# Patient Record
Sex: Female | Born: 1960 | Race: White | Hispanic: No | Marital: Single | Smoking: Former smoker
Health system: Southern US, Community
[De-identification: ages and names within clinical notes are randomized; demographics above are authoritative.]

## PROBLEM LIST (undated history)

## (undated) DIAGNOSIS — L719 Rosacea, unspecified: Secondary | ICD-10-CM

## (undated) DIAGNOSIS — G43909 Migraine, unspecified, not intractable, without status migrainosus: Secondary | ICD-10-CM

## (undated) DIAGNOSIS — Q8901 Asplenia (congenital): Secondary | ICD-10-CM

## (undated) DIAGNOSIS — R110 Nausea: Secondary | ICD-10-CM

## (undated) DIAGNOSIS — D126 Benign neoplasm of colon, unspecified: Secondary | ICD-10-CM

## (undated) DIAGNOSIS — G541 Lumbosacral plexus disorders: Secondary | ICD-10-CM

## (undated) DIAGNOSIS — IMO0001 Reserved for inherently not codable concepts without codable children: Secondary | ICD-10-CM

## (undated) DIAGNOSIS — M79671 Pain in right foot: Secondary | ICD-10-CM

## (undated) DIAGNOSIS — R9431 Abnormal electrocardiogram [ECG] [EKG]: Secondary | ICD-10-CM

## (undated) DIAGNOSIS — IMO0002 Reserved for concepts with insufficient information to code with codable children: Secondary | ICD-10-CM

## (undated) DIAGNOSIS — Z5189 Encounter for other specified aftercare: Secondary | ICD-10-CM

## (undated) DIAGNOSIS — K589 Irritable bowel syndrome without diarrhea: Secondary | ICD-10-CM

## (undated) DIAGNOSIS — M9250 Juvenile osteochondrosis of tibia and fibula, unspecified leg: Secondary | ICD-10-CM

## (undated) DIAGNOSIS — Z0000 Encounter for general adult medical examination without abnormal findings: Secondary | ICD-10-CM

## (undated) HISTORY — DX: Reserved for concepts with insufficient information to code with codable children: IMO0002

## (undated) HISTORY — PX: WRIST RECONSTRUCTION: SHX2675

## (undated) HISTORY — DX: Encounter for other specified aftercare: Z51.89

## (undated) HISTORY — DX: Reserved for inherently not codable concepts without codable children: IMO0001

## (undated) HISTORY — PX: BUNIONECTOMY: SHX129

## (undated) HISTORY — DX: Irritable bowel syndrome, unspecified: K58.9

## (undated) HISTORY — DX: Lumbosacral plexus disorders: G54.1

## (undated) HISTORY — DX: Abnormal electrocardiogram (ECG) (EKG): R94.31

## (undated) HISTORY — PX: KNEE ARTHROSCOPY: SUR90

## (undated) HISTORY — PX: FOOT SURGERY: SHX648

## (undated) HISTORY — DX: Pain in right foot: M79.671

## (undated) HISTORY — DX: Asplenia (congenital): Q89.01

## (undated) HISTORY — DX: Migraine, unspecified, not intractable, without status migrainosus: G43.909

## (undated) HISTORY — PX: TONSILLECTOMY AND ADENOIDECTOMY: SHX28

## (undated) HISTORY — DX: Benign neoplasm of colon, unspecified: D12.6

## (undated) HISTORY — PX: LAPAROSCOPY: SHX197

## (undated) HISTORY — PX: ANKLE RECONSTRUCTION: SHX1151

## (undated) HISTORY — DX: Encounter for general adult medical examination without abnormal findings: Z00.00

## (undated) HISTORY — PX: AUGMENTATION MAMMAPLASTY: SUR837

## (undated) HISTORY — DX: Rosacea, unspecified: L71.9

## (undated) HISTORY — DX: Juvenile osteochondrosis of tibia and fibula, unspecified leg: M92.50

## (undated) HISTORY — PX: ABDOMINAL HYSTERECTOMY: SHX81

## (undated) HISTORY — PX: BREAST ENHANCEMENT SURGERY: SHX7

## (undated) HISTORY — DX: Nausea: R11.0

---

## 1982-09-27 DIAGNOSIS — IMO0002 Reserved for concepts with insufficient information to code with codable children: Secondary | ICD-10-CM

## 1982-09-27 HISTORY — DX: Reserved for concepts with insufficient information to code with codable children: IMO0002

## 1998-09-27 HISTORY — PX: LAPAROSCOPIC HYSTERECTOMY: SHX1926

## 2006-10-21 ENCOUNTER — Encounter: Admission: RE | Admit: 2006-10-21 | Discharge: 2006-10-21 | Payer: Self-pay | Admitting: *Deleted

## 2006-11-02 ENCOUNTER — Ambulatory Visit (HOSPITAL_BASED_OUTPATIENT_CLINIC_OR_DEPARTMENT_OTHER): Admission: RE | Admit: 2006-11-02 | Discharge: 2006-11-02 | Payer: Self-pay | Admitting: *Deleted

## 2007-01-25 ENCOUNTER — Ambulatory Visit (HOSPITAL_BASED_OUTPATIENT_CLINIC_OR_DEPARTMENT_OTHER): Admission: RE | Admit: 2007-01-25 | Discharge: 2007-01-25 | Payer: Self-pay | Admitting: *Deleted

## 2008-04-09 ENCOUNTER — Encounter: Admission: RE | Admit: 2008-04-09 | Discharge: 2008-04-09 | Payer: Self-pay | Admitting: Family Medicine

## 2008-04-17 ENCOUNTER — Encounter: Admission: RE | Admit: 2008-04-17 | Discharge: 2008-04-17 | Payer: Self-pay | Admitting: Family Medicine

## 2009-04-11 ENCOUNTER — Encounter: Admission: RE | Admit: 2009-04-11 | Discharge: 2009-04-11 | Payer: Self-pay | Admitting: Obstetrics and Gynecology

## 2010-06-16 ENCOUNTER — Encounter: Admission: RE | Admit: 2010-06-16 | Discharge: 2010-06-16 | Payer: Self-pay | Admitting: Obstetrics and Gynecology

## 2010-10-18 ENCOUNTER — Encounter: Payer: Self-pay | Admitting: Orthopedic Surgery

## 2011-02-12 NOTE — Op Note (Signed)
Annette Snow, Annette Snow                ACCOUNT NO.:  [id number]   MEDICAL RECORD NO.:  [id number]           PATIENT TYPE:   LOCATION:                                 FACILITY:   PHYSICIAN:  Tennis Must Meyerdierks, M.D.DATE OF BIRTH:  19-Jan-1961   DATE OF PROCEDURE:  01/25/2007  DATE OF DISCHARGE:                               OPERATIVE REPORT   PREOP DIAGNOSIS:  Status post pinning of scapholunate joint, left wrist.   POSTOP DIAGNOSIS:  Status post pinning of scapholunate joint, left  wrist.   PROCEDURE:  Removal of pins left wrist.   SURGEON:  Lowell Bouton, M.D.   ANESTHESIA:  1/2% Marcaine local with sedation.   OPERATIVE FINDINGS:  The patient had two pins that were found through  separate incisions over the radial side of the wrist.   DESCRIPTION OF PROCEDURE:  Under 1/2% Marcaine local anesthesia with a  tourniquet on the left arm.  The left hand was prepped and draped in  usual fashion; and after elevating the limb the tourniquet was inflated  to 250 mmHg.  A transverse incision was made over the radial side of the  wrist; and carried through the subcutaneous tissues.  Blunt dissection  was carried down to one of the pins which was removed with a needle  holder.   A second transverse incision was made proximal to the first; and blunt  dissection was carried down through the subcutaneous tissues.  The  second pin was then removed, after localizing it with the [facility].  The  wound was irrigated with saline.  The skin was closed with 4-0 nylon.  Sterile dressings were applied followed by a volar wrist splint.  The  tourniquet was released with good circulation of the hand.  The patient  went to the recovery room awake and stable in good condition.      Lowell Bouton, M.D.  Electronically Signed     EMM/MEDQ  D:  01/25/2007  T:  01/25/2007  Job:  [id number]

## 2011-02-12 NOTE — Op Note (Signed)
Annette Snow, Annette Snow                ACCOUNT NO.:  [id number]   MEDICAL RECORD NO.:  [id number]          PATIENT TYPE:  AMB   LOCATION:  DSC                          FACILITY:  [facility]   PHYSICIAN:  Tennis Must Meyerdierks, M.D.DATE OF BIRTH:  1961-01-19   DATE OF PROCEDURE:  11/02/2006  DATE OF DISCHARGE:                               OPERATIVE REPORT   PREOPERATIVE DIAGNOSIS:  Scapholunate ligament tear, left wrist.   POSTOPERATIVE DIAGNOSIS:  Scapholunate ligament tear, left wrist.   PROCEDURE:  Diagnostic arthroscopy with open reduction and pinning of  scapholunate joint, left wrist.   SURGEON:  Lowell Bouton, M.D.   ANESTHESIA:  General augmented with axillary block.   OPERATIVE FINDINGS:  The patient had a jagged tear of the scapholunate  ligament that appeared to be subacute.  The articular surfaces at the  radiocarpal joint and the midcarpal joint were smooth.  The ligament,  itself, was not substantial enough to suture; however, scarring of the  adjacent areas for the ligament to scar down were formed.   DESCRIPTION OF PROCEDURE:  Under general anesthesia with a tourniquet on  the left arm, the left hand was prepped and draped in the usual fashion  and the index and long fingers were suspended from the arthroscopy  tower.  10 pounds of traction was applied across the wrist and 5 mL of  saline were inserted in the radiocarpal joint.  An outflow was  established at the 6U portal.  The arthroscope was then inserted at the  3/4 portal.  The arthroscope was placed in the radiocarpal joint and the  scapholunate ligament tear was easily observed.  It appeared as though  it was a subacute injury and not an old tear.  The patient was nine  weeks from her injury.  The scope was then brought through the  radiocarpal joint which showed no evidence of arthrosis.  The scope was  brought through the scapholunate tear to the midcarpal joint to see the  proximal capitate and  the joint surfaces were smooth.   The arthroscopy was then discontinued and the hand was removed from this  tower.  It was exsanguinated and the tourniquet was inflated to 250  mmHg.  A longitudinal incision was then made between the third and  fourth dorsal compartments on the wrist.  Blunt dissection was carried  through the subcutaneous tissues and bleeding points were coagulated.  The third dorsal compartment was released and the joint was entered  through an arthrotomy at the radiocarpal joint.  A Freer elevator was  inserted in the joint and with distraction, the scapholunate tear was  easily observed.  The capitate was migrated proximally.  A 4/5 K-wire  was placed in the lunate, used as a joystick, and the scapholunate  interval was reduced.  Looking at the bone, the ligament was felt to be  appropriately aligned and was too small really to use any sutures on  both sides.  The bone anchors that were available were quite large and  it was not felt that they would be of benefit.  A  4/5 K-wire x2 was  placed percutaneously across the scapholunate joint holding the joint  reduced.  This allowed the ligament to line up from the lunate to the  scaphoid appropriately and so a 4/5 K-wires used to roughen up the  surface of the scaphoid at the attachment of the ligament.  This was  done to allow scarring there to have the ligament to heal down to the  scaphoid.  X-rays showed good alignment and the pins were cut beneath  the skin.   The wound was then irrigated copiously with saline and the dorsal  capsular tissue was closed with 4-0 Vicryl and the third dorsal  compartment was closed with 4-0 Vicryl.  The subcu was closed with 4-0  Vicryl.  A vessel loop  drain was left in for drainage.  The skin was closed with a 3-0  subcuticular Prolene.  Steri-Strips were applied followed by sterile  dressings and volar and dorsal splint.  The patient had the tourniquet  released with good  circulation of her hand.  She went to the recovery  room awake and stable in good condition.      Lowell Bouton, M.D.  Electronically Signed     EMM/MEDQ  D:  11/02/2006  T:  11/02/2006  Job:  [id number]

## 2011-05-26 ENCOUNTER — Encounter: Payer: Self-pay | Admitting: Internal Medicine

## 2011-05-26 ENCOUNTER — Other Ambulatory Visit: Payer: Self-pay | Admitting: Obstetrics & Gynecology

## 2011-05-26 DIAGNOSIS — Z780 Asymptomatic menopausal state: Secondary | ICD-10-CM

## 2011-06-16 ENCOUNTER — Ambulatory Visit (AMBULATORY_SURGERY_CENTER): Payer: Managed Care, Other (non HMO) | Admitting: *Deleted

## 2011-06-16 VITALS — Ht 64.0 in | Wt 125.0 lb

## 2011-06-16 DIAGNOSIS — Z1211 Encounter for screening for malignant neoplasm of colon: Secondary | ICD-10-CM

## 2011-06-16 MED ORDER — PEG-KCL-NACL-NASULF-NA ASC-C 100 G PO SOLR: ORAL | Status: DC

## 2011-06-16 NOTE — Progress Notes (Signed)
[name] states she's had extensive laparoscopies to clean endometriosis off the outside of her bowel and other internal organs and that she also had a colon to clean endometriosis of the inside of her large bowel.

## 2011-06-28 ENCOUNTER — Other Ambulatory Visit: Payer: Self-pay | Admitting: Obstetrics & Gynecology

## 2011-06-28 DIAGNOSIS — Z1231 Encounter for screening mammogram for malignant neoplasm of breast: Secondary | ICD-10-CM

## 2011-06-30 ENCOUNTER — Ambulatory Visit (AMBULATORY_SURGERY_CENTER): Payer: Managed Care, Other (non HMO) | Admitting: Internal Medicine

## 2011-06-30 ENCOUNTER — Encounter: Payer: Self-pay | Admitting: Internal Medicine

## 2011-06-30 VITALS — HR 77 | Temp 97.0°F | Resp 18 | Ht 64.0 in | Wt 125.0 lb

## 2011-06-30 DIAGNOSIS — D126 Benign neoplasm of colon, unspecified: Secondary | ICD-10-CM

## 2011-06-30 DIAGNOSIS — Z1211 Encounter for screening for malignant neoplasm of colon: Secondary | ICD-10-CM

## 2011-06-30 DIAGNOSIS — K635 Polyp of colon: Secondary | ICD-10-CM

## 2011-06-30 LAB — HM COLONOSCOPY

## 2011-06-30 MED ORDER — SODIUM CHLORIDE 0.9 % IV SOLN: 500.0000 mL | INTRAVENOUS | Status: DC

## 2011-06-30 NOTE — Patient Instructions (Signed)
Please refer to your blue and neon green sheets for instructions regarding diet and activity for the rest of today.  You may resume your medications as you would normally take them.   You will receive a letter in the mail in about two weeks regarding the results of your biopsies taken today.  Diverticulosis Diverticulosis is a common condition that develops when small pouches (diverticula) form in the wall of the colon. The risk of diverticulosis increases with age. It happens more often in people who eat a low-fiber diet. Most individuals with diverticulosis have no symptoms. Those individuals with symptoms usually experience belly (abdominal) pain, constipation, or loose stools (diarrhea). HOME CARE INSTRUCTIONS  Increase the amount of fiber in your diet as directed by your caregiver or dietician. This may reduce symptoms of diverticulosis.   Your caregiver may recommend taking a dietary fiber supplement.   Drink at least 6 to 8 glasses of water each day to prevent constipation.   Try not to strain when you have a bowel movement.   Your caregiver may recommend avoiding nuts and seeds to prevent complications, although this is still an uncertain benefit.   Only take over-the-counter or prescription medicines for pain, discomfort, or fever as directed by your caregiver.  FOODS HAVING HIGH FIBER CONTENT INCLUDE:  Fruits. Apple, peach, pear, tangerine, raisins, prunes.   Vegetables. Brussels sprouts, asparagus, broccoli, cabbage, carrot, cauliflower, romaine lettuce, spinach, summer squash, tomato, winter squash, zucchini.   Starchy Vegetables. Baked beans, kidney beans, lima beans, split peas, lentils, potatoes (with skin).   Grains. Whole wheat bread, brown rice, bran flake cereal, plain oatmeal, white rice, shredded wheat, bran muffins.  SEEK IMMEDIATE MEDICAL CARE IF:  You develop increasing pain or severe bloating.   You have an increased oral temperature, not controlled by  medicine.   You develop vomiting or bowel movements that are bloody or black.  Document Released: 06/10/2004 Document Re-Released: 03/03/2010 [facility] Patient Information 2011 [place], [place].  Polyps, Colon  A polyp is extra tissue that grows inside your body. Colon polyps grow in the large intestine. The large intestine, also called the colon, is part of your digestive system. It is a long, hollow tube at the end of your digestive tract where your body makes and stores stool. Most polyps are not dangerous. They are benign. This means they are not cancerous. But over time, some types of polyps can turn into cancer. Polyps that are smaller than a pea are usually not harmful. But larger polyps could someday become or may already be cancerous. To be safe, doctors remove all polyps and test them.  WHO GETS POLYPS? Anyone can get polyps, but certain people are more likely than others. You may have a greater chance of getting polyps if:  You are over 50.   You have had polyps before.   Someone in your family has had polyps.   Someone in your family has had cancer of the large intestine.   Find out if someone in your family has had polyps. You may also be more likely to get polyps if you:   Eat a lot of fatty foods   Smoke   Drink alcohol   Do not exercise  Eat too much  SYMPTOMS Most small polyps do not cause symptoms. People often do not know they have one until their caregiver finds it during a regular checkup or while testing them for something else. Some people do have symptoms like these:  Bleeding from the anus.  You might notice blood on your underwear or on toilet paper after you have had a bowel movement.   Constipation or diarrhea that lasts more than a week.   Blood in the stool. Blood can make stool look black or it can show up as red streaks in the stool.  If you have any of these symptoms, see your caregiver. HOW DOES THE DOCTOR TEST FOR POLYPS? The doctor can use four  tests to check for polyps:  Digital rectal exam. The caregiver wears gloves and checks your rectum (the last part of the large intestine) to see if it feels normal. This test would find polyps only in the rectum. Your caregiver may need to do one of the other tests listed below to find polyps higher up in the intestine.   Barium enema. The caregiver puts a liquid called barium into your rectum before taking x-rays of your large intestine. Barium makes your intestine look white in the pictures. Polyps are dark, so they are easy to see.   Sigmoidoscopy. With this test, the caregiver can see inside your large intestine. A thin flexible tube is placed into your rectum. The device is called a sigmoidoscope, which has a light and a tiny video camera in it. The caregiver uses the sigmoidoscope to look at the last third of your large intestine.   Colonoscopy. This test is like sigmoidoscopy, but the caregiver looks at all of the large intestine. It usually requires sedation. This is the most common method for finding and removing polyps.  TREATMENT  The caregiver will remove the polyp during sigmoidoscopy or colonoscopy. The polyp is then tested for cancer.   If you have had polyps, your caregiver may want you to get tested regularly in the future.  PREVENTION There is not one sure way to prevent polyps. You might be able to lower your risk of getting them if you:  Eat more fruits and vegetables and less fatty food.   Do not smoke.   Avoid alcohol.   Exercise every day.   Lose weight if you are overweight.   Eating more calcium and folate can also lower your risk of getting polyps. Some foods that are rich in calcium are milk, cheese, and broccoli. Some foods that are rich in folate are chickpeas, kidney beans, and spinach.   Aspirin might help prevent polyps. Studies are under way.  Document Released: 06/09/2004 Document Re-Released: 03/03/2010 [facility] Patient Information 2011 [place],  [place].

## 2011-07-01 ENCOUNTER — Emergency Department (HOSPITAL_BASED_OUTPATIENT_CLINIC_OR_DEPARTMENT_OTHER): Payer: Managed Care, Other (non HMO)

## 2011-07-01 ENCOUNTER — Inpatient Hospital Stay (HOSPITAL_COMMUNITY)
Admission: EM | Admit: 2011-07-01 | Discharge: 2011-07-09 | DRG: 799 | Disposition: A | Discharge status: Home / Self Care | Payer: Managed Care, Other (non HMO) | Source: Ambulatory Visit | Attending: Surgery | Admitting: Surgery

## 2011-07-01 ENCOUNTER — Telehealth: Payer: Self-pay | Admitting: *Deleted

## 2011-07-01 ENCOUNTER — Other Ambulatory Visit (INDEPENDENT_AMBULATORY_CARE_PROVIDER_SITE_OTHER): Payer: Self-pay | Admitting: Surgery

## 2011-07-01 ENCOUNTER — Emergency Department (INDEPENDENT_AMBULATORY_CARE_PROVIDER_SITE_OTHER): Payer: Managed Care, Other (non HMO)

## 2011-07-01 ENCOUNTER — Encounter (HOSPITAL_BASED_OUTPATIENT_CLINIC_OR_DEPARTMENT_OTHER): Payer: Self-pay | Admitting: *Deleted

## 2011-07-01 ENCOUNTER — Telehealth: Payer: Self-pay | Admitting: Internal Medicine

## 2011-07-01 ENCOUNTER — Emergency Department (HOSPITAL_BASED_OUTPATIENT_CLINIC_OR_DEPARTMENT_OTHER)
Admission: EM | Admit: 2011-07-01 | Discharge: 2011-07-01 | Disposition: A | Discharge status: Other Acute Inpatient Hospital | Payer: Managed Care, Other (non HMO) | Source: Home / Self Care | Attending: Emergency Medicine | Admitting: Emergency Medicine

## 2011-07-01 DIAGNOSIS — D7389 Other diseases of spleen: Principal | ICD-10-CM | POA: Diagnosis present

## 2011-07-01 DIAGNOSIS — K661 Hemoperitoneum: Secondary | ICD-10-CM | POA: Diagnosis present

## 2011-07-01 DIAGNOSIS — X58XXXA Exposure to other specified factors, initial encounter: Secondary | ICD-10-CM | POA: Insufficient documentation

## 2011-07-01 DIAGNOSIS — K589 Irritable bowel syndrome without diarrhea: Secondary | ICD-10-CM | POA: Insufficient documentation

## 2011-07-01 DIAGNOSIS — E876 Hypokalemia: Secondary | ICD-10-CM | POA: Diagnosis not present

## 2011-07-01 DIAGNOSIS — S3609XA Other injury of spleen, initial encounter: Secondary | ICD-10-CM | POA: Insufficient documentation

## 2011-07-01 DIAGNOSIS — Y836 Removal of other organ (partial) (total) as the cause of abnormal reaction of the patient, or of later complication, without mention of misadventure at the time of the procedure: Secondary | ICD-10-CM | POA: Diagnosis present

## 2011-07-01 DIAGNOSIS — S36039A Unspecified laceration of spleen, initial encounter: Secondary | ICD-10-CM

## 2011-07-01 DIAGNOSIS — K929 Disease of digestive system, unspecified: Secondary | ICD-10-CM | POA: Diagnosis not present

## 2011-07-01 DIAGNOSIS — S3600XA Unspecified injury of spleen, initial encounter: Secondary | ICD-10-CM

## 2011-07-01 DIAGNOSIS — R579 Shock, unspecified: Secondary | ICD-10-CM

## 2011-07-01 DIAGNOSIS — Z87891 Personal history of nicotine dependence: Secondary | ICD-10-CM

## 2011-07-01 DIAGNOSIS — R578 Other shock: Secondary | ICD-10-CM | POA: Diagnosis present

## 2011-07-01 DIAGNOSIS — D62 Acute posthemorrhagic anemia: Secondary | ICD-10-CM | POA: Diagnosis present

## 2011-07-01 DIAGNOSIS — Y9210 Unspecified residential institution as the place of occurrence of the external cause: Secondary | ICD-10-CM | POA: Diagnosis present

## 2011-07-01 DIAGNOSIS — G43909 Migraine, unspecified, not intractable, without status migrainosus: Secondary | ICD-10-CM | POA: Diagnosis present

## 2011-07-01 DIAGNOSIS — R109 Unspecified abdominal pain: Secondary | ICD-10-CM

## 2011-07-01 DIAGNOSIS — Z23 Encounter for immunization: Secondary | ICD-10-CM

## 2011-07-01 DIAGNOSIS — K560 Paralytic ileus: Secondary | ICD-10-CM | POA: Diagnosis not present

## 2011-07-01 DIAGNOSIS — N809 Endometriosis, unspecified: Secondary | ICD-10-CM | POA: Diagnosis present

## 2011-07-01 DIAGNOSIS — Z860100 Personal history of colon polyps, unspecified: Secondary | ICD-10-CM

## 2011-07-01 HISTORY — PX: SPLENECTOMY: SUR1306

## 2011-07-01 LAB — CBC
HCT: 31 % — ABNORMAL LOW (ref 36.0–46.0)
HCT: 23.2 % — ABNORMAL LOW (ref 36.0–46.0)
Hemoglobin: 10.8 g/dL — ABNORMAL LOW (ref 12.0–15.0)
Hemoglobin: 8.1 g/dL — ABNORMAL LOW (ref 12.0–15.0)
MCH: 31.5 pg (ref 26.0–34.0)
MCH: 31.4 pg (ref 26.0–34.0)
MCHC: 34.8 g/dL (ref 30.0–36.0)
MCV: 90.4 fL (ref 78.0–100.0)
MCV: 89.9 fL (ref 78.0–100.0)
Platelets: 156 K/uL (ref 150–400)
RBC: 3.43 MIL/uL — ABNORMAL LOW (ref 3.87–5.11)
RBC: 2.58 MIL/uL — ABNORMAL LOW (ref 3.87–5.11)
RDW: 11.2 % — ABNORMAL LOW (ref 11.5–15.5)
WBC: 9.3 K/uL (ref 4.0–10.5)

## 2011-07-01 LAB — DIFFERENTIAL
Basophils Absolute: 0 10*3/uL (ref 0.0–0.1)
Lymphs Abs: 1.7 10*3/uL (ref 0.7–4.0)
Monocytes Relative: 5 % (ref 3–12)
Neutro Abs: 7 10*3/uL (ref 1.7–7.7)
Neutrophils Relative %: 75 % (ref 43–77)

## 2011-07-01 LAB — COMPREHENSIVE METABOLIC PANEL
ALT: 13 U/L (ref 0–35)
AST: 14 U/L (ref 0–37)
Albumin: 3.3 g/dL — ABNORMAL LOW (ref 3.5–5.2)
Alkaline Phosphatase: 41 U/L (ref 39–117)
CO2: 24 mEq/L (ref 19–32)
Chloride: 105 mEq/L (ref 96–112)
GFR calc non Af Amer: >90 mL/min (ref >90)
Potassium: 3.3 mEq/L — ABNORMAL LOW (ref 3.5–5.1)
Sodium: 138 mEq/L (ref 135–145)
Total Bilirubin: 0.8 mg/dL (ref 0.3–1.2)

## 2011-07-01 LAB — POCT I-STAT 4, (NA,K, GLUC, HGB,HCT)
Glucose, Bld: 167 mg/dL — ABNORMAL HIGH (ref 70–99)
HCT: 34 % — ABNORMAL LOW (ref 36.0–46.0)

## 2011-07-01 MED ORDER — HYDROMORPHONE HCL 1 MG/ML IJ SOLN
1.0000 mg | Freq: Once | INTRAMUSCULAR | Status: AC
  Administered 2011-07-01: 1 mg via INTRAVENOUS

## 2011-07-01 MED ORDER — FENTANYL CITRATE 0.05 MG/ML IJ SOLN
50.0000 ug | Freq: Once | INTRAMUSCULAR | Status: AC
  Administered 2011-07-01: 19:00:00 via INTRAVENOUS

## 2011-07-01 MED ORDER — PIPERACILLIN-TAZOBACTAM 3.375 G IVPB
INTRAVENOUS | Status: AC
  Filled 2011-07-01: qty 50

## 2011-07-01 MED ORDER — SODIUM CHLORIDE 0.9 % IV BOLUS (SEPSIS)
1000.0000 mL | Freq: Once | INTRAVENOUS | Status: AC
  Administered 2011-07-01: 1000 mL via INTRAVENOUS

## 2011-07-01 MED ORDER — PIPERACILLIN-TAZOBACTAM 3.375 G IVPB
3.3750 g | Freq: Once | INTRAVENOUS | Status: DC
  Administered 2011-07-01: 3.375 g via INTRAVENOUS

## 2011-07-01 MED ORDER — ONDANSETRON HCL 4 MG/2ML IJ SOLN
4.0000 mg | Freq: Once | INTRAMUSCULAR | Status: AC
  Administered 2011-07-01: 4 mg via INTRAVENOUS

## 2011-07-01 MED ORDER — IOHEXOL 300 MG/ML  SOLN: 100.0000 mL | Freq: Once | INTRAMUSCULAR | Status: DC | PRN

## 2011-07-01 MED ORDER — HYDROMORPHONE HCL 1 MG/ML IJ SOLN
INTRAMUSCULAR | Status: AC
  Filled 2011-07-01: qty 1

## 2011-07-01 MED ORDER — FENTANYL CITRATE 0.05 MG/ML IJ SOLN
INTRAMUSCULAR | Status: AC
  Filled 2011-07-01: qty 2

## 2011-07-01 MED ORDER — ONDANSETRON HCL 4 MG/2ML IJ SOLN
INTRAMUSCULAR | Status: AC
  Filled 2011-07-01: qty 2

## 2011-07-01 MED ORDER — ONDANSETRON HCL 4 MG/2ML IJ SOLN
INTRAMUSCULAR | Status: AC
  Administered 2011-07-01: 4 mg
  Filled 2011-07-01: qty 2

## 2011-07-01 NOTE — ED Notes (Signed)
Decreased bp md aware and emergency blood ordered, and started

## 2011-07-01 NOTE — ED Notes (Signed)
Pt states colonoscopy done yesterday, today sudden onset of abd pain and left shoulder pain

## 2011-07-01 NOTE — ED Provider Notes (Signed)
History     CSN: [id number] Arrival date & time: 07/01/2011  5:27 PM  Chief Complaint  Patient presents with  . Abdominal Pain    (Consider location/radiation/quality/duration/timing/severity/associated sxs/prior treatment) Patient is a 50 y.o. female presenting with abdominal pain. The history is provided by the patient. A language interpreter was used.  Abdominal Pain The primary symptoms of the illness include abdominal pain and nausea. The current episode started 6 to 12 hours ago. The onset of the illness was sudden. The problem has been gradually worsening.  Associated with: surgery. The patient states that she believes she is currently not pregnant. The patient has not had a change in bowel habit. Risk factors for an acute abdominal problem include a history of abdominal surgery. Additional symptoms associated with the illness include chills. Significant associated medical issues do not include diabetes, gallstones or liver disease.  Pt complains ofsudden onset around 4 am of pain in left abdomen that radiates to left shoulder.  Pt had a colonoscopy done yesterday by Dr. Juanda Chance.  Pt reports colonoscopy was routine   Past Medical History  Diagnosis Date  . Ulcer 1984    petic and duodenal  . IBS (irritable bowel syndrome)   . Migraines   . Hemorrhoids     Past Surgical History  Procedure Date  . Laparoscopic hysterectomy 2000    total for extensive endometriosis  . Bunionectomy     bilateral  . Ankle reconstruction   . Knee arthroscopy     bilateral  3 on left 2 on right  . Breast enhancement surgery   . Wrist reconstruction     left twice  . Foot surgery     left twice  . Tonsillectomy and adenoidectomy   . Laparoscopy     multiple times for endometriosis on all organs  . Abdominal hysterectomy     History reviewed. No pertinent family history.  History  Substance Use Topics  . Smoking status: Former Games developer  . Smokeless tobacco: Never Used  . Alcohol Use:  3.6 oz/week    6 Cans of beer per week    OB History    Grav Para Term Preterm Abortions TAB SAB Ect Mult Living                  Review of Systems  Constitutional: Positive for chills.  Gastrointestinal: Positive for nausea, abdominal pain and abdominal distention.  All other systems reviewed and are negative.    Allergies  Nucynta  Home Medications   Current Outpatient Rx  Name Route Sig Dispense Refill  . VIVELLE-DOT 0.1 MG/24HR TD PTTW Transdermal Place 1 patch onto the skin 2 (two) times a week.      BP 94/76  Pulse 76  Temp(Src) 97.5 F (36.4 C) (Oral)  Resp 20  Ht 5\' 6"  (1.676 m)  Wt 124 lb (56.246 kg)  BMI 20.01 kg/m2  SpO2 100%  Physical Exam  Nursing note and vitals reviewed. Constitutional: She is oriented to person, place, and time. She appears well-developed and well-nourished.  HENT:  Head: Normocephalic and atraumatic.  Eyes: Pupils are equal, round, and reactive to light.  Neck: Normal range of motion.  Cardiovascular: Normal rate.   Pulmonary/Chest: Effort normal and breath sounds normal.  Abdominal: Soft. There is tenderness. There is rebound and guarding.  Musculoskeletal: Normal range of motion.  Neurological: She is alert and oriented to person, place, and time.  Skin: Skin is warm.  Psychiatric: She has a normal mood and  affect.    ED Course  CRITICAL CARE Performed by: Langston Masker Authorized by: Langston Masker Total critical care time: 45 minutes Critical care was necessary to treat or prevent imminent or life-threatening deterioration of the following conditions: shock (internal hemmorhage). Critical care was time spent personally by me on the following activities: discussions with consultants, development of treatment plan with patient or surrogate, ordering and performing treatments and interventions, examination of patient, ordering and review of radiographic studies, re-evaluation of patient's condition, review of old charts,  ordering and review of laboratory studies, pulse oximetry, evaluation of patient's response to treatment and obtaining history from patient or surrogate. Comments: Dr. Fredricka Bonine notified of pt's condition and we were at bedside during pt's stay in ED.   (including critical care time)  Labs Reviewed  CBC - Abnormal; Notable for the following:    RBC 3.43 (*)    Hemoglobin 10.8 (*)    HCT 31.0 (*)    RDW 11.2 (*)    All other components within normal limits  COMPREHENSIVE METABOLIC PANEL - Abnormal; Notable for the following:    Potassium 3.3 (*)    Glucose, Bld 155 (*)    Total Protein 5.4 (*)    Albumin 3.3 (*)    All other components within normal limits  DIFFERENTIAL  TYPE AND SCREEN  PREPARE RBC (CROSSMATCH)  PREPARE FRESH FROZEN PLASMA   Ct Abdomen Pelvis W Contrast  07/01/2011  *RADIOLOGY REPORT*  Clinical Data: Today, now with sudden onset of abdominal pain, hypertension  CT ABDOMEN AND PELVIS WITH CONTRAST  Technique:  Multidetector CT imaging of the abdomen and pelvis was performed following the standard protocol during bolus administration of intravenous contrast.  Contrast:  100 ml Omnipaque-300  Comparison: None.  Findings:  There is a large amount of fluid within the abdominal cavity which demonstrates Hounsfield units compatible with blood.  This finding is associated with apparent disruption of the anterior aspect of the splenic capsule (image 22, series 2).  Serpiginous high density material is seen about the splenic hilum which accumulates on the acquired delayed images (image 22, series 2 and image 7, series 7), compatible with active contrast extravasation.  Normal hepatic contour. Calcified hepatic lesion compatible with prior granulomatous infection.  Normal gallbladder.  No intrahepatic biliary duct dilatation.  Patent vasculature is patent. Incidental note is made of several splenic granulomas, the sequela of prior granulomatous infection.  There is symmetric enhancement  excretion of the bilateral kidneys. No evidence of urinary obstruction.  No retroperitoneal hematoma. The bilateral adrenal glands are normal.  Normal pancreas.  The bowel is normal in course and caliber without wall thickening or evidence of obstruction.  The appendix is not definitively identified.  No pneumoperitoneum, pneumatosis or portal venous gas. Normal caliber abdominal aorta.  No retroperitoneal or definite evidence of minute mesenteric, pelvic or inguinal lymphadenopathy.  Limited visualization of the lower thorax demonstrates a granuloma loss within the right and left lower lobes.  Bibasilar ground-glass atelectasis.  No pleural effusions.  No focal airspace opacities. Normal heart size.  No pericardial effusion.  No acute aggressive osseous abnormalities.  L4 - L5 and L5 - S1 DDD.  Post bilateral breast augmentation.  IMPRESSION:  Splenic laceration with moderate to large amount of hemoperitoneum and contrast pooling within the splenic hilum compatible with active extravasation.  Above findings discussed with Sophia, PA at 18 33.  Original Report Authenticated By: Waynard Reeds, M.D.     1. Splenic laceration  2. Hemoperitoneum       MDM  Radiologist reports pt is bleeding from splenic laceration.  Dr. Fredricka Bonine spoke with Dr. Michaell Cowing General Surgeon who advised transfer pt to [facility] OR.  I spoke with Dr. Marina Goodell on call for Dr. Juanda Chance.   Pt transferred by [facility].          Langston Masker, [place] 07/01/11 [phone]

## 2011-07-01 NOTE — ED Notes (Signed)
Called [facility]--they had no truck for transfer--told to call 911-i did and a truck got here within 15 minutes

## 2011-07-01 NOTE — ED Notes (Signed)
[facility] here for transport , waiting for location of transport

## 2011-07-01 NOTE — Telephone Encounter (Signed)
Spoke with patient;s husband about her symptoms. He said that the paramedics just arrived to her house. Dr. Juanda Chance spoke with husband and advised to go to [facility] ER. Husband verbalized understanding.

## 2011-07-01 NOTE — ED Provider Notes (Signed)
History     CSN: [id number] Arrival date & time: 07/01/2011  5:27 PM  Chief Complaint  Patient presents with  . Abdominal Pain    (Consider location/radiation/quality/duration/timing/severity/associated sxs/prior treatment) HPI  Past Medical History  Diagnosis Date  . Ulcer 1984    petic and duodenal  . IBS (irritable bowel syndrome)   . Migraines   . Hemorrhoids     Past Surgical History  Procedure Date  . Laparoscopic hysterectomy 2000    total for extensive endometriosis  . Bunionectomy     bilateral  . Ankle reconstruction   . Knee arthroscopy     bilateral  3 on left 2 on right  . Breast enhancement surgery   . Wrist reconstruction     left twice  . Foot surgery     left twice  . Tonsillectomy and adenoidectomy   . Laparoscopy     multiple times for endometriosis on all organs  . Abdominal hysterectomy     History reviewed. No pertinent family history.  History  Substance Use Topics  . Smoking status: Former Games developer  . Smokeless tobacco: Never Used  . Alcohol Use: 3.6 oz/week    6 Cans of beer per week    OB History    Grav Para Term Preterm Abortions TAB SAB Ect Mult Living                  Review of Systems  Allergies  Nucynta  Home Medications   Current Outpatient Rx  Name Route Sig Dispense Refill  . VIVELLE-DOT 0.1 MG/24HR TD PTTW Transdermal Place 1 patch onto the skin 2 (two) times a week.      BP 94/76  Pulse 76  Temp(Src) 97.5 F (36.4 C) (Oral)  Resp 20  Ht 5\' 6"  (1.676 m)  Wt 124 lb (56.246 kg)  BMI 20.01 kg/m2  SpO2 100%  Physical Exam  ED Course  Procedures (including critical care time)  Labs Reviewed  CBC - Abnormal; Notable for the following:    RBC 3.43 (*)    Hemoglobin 10.8 (*)    HCT 31.0 (*)    RDW 11.2 (*)    All other components within normal limits  COMPREHENSIVE METABOLIC PANEL - Abnormal; Notable for the following:    Potassium 3.3 (*)    Glucose, Bld 155 (*)    Total Protein 5.4 (*)    Albumin 3.3 (*)    All other components within normal limits  DIFFERENTIAL   No results found.   No diagnosis found.    MDM  The patient was seen by me in 2 to her hypotension and severe abdominal pain. On CT scan she appears to have a splenic laceration with hemoperitoneum and pooling of contrast in the spleen such as suggestive of a fairly brisk bleeding. Her blood pressure remained hypotensive although she is not tachycardic and she is awake alert and oriented. Because of the suspected large blood loss we will have to give emergency transfusion of on and crossed blood pending emergent transfer to surgery at [facility]. [facility] awaiting return call from the surgeon to give information of the case, however we will need to expedite transport emergently. The patient and her spouse have been informed of the diagnosis and treatment plan and state their understanding of and agreement with that.        Felisa Bonier, MD 07/01/11 [phone]

## 2011-07-01 NOTE — Telephone Encounter (Signed)
Left mess. On voicemail with id

## 2011-07-01 NOTE — ED Notes (Signed)
repaging dr. Alexandria Lodge

## 2011-07-02 LAB — BASIC METABOLIC PANEL
CO2: 23 mEq/L (ref 19–32)
Chloride: 112 mEq/L (ref 96–112)
Glucose, Bld: 152 mg/dL — ABNORMAL HIGH (ref 70–99)
Potassium: 4.3 mEq/L (ref 3.5–5.1)
Sodium: 139 mEq/L (ref 135–145)

## 2011-07-02 LAB — CROSSMATCH: Unit division: 0

## 2011-07-02 LAB — CBC
HCT: 36.2 % (ref 36.0–46.0)
Hemoglobin: 13 g/dL (ref 12.0–15.0)
MCH: 30.9 pg (ref 26.0–34.0)
MCH: 31.6 pg (ref 26.0–34.0)
MCHC: 35.9 g/dL (ref 30.0–36.0)
MCV: 86 fL (ref 78.0–100.0)
MCV: 87.6 fL (ref 78.0–100.0)
Platelets: 84 10*3/uL — ABNORMAL LOW (ref 150–400)
RBC: 4.21 MIL/uL (ref 3.87–5.11)

## 2011-07-02 LAB — PROTIME-INR: Prothrombin Time: 17.1 seconds — ABNORMAL HIGH (ref 11.6–15.2)

## 2011-07-02 LAB — HEMOGLOBIN AND HEMATOCRIT, BLOOD: HCT: 35.3 % — ABNORMAL LOW (ref 36.0–46.0)

## 2011-07-03 LAB — CBC
HCT: 29.8 % — ABNORMAL LOW (ref 36.0–46.0)
Hemoglobin: 10.5 g/dL — ABNORMAL LOW (ref 12.0–15.0)
RBC: 3.41 MIL/uL — ABNORMAL LOW (ref 3.87–5.11)
RDW: 14.3 % (ref 11.5–15.5)
WBC: 15.8 10*3/uL — ABNORMAL HIGH (ref 4.0–10.5)

## 2011-07-03 LAB — BASIC METABOLIC PANEL
BUN: 5 mg/dL — ABNORMAL LOW (ref 6–23)
CO2: 29 mEq/L (ref 19–32)
Chloride: 106 mEq/L (ref 96–112)
GFR calc Af Amer: >90 mL/min (ref >90)
Glucose, Bld: 132 mg/dL — ABNORMAL HIGH (ref 70–99)
Potassium: 3.8 mEq/L (ref 3.5–5.1)

## 2011-07-05 ENCOUNTER — Encounter: Payer: Self-pay | Admitting: Internal Medicine

## 2011-07-05 LAB — CBC
HCT: 26.1 % — ABNORMAL LOW (ref 36.0–46.0)
Hemoglobin: 9.1 g/dL — ABNORMAL LOW (ref 12.0–15.0)
MCHC: 34.9 g/dL (ref 30.0–36.0)

## 2011-07-05 LAB — PREPARE FRESH FROZEN PLASMA
Unit division: 0
Unit division: 0

## 2011-07-06 LAB — COMPREHENSIVE METABOLIC PANEL
CO2: 29 mEq/L (ref 19–32)
Calcium: 7.9 mg/dL — ABNORMAL LOW (ref 8.4–10.5)
Creatinine, Ser: <0.47 mg/dL — ABNORMAL LOW (ref 0.50–1.10)
Glucose, Bld: 103 mg/dL — ABNORMAL HIGH (ref 70–99)

## 2011-07-06 LAB — CBC
Hemoglobin: 8.8 g/dL — ABNORMAL LOW (ref 12.0–15.0)
MCH: 30.9 pg (ref 26.0–34.0)
MCV: 89.5 fL (ref 78.0–100.0)
Platelets: 240 10*3/uL (ref 150–400)
RBC: 2.85 MIL/uL — ABNORMAL LOW (ref 3.87–5.11)

## 2011-07-06 LAB — MAGNESIUM: Magnesium: 1.8 mg/dL (ref 1.5–2.5)

## 2011-07-06 NOTE — Op Note (Signed)
NAMEBECKETT, HICKMON                ACCOUNT NO.:  [id number]  MEDICAL RECORD NO.:  [id number]  LOCATION:  [facility]                         FACILITY:  [facility]  PHYSICIAN:  Maisie Fus A. Lander Eslick, M.D.DATE OF BIRTH:  Jul 26, 1961  DATE OF PROCEDURE:  07/01/2011 DATE OF DISCHARGE:                              OPERATIVE REPORT   PREOPERATIVE DIAGNOSES: 1. Hemoperitoneum shock. 2. Ruptured spleen.  POSTOPERATIVE DIAGNOSES: 1. Hemoperitoneum shock. 2. Ruptured spleen.  PROCEDURE:  Splenectomy.  SURGEON:  Maisie Fus A. Tita Terhaar, MD  ANESTHESIA:  General endotracheal anesthesia.  ESTIMATED BLOOD LOSS:  1500 mL.  SPECIMEN:  Spleen to Pathology.  DRAINS:  None.  INDICATIONS FOR PROCEDURE:  The patient is a 50 year old female who underwent a colonoscopy yesterday with Dr. Lina Sar as a screening colonoscopy.  She underwent 2 polypectomies.  She developed abdominal pain today and abdominal distention.  She went to the [facility] Emergency Room.  CT scan showed hemoperitoneum and splenic rupture.  She was stabilized and transferred to [facility] for surgical evaluation.  Upon evaluation, her CT scan was reviewed.  She was examined and I felt she had splenic rupture.  She had persistent hypotension, requiring blood products and saline.  I discussed this with the patient's family and felt that the splenectomy would be the next appropriate step and she would not be well managed nonoperatively due to hypotension.  Risk of bleeding, infection, pancreatic injury, abscess, intraabdominal abscess, bowel injury, diaphragmatic injury, injury to the liver and stomach, further bleeding requiring more surgery, wound complications, hernia, the need for open procedure.  They understood and agreed to proceed.  DESCRIPTION OF PROCEDURE:  The patient was brought to the operating room.  After induction of general anesthesia, the abdomen was prepped and draped in sterile fashion.  Time-out was done.  She  received 1 gram of Ancef.  Midline incision was used.  Dissection was carried down to the abdominal cavity.  Upon entering the abdominal cavity, there was about 1000 mL of blood in the abdomen.  This was suctioned out.  I packed the left upper quadrant.  Once we packed, we got the bleeding in control and I was able to roll the spleen up.  The entire capsule had ruptured off the spleen and there was nothing but a large raw surface to it.  This was mobilized up in the field.  Using Kelly clamps, I clamped the pedicle of the splenic vessels and divided the spleen away from this.  The pancreas was preserved and not injured.  It was easily seen. I then oversewed the vessels with 2-0 silk ties.  The decapsulated spleen was passed off the field.  We now irrigated out the left upper quadrant.  Surgicel SNoW was placed on the splenic bed.  We irrigated the abdomen with 4 L of saline.  This was all suctioned out.  The colon appeared uninjured.  The ascending, transverse, descending colons all appeared normal without perforation.  Small bowel was normal upon examination.  Liver was normal.  Stomach was normal.  NG tube was positioned in the stomach.  At this point in time, hemostasis was achieved.  I closed the fascia after an adequate  sponge count with #1 running PDS.  Staples were used to close the skin.  All final counts of sponge, needle, and instruments were found to be correct at this portion of the case.  The patient was awakened, extubated, and taken to the recovery in satisfactory condition.     Monserrate Blaschke A. Reita Shindler, M.D.     TAC/MEDQ  D:  07/01/2011  T:  07/01/2011  Job:  [id number]  cc:   Hedwig Morton. Juanda Chance, MD  Electronically Signed by Harriette Bouillon M.D. on 07/06/2011 07:57:59 AM

## 2011-07-07 LAB — CBC
Platelets: 340 10*3/uL (ref 150–400)
RBC: 3.2 MIL/uL — ABNORMAL LOW (ref 3.87–5.11)
WBC: 9.3 10*3/uL (ref 4.0–10.5)

## 2011-07-07 LAB — TYPE AND SCREEN
Antibody Screen: NEGATIVE
Unit division: 0

## 2011-07-07 LAB — BASIC METABOLIC PANEL
CO2: 27 mEq/L (ref 19–32)
Chloride: 102 mEq/L (ref 96–112)
Sodium: 137 mEq/L (ref 135–145)

## 2011-07-07 LAB — MAGNESIUM: Magnesium: 1.8 mg/dL (ref 1.5–2.5)

## 2011-07-08 DIAGNOSIS — M7989 Other specified soft tissue disorders: Secondary | ICD-10-CM

## 2011-07-12 ENCOUNTER — Ambulatory Visit: Payer: Managed Care, Other (non HMO)

## 2011-07-12 ENCOUNTER — Other Ambulatory Visit: Payer: Managed Care, Other (non HMO)

## 2011-07-13 ENCOUNTER — Telehealth: Payer: Self-pay | Admitting: *Deleted

## 2011-07-13 NOTE — Telephone Encounter (Signed)
Annette Snow I have called this pt twice last week and again today to discuss the post colon complication. She is doing better. I could not find a tel. call message in the EPIC to document it. Is there any way You can transfer this note to make it a tel.call ?

## 2011-07-15 NOTE — H&P (Signed)
NAMESULLIVAN, [name] NO.:  [id number]  MEDICAL RECORD NO.:  [id number]  LOCATION:  [facility]                         FACILITY:  [facility]  PHYSICIAN:  Ardeth Sportsman, MD     DATE OF BIRTH:  Aug 01, 1961  DATE OF ADMISSION:  07/01/2011 DATE OF DISCHARGE:                             HISTORY & PHYSICAL   PRIMARY CARE PHYSICIAN:  Darryl Nestle, MD  GASTROENTEROLOGIST:  Hedwig Morton. Juanda Chance, MD  NEUROLOGIST:  Ardeth Sportsman, MD  REQUESTING PHYSICIAN:  Kennon Rounds, MD, [facility].  REASON FOR EVALUATION:  Shock, hypertension, probable ruptured spleen.  HISTORY OF PRESENT ILLNESS:  Annette Snow is a 50 year old female who underwent screening colonoscopy yesterday.  A few polyps were removed, but otherwise documented as unremarkable.  She noted worsening abdominal pain about 12 hours ago.  That was rather sudden and worsened.  Pain seems to be more on the left side going to her left shoulder.  The pain became much more intense.  She called Gastrology Office and they recommended emergency evaluation.  They went to [facility]. She came in with systolic pressure in the 90s.  She dropped her pressure, got a crystalloid, had a CAT scan given the abdominal pain that showed evidence of hematoma with extravasation contrast on the spleen.  Dr. Fredricka Bonine requested transfer to hospital.  Based on OR availability, we sent the patient to [facility].  Dr. Fredricka Bonine and I have evaluated the patient at bedside.  PAST MEDICAL HISTORY: 1. Endometriosis. 2. Irritable bowel syndrome. 3. 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 4. Migraines. 5. Hemorrhoids.  PAST SURGICAL HISTORY: 1. She has had a diagnostic laparoscopies with removal of extensive     endometriosis numerous times. 2. She has had abdominal hysterectomy. 3. She has had bilateral bunionectomies. 4. Ankle reconstruction. 5. Knee arthroscopy, 3 on the left, 2 on the right. 6. Breast  augmentation with saline implants. 7. Wrist reconstruction on the left, twice. 8. Foot surgery on the left, twice. 9. Tonsillectomy and adenoidectomy.  MEDICATIONS:  She takes Vivelle-Dot transdermally twice a week.  ALLERGIES:  To NUCYNTA.  SOCIAL HISTORY:  She used to smoke, but quit several years ago.  She averages about one can of beer a day.  Her ex-husband is at the bedside. I believe her new husband was at the bedside earlier.  FAMILY HISTORY:  Noncontributory.  REVIEW OF SYSTEMS:  Noted per HPI, otherwise she denies any dysphagia, some nausea, but no vomiting.  No hematochezia or melena.  No sick contacts or travel history.  In general, she feels very weak, tend fatigued.  No sudden weight loss, no fevers, sweats, and chills.  EYES, ENT, RESPIRATORY, CARDIAC:  Otherwise negative.  HEPATIC, RENAL, ENDOCRINE, HEME, LYMPH, ALLERGIC:  Otherwise negative.  GYN:  As above. No vaginal bleeding or discharge.  MUSCULOSKELETAL:  With pain and soreness as noted above.  Stable arthralgias.  PHYSICAL EXAMINATION:  VITAL SIGNS:  In the [place] in [place], her systolics are running in the 80s, pulses in the 70s, respirations 18, she is satting on 2 liters in the high 90s. GENERAL:  She is a  well-developed, well-nourished female lying very still, obviously looks tired and little bit shocky. HEENT:  Pupils equal, round, and reactive to light.  Extraocular movements intact.  Sclerae are noninjected.  Neck is supple.  No masses. Trachea is midline. HEART:  Regular rate and rhythm.  No murmurs, clicks, or rubs. CHEST:  No pain on rib or sternal compression.  Lungs, clear to auscultation bilaterally. BREASTS:  Status post augmentation with no obvious nipple discharge or bleeding or obvious skin changes ABDOMEN:  Distended with moderate tenderness on the left side.  She did not have peritoneal signs, but she has obvious discomfort.  She has no new hernias. GENITAL:  No obvious vaginal  bleeding or inguinal hernias. MUSCULOSKELETAL:  She seems to have pretty good range of motion at shoulders, elbows, wrists, as well as hip, knees, and ankles. SKIN:  No petechiae or purpura.  No other obvious source of lesions.  STUDIES:  Her hemoglobin is around 10.8.  Her electrolytes are normal including a normal creatinine.  CT scan shows a fair amount of fluid in the abdomen, most consistent with hemoperitoneum by Hounsfield unit.  She has obvious splenic hematoma with some contrast extravasation on one of the main branches of the splenic vessels.  I did not see bowel obstruction.  I did not see any free air concerning for perforation.  No pneumoperitoneum, pneumatosis, or portal venous gas and IVC seemed normal.  ASSESSMENT AND PLAN: 65. A 50 year old female with status post colonoscopy yesterday with     worsening severe left-sided abdominal pain and evidence of     hemoperitoneum and splenic rupture, want to admit to continue     transfusion.  She already received untyped blood en route and is     getting more now. 2. Emergent operative exploration.  Given the fact, she has required     blood and is still hypotensive, with extravasation on CT scan, Dr.     Fredricka Bonine and I feel that her risks of nonoperative management are     high and situation requires better control with removal of the     bleeding source which is most likely the spleen.  Technique and     risks, benefits, alternatives were discussed.  She will need ICU     stay hereafter.  We are checking coags to make sure she does not need any plasma. Discussion was made with her ex-husband at the bedside as well and they agree with this plan.     Ardeth Sportsman, MD     SCG/MEDQ  D:  07/01/2011  T:  07/01/2011  Job:  [id number]  cc:   Hedwig Morton. Juanda Chance, MD Kennon Rounds, MD  Electronically Signed by Karie Soda MD on 07/15/2011 11:09:25 AM

## 2011-07-15 NOTE — Telephone Encounter (Signed)
See other phone message  

## 2011-07-16 ENCOUNTER — Ambulatory Visit (INDEPENDENT_AMBULATORY_CARE_PROVIDER_SITE_OTHER): Payer: Managed Care, Other (non HMO) | Admitting: Surgery

## 2011-07-16 ENCOUNTER — Encounter (INDEPENDENT_AMBULATORY_CARE_PROVIDER_SITE_OTHER): Payer: Self-pay | Admitting: Surgery

## 2011-07-16 VITALS — BP 122/64 | HR 64 | Temp 97.8°F | Resp 12 | Ht 66.0 in | Wt 121.8 lb

## 2011-07-16 DIAGNOSIS — Z9889 Other specified postprocedural states: Secondary | ICD-10-CM

## 2011-07-16 DIAGNOSIS — Z9089 Acquired absence of other organs: Secondary | ICD-10-CM

## 2011-07-16 DIAGNOSIS — Z9081 Acquired absence of spleen: Secondary | ICD-10-CM

## 2011-07-16 NOTE — Patient Instructions (Signed)
Splenectomy, Long-Term Care After A splenectomy is the surgical removal of a diseased or injured spleen. The most common reasons for the spleen to be removed are because of severe injury (trauma), sickle cell disease, or a condition that causes blood clotting problems (idiopathic thrombocytopenic purpura, ITP). The spleen is an organ located in the upper abdomen under your left ribs. It is a sponge-like organ, about the size of an orange, which acts as a filter. The spleen removes waste from the blood, maintains cells that make antibodies to help fight infection, and stores other blood cells. The spleen, along with other body systems and organs, plays an important role in the body's natural defense system (immune system). Once it is removed, there is a slightly greater chance of developing a serious, life-threatening infection (overwhelming postsplenectomy sepsis). It is important to take extra steps to prevent infection. Other precautions may be necessary to prevent blood clots from forming. PREVENTING INFECTION Your caregiver will recommend important steps to help prevent infection. These may include:  Making sure your immunizations are up to date, including:   Pneumococcus.   Seasonal flu (influenza).   Haemophilus influenzae type b (Hib).   Meningitis.   Making sure family members' vaccines are up to date.   In some cases, antibiotics may be needed if you get symptoms of infection. Not all patients need this type of treatment after a splenectomy. Talk to your caregiver about what is best for you if these symptoms develop.   Following good daily practices to prevent infection, such as:   Washing hands often, especially after preparing food, eating, changing diapers, and playing with children or animals.   Disinfecting surfaces regularly.   Avoiding others with active illness or infections.   Taking precautions to avoid mosquito and tick bites, such as:   Wearing proper clothing that  covers the entire body when you are in wooded or marshy areas.   Changing clothing right away and checking for bites after you have been outside.   Using insect spray.   Using insect netting.   Staying indoors during peak mosquito hours.  PREVENTING BLOOD CLOTS Splenectomy may increase your risk of forming a blood clot. This is of utmost concern immediately after surgery. However, it may continue as a lifelong risk. To help prevent blood clots, your caregiver may recommend:  Exercising as directed. Find a safe, regular exercise program that works well for you.   Getting up, stretching, and moving around every hour if you sit a lot or do not move about much at work or during travel time.   Taking medicines (such as aspirin) as directed.  TRAVEL If you travel in the [place] take care to avoid tick bites, especially in the [place] coastal areas. Ticks can spread the parasite that causes babesiosis. Babesiosis is a flu-like illness that is treatable. If you travel abroad where malaria is common, follow these guidelines:  Contact your caregiver and discuss the places you are visiting for specific advice.   Make sure all of your immunizations are up to date.   Get specific immunizations to guard against the disease risk in the country you are visiting.   Understand how to prevent infections, such as malaria, abroad. These infections can pose serious risk. Precautions may include:   Daily tablets to prevent malaria.   Using mosquito nets.   Using insect spray.   Bring your broad-spectrum, full-strength antibiotics with you.  HOME CARE INSTRUCTIONS   Take all medicines as directed. If you are  prescribed antibiotics, discuss with your caregiver the use of a probiotic supplement to prevent stomach upset.   Keep track of medicine refills so that you do not run out of medicine.   Inform your close contacts of your condition. Consider wearing a medical alert bracelet or carrying an ID card.    See your caregiver for vaccinations, follow-up exams, and testing as directed.   Follow all your caregiver's instructions on managing this and other conditions.  SEEK MEDICAL CARE IF:   You have an oral temperature above 102 F (38.9 C).   You develop signs of infection, such as chills and feeling unwell, that continue after taking the full-strength antibiotic.   You are considering travel abroad.   You have questions or concerns.  SEEK IMMEDIATE MEDICAL CARE IF:   You have chest pain along with:   Shortness of breath.   Pain in the back, neck, or jaw.   You have pain or swelling in the leg.   You develop a sudden headache and dizziness.  MAKE SURE YOU:   Understand these instructions.   Will watch your condition.   Will get help right away if you are not doing well or get worse.  Document Released: 03/03/2010 Document Revised: 05/26/2011 Document Reviewed: 03/03/2010 [facility] Patient Information 2012 [place], [place].

## 2011-07-16 NOTE — Progress Notes (Signed)
The patient returns after splenectomy. She is doing well except for some continued pain along her incision. She received her immunizations in the hospital prior to discharge. She is tolerating a regular diet.  On exam: Midline incision well-healed. Staples removed and Steri-Strips placed. Soft nontender.  Impression status post splenectomy after colonoscopy secondary to splenic rupture  Plan: I have given her post splenectomy instructions about wound care and if she develops any sort of infection. She has received her immunizations while in the hospital. She will resume full activity in 2 weeks in followup as needed.

## 2011-07-16 NOTE — ED Provider Notes (Signed)
Evaluation and management procedures were performed by the PA/NP under my supervision/collaboration.    Indiya Izquierdo D Melitta Tigue, MD 07/16/11 1926 

## 2011-07-19 ENCOUNTER — Other Ambulatory Visit: Payer: Self-pay | Admitting: *Deleted

## 2011-07-19 DIAGNOSIS — Z9081 Acquired absence of spleen: Secondary | ICD-10-CM

## 2011-07-20 ENCOUNTER — Other Ambulatory Visit (INDEPENDENT_AMBULATORY_CARE_PROVIDER_SITE_OTHER): Payer: Managed Care, Other (non HMO)

## 2011-07-20 ENCOUNTER — Other Ambulatory Visit: Payer: Self-pay | Admitting: Internal Medicine

## 2011-07-20 ENCOUNTER — Ambulatory Visit (INDEPENDENT_AMBULATORY_CARE_PROVIDER_SITE_OTHER)
Admission: RE | Admit: 2011-07-20 | Discharge: 2011-07-20 | Disposition: A | Discharge status: Home / Self Care | Payer: Managed Care, Other (non HMO) | Source: Ambulatory Visit | Attending: Internal Medicine | Admitting: Internal Medicine

## 2011-07-20 DIAGNOSIS — Z9081 Acquired absence of spleen: Secondary | ICD-10-CM

## 2011-07-20 DIAGNOSIS — R079 Chest pain, unspecified: Secondary | ICD-10-CM

## 2011-07-20 DIAGNOSIS — Z9089 Acquired absence of other organs: Secondary | ICD-10-CM

## 2011-07-20 LAB — CBC WITH DIFFERENTIAL/PLATELET
Eosinophils Relative: 5 % (ref 0.0–5.0)
HCT: 35.8 % — ABNORMAL LOW (ref 36.0–46.0)
Hemoglobin: 12 g/dL (ref 12.0–15.0)
Lymphs Abs: 2.5 10*3/uL (ref 0.7–4.0)
Monocytes Relative: 8.6 % (ref 3.0–12.0)
Neutro Abs: 3.2 10*3/uL (ref 1.4–7.7)
Platelets: 595 10*3/uL — ABNORMAL HIGH (ref 150.0–400.0)
WBC: 6.8 10*3/uL (ref 4.5–10.5)

## 2011-07-20 LAB — COMPREHENSIVE METABOLIC PANEL
Albumin: 3.8 g/dL (ref 3.5–5.2)
CO2: 28 mEq/L (ref 19–32)
Calcium: 9.2 mg/dL (ref 8.4–10.5)
Chloride: 103 mEq/L (ref 96–112)
GFR: 105.27 mL/min (ref >60.00)
Glucose, Bld: 87 mg/dL (ref 70–99)
Sodium: 139 mEq/L (ref 135–145)
Total Bilirubin: 0.5 mg/dL (ref 0.3–1.2)
Total Protein: 6.9 g/dL (ref 6.0–8.3)

## 2011-07-20 LAB — SEDIMENTATION RATE: Sed Rate: 22 mm/hr (ref 0–22)

## 2011-07-20 NOTE — Discharge Summary (Signed)
  NAMEADDYSYN, FERN NO.:  [id number]  MEDICAL RECORD NO.:  [id number]  LOCATION:  5120                         FACILITY:  [facility]  PHYSICIAN:  Brayton El, PA-C    DATE OF BIRTH:  09/10/1961  DATE OF ADMISSION:  07/01/2011 DATE OF DISCHARGE:  07/09/2011                              DISCHARGE SUMMARY   HISTORY OF PRESENT ILLNESS:  Ms. Tilden Fossa is a pleasant 50 year old female who underwent a screening colonoscopy prior to admission. Several polyps were removed, but developed some severe abdominal discomfort.  She called her Gastroenterology office and asked her to go to the ER.  She went to the [facility], was found to be hypotensive and a CAT scan that showed evidence of hematomas with extravasation of contrast on the spleen site.  The patient was transferred urgently to [facility] where Dr. Luisa Hart saw the patient upon arrival, and felt that the patient had acutely ruptured spleen and needed urgent surgical intervention.  SUMMARY OF HOSPITAL COURSE:  The patient was admitted on July 01, 2011, taken to the operating room and underwent exploratory laparotomy with splenectomy.  The patient tolerated the procedure well, was admitted to the floor in a stable condition.  Her blood pressure normalized after volume replacement.  Postoperatively, her hemoglobin also stabilized.  She did have a mild postoperative ileus that took several extra days for her bowel function to return, but gradually it did.  She began tolerating liquid diet, subsequently was advanced.  She did develop some lower extremity edema, but a venous duplex study showed no evidence of DVT.  She was given triple vaccinations prior to discharge and was determined stable for discharge on postop day 8.  DISCHARGE DIAGNOSES: 1. Acute ruptured spleen status post exploratory laparotomy and     splenectomy. 2. History of irritable bowel syndrome. 3. Migraines. 4.  Endometriosis.  DISCHARGE MEDICATIONS:  The patient will continue to take Vivelle patch twice weekly.  She will be given a prescription for Percocet 5/325 one to two 2 tabs q.4 h. p.r.n. pain.  She was given prescription scopolamine patches as this has been very effective for her intermittent nausea 1 patch every 3 days.  She is given preprinted discharge instructions to follow and asked to come back in the office next week to see Dr. Luisa Hart for postop evaluation and staple removal.     Brayton El, PA-C     KB/MEDQ  D:  07/09/2011  T:  07/09/2011  Job:  [id number]  Electronically Signed by Brayton El  on 07/14/2011 03:51:19 PM Electronically Signed by Cyndia Bent M.D. on 07/20/2011 10:23:39 AM

## 2011-07-21 ENCOUNTER — Telehealth: Payer: Self-pay | Admitting: *Deleted

## 2011-07-21 NOTE — Telephone Encounter (Signed)
Per Dr. Juanda Chance- labs and CXR are normal. Dr. Juanda Chance will be getting back with her on the insurance. Spoke with patient and gave her this information

## 2011-07-22 ENCOUNTER — Telehealth (INDEPENDENT_AMBULATORY_CARE_PROVIDER_SITE_OTHER): Payer: Self-pay | Admitting: Surgery

## 2011-07-22 NOTE — Telephone Encounter (Signed)
APPT MADE FOR PT TO SEE DR. CORNETT ON Monday 07-26-11 RE SMALL AMT DRAINAGE FROM LOWER END OF INCISION.

## 2011-07-23 ENCOUNTER — Telehealth (INDEPENDENT_AMBULATORY_CARE_PROVIDER_SITE_OTHER): Payer: Self-pay

## 2011-07-23 DIAGNOSIS — Z9081 Acquired absence of spleen: Secondary | ICD-10-CM

## 2011-07-23 MED ORDER — HYDROCODONE-ACETAMINOPHEN 5-325 MG PO TABS: 1.0000 | ORAL_TABLET | ORAL | Status: AC | PRN

## 2011-07-23 NOTE — Telephone Encounter (Signed)
Pt called requesting refill on Oxycodone Rx but has never had a refill on this Rx. I advised that we could call in the automatic refill which is Hydrocodone 5/325mg  #30 phoned to [facility]  [phone] / [facility]

## 2011-07-26 ENCOUNTER — Encounter (INDEPENDENT_AMBULATORY_CARE_PROVIDER_SITE_OTHER): Payer: Self-pay | Admitting: Surgery

## 2011-07-26 ENCOUNTER — Ambulatory Visit (INDEPENDENT_AMBULATORY_CARE_PROVIDER_SITE_OTHER): Payer: Managed Care, Other (non HMO) | Admitting: Surgery

## 2011-07-26 VITALS — BP 100/72 | HR 68 | Temp 98.8°F | Resp 16 | Ht 66.0 in | Wt 123.6 lb

## 2011-07-26 DIAGNOSIS — Z9889 Other specified postprocedural states: Secondary | ICD-10-CM

## 2011-07-26 MED ORDER — PROMETHAZINE HCL 12.5 MG PO TABS: 12.5000 mg | ORAL_TABLET | Freq: Four times a day (QID) | ORAL | Status: DC | PRN

## 2011-07-26 NOTE — Progress Notes (Signed)
The patient returns today to have her incision check. She is almost a month out from an open splenectomy secondary  to splenic rupture secondary to colonoscopy. She is tired but doing okay. There is a small open area in her wound she wants to check. She does have some nausea.  Exam: Midline incision well-healed. Area of concern is suture under the skin. No signs of infection.  Impression status post open splenectomy  Plan: Return to clinic as needed. Resume full activity the next 2-3 weeks.

## 2011-07-26 NOTE — Patient Instructions (Signed)
Follow up as needed

## 2011-07-28 ENCOUNTER — Ambulatory Visit (HOSPITAL_BASED_OUTPATIENT_CLINIC_OR_DEPARTMENT_OTHER)
Admission: RE | Admit: 2011-07-28 | Payer: Managed Care, Other (non HMO) | Source: Ambulatory Visit | Admitting: Podiatry

## 2011-08-11 ENCOUNTER — Ambulatory Visit: Payer: Managed Care, Other (non HMO)

## 2011-08-11 ENCOUNTER — Other Ambulatory Visit: Payer: Managed Care, Other (non HMO)

## 2011-09-01 ENCOUNTER — Other Ambulatory Visit: Payer: Self-pay | Admitting: Internal Medicine

## 2011-09-01 DIAGNOSIS — S36039A Unspecified laceration of spleen, initial encounter: Secondary | ICD-10-CM

## 2011-09-06 ENCOUNTER — Ambulatory Visit
Admission: RE | Admit: 2011-09-06 | Discharge: 2011-09-06 | Disposition: A | Discharge status: Home / Self Care | Payer: Managed Care, Other (non HMO) | Source: Ambulatory Visit | Attending: Obstetrics & Gynecology | Admitting: Obstetrics & Gynecology

## 2011-09-06 ENCOUNTER — Ambulatory Visit: Payer: Managed Care, Other (non HMO)

## 2011-09-06 ENCOUNTER — Other Ambulatory Visit (INDEPENDENT_AMBULATORY_CARE_PROVIDER_SITE_OTHER): Payer: Managed Care, Other (non HMO)

## 2011-09-06 DIAGNOSIS — Z780 Asymptomatic menopausal state: Secondary | ICD-10-CM

## 2011-09-06 DIAGNOSIS — S36039A Unspecified laceration of spleen, initial encounter: Secondary | ICD-10-CM

## 2011-09-06 DIAGNOSIS — S3609XA Other injury of spleen, initial encounter: Secondary | ICD-10-CM

## 2011-09-06 LAB — COMPREHENSIVE METABOLIC PANEL
ALT: 19 U/L (ref 0–35)
AST: 19 U/L (ref 0–37)
CO2: 29 mEq/L (ref 19–32)
Calcium: 9.4 mg/dL (ref 8.4–10.5)
Chloride: 105 mEq/L (ref 96–112)
GFR: 72.5 mL/min (ref >60.00)
Potassium: 4.5 mEq/L (ref 3.5–5.1)
Sodium: 141 mEq/L (ref 135–145)
Total Protein: 6.5 g/dL (ref 6.0–8.3)

## 2011-09-06 LAB — CBC WITH DIFFERENTIAL/PLATELET
Basophils Absolute: 0.1 10*3/uL (ref 0.0–0.1)
Eosinophils Absolute: 0.4 10*3/uL (ref 0.0–0.7)
Hemoglobin: 13.6 g/dL (ref 12.0–15.0)
Lymphocytes Relative: 48.9 % — ABNORMAL HIGH (ref 12.0–46.0)
Monocytes Relative: 11.4 % (ref 3.0–12.0)
Neutrophils Relative %: 29.5 % — ABNORMAL LOW (ref 43.0–77.0)
Platelets: 252 10*3/uL (ref 150.0–400.0)
RDW: 14.9 % — ABNORMAL HIGH (ref 11.5–14.6)

## 2011-09-08 ENCOUNTER — Ambulatory Visit (INDEPENDENT_AMBULATORY_CARE_PROVIDER_SITE_OTHER): Payer: Managed Care, Other (non HMO) | Admitting: Family Medicine

## 2011-09-08 ENCOUNTER — Encounter: Payer: Self-pay | Admitting: Family Medicine

## 2011-09-08 DIAGNOSIS — Q8909 Congenital malformations of spleen: Secondary | ICD-10-CM

## 2011-09-08 DIAGNOSIS — M79609 Pain in unspecified limb: Secondary | ICD-10-CM

## 2011-09-08 DIAGNOSIS — K589 Irritable bowel syndrome without diarrhea: Secondary | ICD-10-CM | POA: Insufficient documentation

## 2011-09-08 DIAGNOSIS — N809 Endometriosis, unspecified: Secondary | ICD-10-CM

## 2011-09-08 DIAGNOSIS — M67439 Ganglion, unspecified wrist: Secondary | ICD-10-CM

## 2011-09-08 DIAGNOSIS — L989 Disorder of the skin and subcutaneous tissue, unspecified: Secondary | ICD-10-CM

## 2011-09-08 DIAGNOSIS — M79671 Pain in right foot: Secondary | ICD-10-CM

## 2011-09-08 DIAGNOSIS — R109 Unspecified abdominal pain: Secondary | ICD-10-CM

## 2011-09-08 DIAGNOSIS — M6740 Ganglion, unspecified site: Secondary | ICD-10-CM

## 2011-09-08 DIAGNOSIS — Q8901 Asplenia (congenital): Secondary | ICD-10-CM

## 2011-09-08 DIAGNOSIS — G541 Lumbosacral plexus disorders: Secondary | ICD-10-CM

## 2011-09-08 DIAGNOSIS — M928 Other specified juvenile osteochondrosis: Secondary | ICD-10-CM

## 2011-09-08 HISTORY — DX: Asplenia (congenital): Q89.01

## 2011-09-08 NOTE — Patient Instructions (Signed)

## 2011-09-13 ENCOUNTER — Encounter: Payer: Self-pay | Admitting: Family Medicine

## 2011-09-13 DIAGNOSIS — M67439 Ganglion, unspecified wrist: Secondary | ICD-10-CM | POA: Insufficient documentation

## 2011-09-13 DIAGNOSIS — G541 Lumbosacral plexus disorders: Secondary | ICD-10-CM

## 2011-09-13 DIAGNOSIS — N809 Endometriosis, unspecified: Secondary | ICD-10-CM | POA: Insufficient documentation

## 2011-09-13 DIAGNOSIS — M79671 Pain in right foot: Secondary | ICD-10-CM

## 2011-09-13 DIAGNOSIS — R109 Unspecified abdominal pain: Secondary | ICD-10-CM | POA: Insufficient documentation

## 2011-09-13 DIAGNOSIS — M92529 Juvenile osteochondrosis of tibia tubercle, unspecified leg: Secondary | ICD-10-CM | POA: Insufficient documentation

## 2011-09-13 HISTORY — DX: Lumbosacral plexus disorders: G54.1

## 2011-09-13 HISTORY — DX: Pain in right foot: M79.671

## 2011-09-13 HISTORY — DX: Juvenile osteochondrosis of tibia tubercle, unspecified leg: M92.529

## 2011-09-13 NOTE — Assessment & Plan Note (Signed)
Patient has had several surgeries and is awaiting another surgery to repair her foot again, she is due to have several joints replaced and pins placed as well

## 2011-09-13 NOTE — Assessment & Plan Note (Signed)
Encouraged daily probiotics and fiber supplements, increase hydration

## 2011-09-13 NOTE — Assessment & Plan Note (Signed)
Patient with many orthopaedic surgeries over the years including to left wrist x twice , to right foot twice.

## 2011-09-13 NOTE — Progress Notes (Signed)
Patient ID: Annette Snow, female   DOB: [birth year], 50 y.o.   MRN: [id number] Annette Snow [id number] 12-23-1960 09/13/2011      Progress Note New Patient  Subjective  Chief Complaint  Chief Complaint  Patient presents with  . Establish Care    new patient    HPI  Patient is a 50 year old Caucasian female who is in today to establish care. She's had of hearing in the past he went for routine colonoscopy and unfortunately suffered an injury to her spleen resulting in emergency splenectomy and significant abdominal surgery and resulting pain. She is slowly recovering after a difficulty with abdominal pain and constipation she seems to find a regimen which is making her pain tolerable. Her other complaints are largely musculoskeletal. In the past her knees she had multiple surgeries for right foot pain is due for a chronic pain. She also has presacral neuropathy with correction of this she struggles with ganglion cyst. No recent febrile illness, chest pain, palpitations, shortness of breath, GU complaints noted today.  Past Medical History  Diagnosis Date  . Ulcer 1984    petic and duodenal  . IBS (irritable bowel syndrome)   . Migraines   . Hemorrhoids   . Blood transfusion   . 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 Neuropathy, sacral plexus 09/13/2011  . Osgood-Schlatter's disease 09/13/2011  . Endometriosis 09/13/2011  . Abdominal pain 09/13/2011  . Foot pain, right 09/13/2011    Past Surgical History  Procedure Date  . Laparoscopic hysterectomy 2000    total for extensive endometriosis  . Bunionectomy     bilateral  . Ankle reconstruction     left @ age 12  . Knee arthroscopy     bilateral  3 on left 2 on right  . Breast enhancement surgery   . Wrist reconstruction     left twice  . Foot surgery     left twice  . Tonsillectomy and adenoidectomy   . Laparoscopy     multiple times for endometriosis on all organs  . Abdominal hysterectomy   . Splenectomy 07/01/11    Family  History  Problem Relation Age of Onset  . Alzheimer's disease Maternal Grandmother   . Heart attack Maternal Grandfather   . Leukemia Paternal Grandfather   . Other Sister     liver disorder  . Cirrhosis Sister     primary biliary cirrhosis    History   Social History  . Marital Status: Married    Spouse Name: N/A    Number of Children: N/A  . Years of Education: N/A   Occupational History  . Not on file.   Social History Main Topics  . Smoking status: Former Smoker -- 0.0 packs/day  . Smokeless tobacco: Never Used  . Alcohol Use: 3.6 oz/week    6 Cans of beer per week  . Drug Use: Yes    Special: Marijuana     for pain  . Sexually Active: No   Other Topics Concern  . Not on file   Social History Narrative  . No narrative on file    Current Outpatient Prescriptions on File Prior to Visit  Medication Sig Dispense Refill  . VIVELLE-DOT 0.1 MG/24HR Place 1 patch onto the skin 2 (two) times a week.        Allergies  Allergen Reactions  . Nucynta (Tapentadol Hydrochloride) Itching    Review of Systems  Review of Systems  Constitutional: Negative for fever, chills and malaise/fatigue.  HENT: Negative for hearing  loss, nosebleeds and congestion.   Eyes: Negative for pain and discharge.  Respiratory: Negative for cough, sputum production, shortness of breath and wheezing.   Cardiovascular: Negative for chest pain, palpitations and leg swelling.  Gastrointestinal: Positive for abdominal pain and constipation. Negative for heartburn, nausea, vomiting, diarrhea and blood in stool.  Genitourinary: Negative for dysuria, urgency, frequency and hematuria.  Musculoskeletal: Positive for joint pain. Negative for myalgias, back pain and falls.  Skin: Negative for rash.  Neurological: Negative for dizziness, tremors, sensory change, focal weakness, loss of consciousness, weakness and headaches.  Endo/Heme/Allergies: Negative for polydipsia. Does not bruise/bleed easily.    Psychiatric/Behavioral: Negative for depression and suicidal ideas. The patient is not nervous/anxious and does not have insomnia.     Objective  BP 106/70  Pulse 81  Temp(Src) 97.6 F (36.4 C) (Oral)  Ht 5\' 6"  (1.676 m)  Wt 127 lb 1.9 oz (57.661 kg)  BMI 20.52 kg/m2  SpO2 100%  Physical Exam  Physical Exam  Constitutional: She is oriented to person, place, and time and well-developed, well-nourished, and in no distress. No distress.  HENT:  Head: Normocephalic and atraumatic.  Right Ear: External ear normal.  Left Ear: External ear normal.  Nose: Nose normal.  Mouth/Throat: Oropharynx is clear and moist. No oropharyngeal exudate.  Eyes: Conjunctivae are normal. Pupils are equal, round, and reactive to light. Right eye exhibits no discharge. Left eye exhibits no discharge. No scleral icterus.  Neck: Normal range of motion. Neck supple. No thyromegaly present.  Cardiovascular: Normal rate, regular rhythm, normal heart sounds and intact distal pulses.   No murmur heard. Pulmonary/Chest: Effort normal and breath sounds normal. No respiratory distress. She has no wheezes. She has no rales.  Abdominal: Soft. Bowel sounds are normal. She exhibits no distension and no mass. There is tenderness.       Extensive scar tissue across abdomen  Musculoskeletal: Normal range of motion. She exhibits no edema and no tenderness.  Lymphadenopathy:    She has no cervical adenopathy.  Neurological: She is alert and oriented to person, place, and time. She has normal reflexes. No cranial nerve deficit. Coordination normal.  Skin: Skin is warm and dry. No rash noted. She is not diaphoretic.  Psychiatric: Mood, memory and affect normal.       Assessment & Plan  IBS (irritable bowel syndrome) Encouraged daily probiotics and fiber supplements, increase hydration  Abdominal pain Patient underwent routine colonoscopy earlier this year and unfortunately her spleen was punctured and she had an  emergency splenectomy. She was in the hospital for an extended period of time and she continues to struggle with abdominal pain. She will continue to follow with surgery and is tolerating her pain.  Osgood-Schlatter's disease Patient with many orthopaedic surgeries over the years including to left wrist x twice , to right foot twice.   Foot pain, right Patient has had several surgeries and is awaiting another surgery to repair her foot again, she is due to have several joints replaced and pins placed as well

## 2011-09-13 NOTE — Assessment & Plan Note (Signed)
Patient underwent routine colonoscopy earlier this year and unfortunately her spleen was punctured and she had an emergency splenectomy. She was in the hospital for an extended period of time and she continues to struggle with abdominal pain. She will continue to follow with surgery and is tolerating her pain.

## 2011-09-23 ENCOUNTER — Telehealth: Payer: Self-pay | Admitting: Internal Medicine

## 2011-09-23 NOTE — Telephone Encounter (Signed)
Patient called to report pain on RUQ for the last week and a half.  She reports pain is worse with a deep breath or when she lies on her left side.  Denies constipations, diarrhea, fever, nausea or vomiting.  Patient had emergency spleen ectomy after colon on 06/30/11. She saw her primary care on 09/08/11 for the same pain, she is more comfortable with Dr Juanda Chance and would like your advise.  Dr Juanda Chance please advise

## 2011-09-24 NOTE — Telephone Encounter (Signed)
I have again left a message for her to call me.  I will be happy to see her in the office next week.

## 2011-09-24 NOTE — Telephone Encounter (Signed)
I left a message for her that I will try to reach her again before going out of town tomorrow.

## 2011-09-27 NOTE — Telephone Encounter (Signed)
Spoke with patient and she is out of town and cannot come on 10/01/11. Scheduled her on 10/12/11 at 2:00 PM.

## 2011-09-29 ENCOUNTER — Telehealth: Payer: Self-pay | Admitting: Internal Medicine

## 2011-09-29 NOTE — Telephone Encounter (Signed)
Patient calling to see if she can move her appointment up to 10/01/11 due to increase in pain. She is out of town and will try to change her flight. Scheduled patient on 10/01/11 at 9:45 AM.

## 2011-09-30 ENCOUNTER — Encounter: Payer: Self-pay | Admitting: *Deleted

## 2011-10-01 ENCOUNTER — Ambulatory Visit: Payer: No Typology Code available for payment source | Admitting: Internal Medicine

## 2011-10-04 ENCOUNTER — Encounter: Payer: Self-pay | Admitting: Family Medicine

## 2011-10-04 ENCOUNTER — Ambulatory Visit (INDEPENDENT_AMBULATORY_CARE_PROVIDER_SITE_OTHER): Payer: Managed Care, Other (non HMO) | Admitting: Family Medicine

## 2011-10-04 ENCOUNTER — Telehealth: Payer: Self-pay | Admitting: Family Medicine

## 2011-10-04 DIAGNOSIS — R109 Unspecified abdominal pain: Secondary | ICD-10-CM

## 2011-10-04 DIAGNOSIS — R112 Nausea with vomiting, unspecified: Secondary | ICD-10-CM

## 2011-10-04 LAB — RENAL FUNCTION PANEL
Albumin: 4.2 g/dL (ref 3.5–5.2)
Calcium: 9.5 mg/dL (ref 8.4–10.5)
Chloride: 104 mEq/L (ref 96–112)
Glucose, Bld: 72 mg/dL (ref 70–99)
Phosphorus: 3.7 mg/dL (ref 2.3–4.6)
Potassium: 4.1 mEq/L (ref 3.5–5.1)
Sodium: 140 mEq/L (ref 135–145)

## 2011-10-04 LAB — CBC
HCT: 44.2 % (ref 36.0–46.0)
Hemoglobin: 14.8 g/dL (ref 12.0–15.0)
MCV: 92.1 fl (ref 78.0–100.0)
RBC: 4.8 Mil/uL (ref 3.87–5.11)

## 2011-10-04 LAB — SEDIMENTATION RATE: Sed Rate: 2 mm/hr (ref 0–22)

## 2011-10-04 MED ORDER — OXYCODONE-ACETAMINOPHEN 5-325 MG PO TABS: 1.0000 | ORAL_TABLET | Freq: Three times a day (TID) | ORAL | Status: DC | PRN

## 2011-10-04 MED ORDER — PROMETHAZINE HCL 12.5 MG PO TABS: 12.5000 mg | ORAL_TABLET | Freq: Three times a day (TID) | ORAL | Status: DC | PRN

## 2011-10-04 NOTE — Telephone Encounter (Signed)
Please advise 

## 2011-10-04 NOTE — Telephone Encounter (Signed)
OK to recreate these this once

## 2011-10-04 NOTE — Patient Instructions (Signed)
Cholelithiasis Cholelithiasis (also called gallstones) is a form of gallbladder disease where gallstones form in your gallbladder. The gallbladder is a non-essential organ that stores bile made in the liver, which helps digest fats. Gallstones begin as small crystals and slowly grow into stones. Gallstone pain occurs when the gallbladder spasms, and a gallstone is blocking the duct. Pain can also occur when a stone passes out of the duct.  Women are more likely to develop gallstones than men. Other factors that increase the risk of gallbladder disease are:  Having multiple pregnancies. Physicians sometimes advise removing diseased gallbladders before future pregnancies.   Obesity.   Diets heavy in fried foods and fat.   Increasing age (older than 81).   Prolonged use of medications containing female hormones.   Diabetes mellitus.   Rapid weight loss.   Family history of gallstones (heredity).  SYMPTOMS  Feeling sick to your stomach (nauseous).   Abdominal pain.   Yellowing of the skin (jaundice).   Sudden pain. It may persist from several minutes to several hours.   Worsening pain with deep breathing or when jarred.   Fever.   Tenderness to the touch.  In some cases, when gallstones do not move into the bile duct, people have no pain or symptoms. These are called "silent" gallstones. TREATMENT In severe cases, emergency surgery may be required. HOME CARE INSTRUCTIONS   Only take over-the-counter or prescription medicines for pain, discomfort, or fever as directed by your caregiver.   Follow a low-fat diet until seen again. Fat causes the gallbladder to contract, which can result in pain.   Follow up as instructed. Attacks are almost always recurrent and surgery is usually required for permanent treatment.  SEEK IMMEDIATE MEDICAL CARE IF:   Your pain increases and is not controlled by medications.   You have an oral temperature above 102 F (38.9 C), not controlled by  medication.   You develop nausea and vomiting.  MAKE SURE YOU:   Understand these instructions.   Will watch your condition.   Will get help right away if you are not doing well or get worse.  Document Released: 09/09/2005 Document Revised: 05/26/2011 Document Reviewed: 11/12/2010 [facility] Patient Information 2012 [place], [place].  Minimize spicy/fatty foods and avoid large meals

## 2011-10-04 NOTE — Telephone Encounter (Signed)
Patient has lost all of the papers she got this morning. Can she pick up the doctors instructions and a new Rx?

## 2011-10-05 ENCOUNTER — Ambulatory Visit (HOSPITAL_BASED_OUTPATIENT_CLINIC_OR_DEPARTMENT_OTHER)
Admission: RE | Admit: 2011-10-05 | Discharge: 2011-10-05 | Disposition: A | Discharge status: Home / Self Care | Payer: Managed Care, Other (non HMO) | Source: Ambulatory Visit | Attending: Family Medicine | Admitting: Family Medicine

## 2011-10-05 DIAGNOSIS — R1013 Epigastric pain: Secondary | ICD-10-CM

## 2011-10-05 DIAGNOSIS — R109 Unspecified abdominal pain: Secondary | ICD-10-CM | POA: Insufficient documentation

## 2011-10-05 DIAGNOSIS — R1011 Right upper quadrant pain: Secondary | ICD-10-CM | POA: Insufficient documentation

## 2011-10-05 LAB — HEPATIC FUNCTION PANEL
Alkaline Phosphatase: 55 U/L (ref 39–117)
Bilirubin, Direct: 0.1 mg/dL (ref 0.0–0.3)
Total Bilirubin: 0.9 mg/dL (ref 0.3–1.2)

## 2011-10-05 NOTE — Telephone Encounter (Signed)
Patient was notified and rx put in front cabinet

## 2011-10-06 ENCOUNTER — Telehealth: Payer: Self-pay

## 2011-10-06 MED ORDER — HYOSCYAMINE SULFATE 0.125 MG PO TABS: 0.1250 mg | ORAL_TABLET | ORAL | Status: DC | PRN

## 2011-10-06 NOTE — Telephone Encounter (Signed)
Left a message for patient to return my call and sent RX to pharmacy

## 2011-10-06 NOTE — Telephone Encounter (Signed)
Pt.notified

## 2011-10-06 NOTE — Telephone Encounter (Signed)
Message copied by Court Joy on Wed Oct 06, 2011  9:20 AM ------      Message from: Danise Edge A      Created: Tue Oct 05, 2011  4:46 PM       Notify normal ultrasound except for excessive bowel gas, have her try some Hyoscyamine 0.125 mg tab 1 tab po q 4 hours prn abdominal pain and/or gas, disp #40, 1 refill and make sure she is eating yogurt and taking a probiotic

## 2011-10-10 ENCOUNTER — Encounter: Payer: Self-pay | Admitting: Family Medicine

## 2011-10-10 NOTE — Progress Notes (Signed)
Patient ID: Annette Snow, female   DOB: September 02, 1961, 51 y.o.   MRN: [id number] Annette Snow [id number] 06/09/1961 10/10/2011      Progress Note-Follow Up  Subjective  Chief Complaint  Chief Complaint  Patient presents with  . Abdominal Pain    right rib cage pain X 2 1/2 weeks    HPI  Patient is a 51 year old Caucasian female who is admitted with worsening epigastric and right upper quadrant pain. She struggled with this in the past and has had an ultrasound which was negative. Her present though the pain is getting more intense and more persistent. It is worse after a meal. She is unaware if the type of meal makes a major difference although she does think that may contribute. No fevers, chills, change in bowels, just to comment chest pain, palpitations, shortness of breath, GU complaints noted today.  Past Medical History  Diagnosis Date  . Ulcer 1984    petic and duodenal  . IBS (irritable bowel syndrome)   . Migraines   . Hemorrhoids   . Blood transfusion   . 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 Neuropathy, sacral plexus 09/13/2011  . Osgood-Schlatter's disease 09/13/2011  . Endometriosis 09/13/2011  . Abdominal pain 09/13/2011  . Foot pain, right 09/13/2011  . Adenomatous colon polyp     Past Surgical History  Procedure Date  . Laparoscopic hysterectomy 2000    total for extensive endometriosis  . Bunionectomy     bilateral  . Ankle reconstruction     left @ age 73  . Knee arthroscopy     bilateral  3 on left 2 on right  . Breast enhancement surgery   . Wrist reconstruction     left twice  . Foot surgery     left twice  . Tonsillectomy and adenoidectomy   . Laparoscopy     multiple times for endometriosis on all organs  . Abdominal hysterectomy   . 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 Alzheimer's disease Maternal Grandmother   . Heart attack Maternal Grandfather   . Leukemia Paternal Grandfather   . Liver disease Sister   .  Cirrhosis Sister     primary biliary cirrhosis    History   Social History  . Marital Status: Married    Spouse Name: N/A    Number of Children: N/A  . Years of Education: N/A   Occupational History  . Not on file.   Social History Main Topics  . Smoking status: Former Smoker -- 0.0 packs/day  . Smokeless tobacco: Never Used  . Alcohol Use: 3.6 oz/week    6 Cans of beer per week  . Drug Use: Yes    Special: Marijuana     for pain  . Sexually Active: No   Other Topics Concern  . Not on file   Social History Narrative  . No narrative on file    Current Outpatient Prescriptions on File Prior to Visit  Medication Sig Dispense Refill  . Omega-3 Fatty Acids (FISH OIL PO) Take 1 tablet by mouth daily.        Marland Kitchen VIVELLE-DOT 0.1 MG/24HR Place 1 patch onto the skin 2 (two) times a week.        Allergies  Allergen Reactions  . Nucynta (Tapentadol Hydrochloride) Itching    Review of Systems  Review of Systems  Constitutional: Negative for fever and malaise/fatigue.  HENT: Negative for congestion.   Eyes: Negative for discharge.  Respiratory: Negative for shortness of breath.   Cardiovascular: Negative for chest pain, palpitations and leg swelling.  Gastrointestinal: Positive for abdominal pain. Negative for nausea, vomiting, diarrhea, blood in stool and melena.  Genitourinary: Negative for dysuria.  Musculoskeletal: Negative for falls.  Skin: Negative for rash.  Neurological: Negative for loss of consciousness and headaches.  Endo/Heme/Allergies: Negative for polydipsia.  Psychiatric/Behavioral: Negative for depression and suicidal ideas. The patient is not nervous/anxious and does not have insomnia.     Objective  BP 104/73  Pulse 75  Temp(Src) 99 F (37.2 C) (Temporal)  Ht 5\' 6"  (1.676 m)  Wt 129 lb 12.8 oz (58.877 kg)  BMI 20.95 kg/m2  SpO2 99%  Physical Exam  Physical Exam  Constitutional: She is oriented to person, place, and time and well-developed,  well-nourished, and in no distress. No distress.  HENT:  Head: Normocephalic and atraumatic.  Eyes: Conjunctivae are normal.  Neck: Neck supple. No thyromegaly present.  Cardiovascular: Normal rate, regular rhythm and normal heart sounds.   No murmur heard. Pulmonary/Chest: Effort normal and breath sounds normal. She has no wheezes.  Abdominal: Bowel sounds are normal. She exhibits no distension and no mass. There is tenderness. There is no rebound and no guarding.       Midline scar  Musculoskeletal: She exhibits no edema.  Lymphadenopathy:    She has no cervical adenopathy.  Neurological: She is alert and oriented to person, place, and time.  Skin: Skin is warm and dry. No rash noted. She is not diaphoretic.  Psychiatric: Memory, affect and judgment normal.    No results found for this basename: TSH   Lab Results  Component Value Date   WBC 6.1 10/04/2011   HGB 14.8 10/04/2011   HCT 44.2 10/04/2011   MCV 92.1 10/04/2011   PLT 264.0 10/04/2011   Lab Results  Component Value Date   CREATININE 0.8 10/04/2011   BUN 13 10/04/2011   NA 140 10/04/2011   K 4.1 10/04/2011   CL 104 10/04/2011   CO2 29 10/04/2011   Lab Results  Component Value Date   ALT 21 10/04/2011   AST 29 10/04/2011   ALKPHOS 55 10/04/2011   BILITOT 0.9 10/04/2011      Assessment & Plan   Abdominal pain Trouble with epigastric and right upper quadrant pain in the past and years ago did have a right upper quadrant ultrasound she reports was normal. At present she is having worsening epigastric pain, dyspepsia or blood in the tip of the right upper quadrant ultrasound and she is encouraged a bland diet with low fat andfor poor pushing symptoms. Start Zantac for the reflux

## 2011-10-12 ENCOUNTER — Ambulatory Visit: Payer: Managed Care, Other (non HMO) | Admitting: Internal Medicine

## 2011-10-14 NOTE — Assessment & Plan Note (Signed)
Trouble with epigastric and right upper quadrant pain in the past and years ago did have a right upper quadrant ultrasound she reports was normal. At present she is having worsening epigastric pain, dyspepsia or blood in the tip of the right upper quadrant ultrasound and she is encouraged a bland diet with low fat andfor poor pushing symptoms. Start Zantac for the reflux

## 2011-11-17 ENCOUNTER — Ambulatory Visit (INDEPENDENT_AMBULATORY_CARE_PROVIDER_SITE_OTHER): Payer: Managed Care, Other (non HMO) | Admitting: Family Medicine

## 2011-11-17 ENCOUNTER — Encounter: Payer: Self-pay | Admitting: Family Medicine

## 2011-11-17 ENCOUNTER — Other Ambulatory Visit: Payer: Self-pay | Admitting: Podiatry

## 2011-11-17 ENCOUNTER — Encounter (HOSPITAL_BASED_OUTPATIENT_CLINIC_OR_DEPARTMENT_OTHER): Payer: Self-pay | Admitting: *Deleted

## 2011-11-17 VITALS — BP 105/73 | HR 87 | Temp 99.2°F | Ht 66.0 in | Wt 126.1 lb

## 2011-11-17 DIAGNOSIS — Q8901 Asplenia (congenital): Secondary | ICD-10-CM

## 2011-11-17 DIAGNOSIS — Q8909 Congenital malformations of spleen: Secondary | ICD-10-CM

## 2011-11-17 DIAGNOSIS — M79671 Pain in right foot: Secondary | ICD-10-CM

## 2011-11-17 DIAGNOSIS — R9431 Abnormal electrocardiogram [ECG] [EKG]: Secondary | ICD-10-CM

## 2011-11-17 DIAGNOSIS — M79609 Pain in unspecified limb: Secondary | ICD-10-CM

## 2011-11-17 DIAGNOSIS — R109 Unspecified abdominal pain: Secondary | ICD-10-CM

## 2011-11-17 DIAGNOSIS — Z01818 Encounter for other preprocedural examination: Secondary | ICD-10-CM

## 2011-11-17 LAB — CBC
HCT: 41 % (ref 36.0–46.0)
MCHC: 33.6 g/dL (ref 30.0–36.0)
MCV: 93.8 fl (ref 78.0–100.0)
Platelets: 250 10*3/uL (ref 150.0–400.0)
RBC: 4.37 Mil/uL (ref 3.87–5.11)

## 2011-11-17 LAB — RENAL FUNCTION PANEL
Albumin: 4 g/dL (ref 3.5–5.2)
BUN: 13 mg/dL (ref 6–23)
Calcium: 9.3 mg/dL (ref 8.4–10.5)
Creatinine, Ser: 0.8 mg/dL (ref 0.4–1.2)
Glucose, Bld: 77 mg/dL (ref 70–99)
Phosphorus: 3.4 mg/dL (ref 2.3–4.6)

## 2011-11-17 LAB — HEPATIC FUNCTION PANEL
AST: 21 U/L (ref 0–37)
Albumin: 4 g/dL (ref 3.5–5.2)
Alkaline Phosphatase: 50 U/L (ref 39–117)
Total Protein: 6.4 g/dL (ref 6.0–8.3)

## 2011-11-17 NOTE — Patient Instructions (Signed)
Pre-Surgical and Post-Surgical Guidelines for Patients These are general sets of instructions and of course some guidelines do not apply when there is an emergency present and an operation must be performed immediately. During such times, surgery is often performed under less than desirable circumstances and the best must be made of this. PREPARING FOR SURGERY  Stop taking herbal supplements two weeks Prior To Surgery (PTS).   Stop smoking at least two weeks PTS. This lowers risk during surgery. Ask your doctor for help with this if needed. The benefits are great and this is a good time for a healthy lifestyle change.   Do not take aspirin within one week PTS unless instructed otherwise. Only take over-the-counter or prescription medicines for pain, discomfort, or fever as directed by your caregiver.   Do not take anti-inflammatory medications (such as Advil or Motrin for example) for forty-eight hours PTS.   Notify your doctor if you have been on steroids (cortisone) within the past 7 days whether taken by mouth or applied to the skin. THIS IS CRITICAL.   Your doctor will discuss possible risks and complications with you before surgery. In addition to the usual risks from anesthesia, other common risks and complications include:   Blood loss and replacement (does not apply to minor surgical procedures).   Temporary increase in pain due to surgery.   Uncorrected pain or problems the surgery was meant to correct.   Infection.   New damage.   For same day surgical patients: arrange for someone to take you home from the hospital and to stay with you for twenty-four hours after the procedure. Medicine given for your procedure may prevent you from driving a car or caring for yourself. If you have not made these arrangements , surgery may have to be canceled.   Surgery will also be canceled if you fail to follow instructions before surgery.  THE DAY BEFORE SURGERY  Eat your usual meals and  a light supper. Continue fluid intake. No alcohol intake.   Do not eat or drink after midnight. Your surgery will be canceled if you eat or drink after midnight. Take your usual medicine the morning of surgery with a sip of water unless instructed otherwise. Check with your doctor if you are unsure.   Do not smoke. The longer smoking is stopped before surgery, the less likely you will be to have pulmonary (lung) problems after surgery.   Call your doctor's office the morning prior to surgery if you develop an illness or problem which may prevent you from safely having your procedure.  THE MORNING OF SURGERY  Take medicine as directed with a sip of water.   Bathe or shower.   Remove makeup, nail polish and jewelry.   Bring your glasses or contact case. Dentures will be removed before surgery.   Wear loose fitting clothing and low or no-heeled shoes.   If you are a nursing mother, ask the anesthetist how long you should wait before nursing again.   Do not bring children without other another adult to be with them. Most medical centers are unable to provide for care and supervision of children.  LET YOUR CAREGIVER KNOW ABOUT:  Allergies.   Medicine taken including herbs, eye drops, over the counter medications, and creams.   Use of steroids (by mouth or creams).   Previous problems with anesthetics or novocaine.   A family history of anesthetic problems.   Possibility of pregnancy, if this applies.   History of blood   clots (thrombophlebitis).   History of bleeding or blood problems.   Previous surgery.   Other health problems.  BEFORE THE PROCEDURE You should be present 60 minutes prior to your procedure or as directed. Bring your insurance information with you. Do not bring valuables to the hospital. AFTER THE PROCEDURE After surgery, you will be taken to the recovery area where a nurse will monitor your progress. When you are awake, stable, and taking fluids well,  (barring complications), you will be allowed to go home. Once home, an ice pack applied to your operative site for fifteen or twenty minutes four times per day may help with discomfort, and keep swelling down. You may have numbness at and around the site of the incision. You should not operate heavy machines (cars) the day after surgery and any time you are taking narcotics for pain control. FOLLOWING SURGERY Same day surgery does not mean same day recovery. Healing will take some time. You will have tenderness at the operative site and there may be some swelling and bruising at the wound site or sites. You may have some nausea. The responsible adult who will take you home should arrange to stay with you the first 24 hours.  Do not drive for twenty-four hours or until feeling normal again or when instructed by your doctor. Do not drive while taking prescription pain medications.   Do not consume alcoholic beverages.   Do not make important decisions or sign legal documents. You may resume normal diet and activities as directed. Do no heavy lifting (more than 10 pounds) or playing of contact sports.   Change dressings as directed.   Only take over-the-counter or prescription medicines for pain, discomfort, or fever as directed by your caregiver.   Keep all appointments as scheduled and follow all instructions.   Ask questions if you do not understand something.   Make sure that you and your family fully understand everything about your operation.   Progressively return to your activity levels prior to surgery.  SEEK MEDICAL CARE IF:  Increased bleeding (more than a small spot) from wounds or biopsy sites.   Redness, swelling, or increasing pain in wounds or biopsy sites.   Pus coming from wound.   An unexplained temperature over 101 F (38.3 C) (orally).   A foul smell coming from the wound or dressing.   Development of light headedness or feeling faint.  SEEK IMMEDIATE MEDICAL CARE  IF :  You develop a rash.   Have difficulty breathing.   You develop allergies.  Document Released: 06/08/2001 Document Revised: 04/08/2011 Document Reviewed: 01/29/2008 [employer] Patient Information 2012 [employer]. 

## 2011-11-17 NOTE — Progress Notes (Signed)
Saw pcp today for h/p-ekg and labs done-[facility] dr-will be in epic Bring boot and crutches

## 2011-11-19 ENCOUNTER — Encounter: Payer: Self-pay | Admitting: Family Medicine

## 2011-11-19 DIAGNOSIS — R9431 Abnormal electrocardiogram [ECG] [EKG]: Secondary | ICD-10-CM

## 2011-11-19 HISTORY — DX: Abnormal electrocardiogram (ECG) (EKG): R94.31

## 2011-11-19 NOTE — Assessment & Plan Note (Signed)
On pre op ECG today Q waved noted in leads III and aVF. Is referred for cardiac clearance prior to surgery

## 2011-11-19 NOTE — Progress Notes (Signed)
Patient ID: Annette Snow, female   DOB: [birth year], 51 y.o.   MRN: [id number] Kassey Laforest [id number] 06-Dec-1960 11/19/2011      Progress Note-Follow Up  Subjective  Chief Complaint  Chief Complaint  Patient presents with  . Follow-up    1 month follow up    HPI  51 year old Caucasian female who is in today for preop clearance. She has a long history of right foot pain. Previous surgical correction actually left her withworsening pain and deformity in that foot. She has surgery scheduled for 11/24/2011 with her foot doctor at [facility] to have her foot surgically corrected. They plan to put her under her and treat her with pins in her first second and third toes. She denies any other acute complaints. She continues to have some discomfort in her abdomen but it is greatly improved. Her scar has been recently treated with steroid injections in her abdomen and that has been helpful. She is denying any chest pain, palpitations, shortness of breath, recent illness, GI or GU complaints at this time.  Past Medical History  Diagnosis Date  . Ulcer 1984    petic and duodenal  . IBS (irritable bowel syndrome)   . Migraines   . Hemorrhoids   . Blood transfusion   . 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 Neuropathy, sacral plexus 09/13/2011  . Osgood-Schlatter's disease 09/13/2011  . Endometriosis 09/13/2011  . Abdominal pain 09/13/2011  . Foot pain, right 09/13/2011  . Adenomatous colon polyp   . IBS (irritable bowel syndrome)   . ECG abnormal 11/19/2011    Past Surgical History  Procedure Date  . Laparoscopic hysterectomy 2000    total for extensive endometriosis  . Bunionectomy     bilateral  . Ankle reconstruction     left @ age 16  . Knee arthroscopy     bilateral  3 on left 2 on right  . Breast enhancement surgery   . Wrist reconstruction     left twice  . Foot surgery     left twice  . Tonsillectomy and adenoidectomy   . Laparoscopy     multiple times for endometriosis on  all organs  . Abdominal hysterectomy   . 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 Alzheimer's disease Maternal Grandmother   . Heart attack Maternal Grandfather   . Leukemia Paternal Grandfather   . Liver disease Sister   . Cirrhosis Sister     primary biliary cirrhosis  . Heart disease Father     rheumatic heart disease    History   Social History  . Marital Status: Married    Spouse Name: N/A    Number of Children: N/A  . Years of Education: N/A   Occupational History  . Not on file.   Social History Main Topics  . Smoking status: Former Smoker -- 0.0 packs/day    Quit date: 11/16/2008  . Smokeless tobacco: Never Used  . Alcohol Use: 3.6 oz/week    6 Cans of beer per week  . Drug Use: Yes    Special: Marijuana     for pain  . Sexually Active: No   Other Topics Concern  . Not on file   Social History Narrative  . No narrative on file    Current Outpatient Prescriptions on File Prior to Visit  Medication Sig Dispense Refill  . Omega-3 Fatty Acids (FISH OIL PO) Take 1 tablet by mouth daily.        Marland Kitchen  Probiotic Product (PROBIOTIC FORMULA PO) Take by mouth.        . triamcinolone cream (KENALOG) 0.1 %       . VIVELLE-DOT 0.1 MG/24HR Place 1 patch onto the skin 2 (two) times a week.      Marland Kitchen HYDROcodone-acetaminophen (NORCO) 5-325 MG per tablet       . hyoscyamine (LEVSIN, ANASPAZ) 0.125 MG tablet Take 1 tablet (0.125 mg total) by mouth every 4 (four) hours as needed. For abdominal pain and/or gas  40 tablet  1  . oxyCODONE-acetaminophen (PERCOCET) 5-325 MG per tablet Take 1 tablet by mouth every 8 (eight) hours as needed for pain (abdominal pain).  10 tablet  0  . promethazine (PHENERGAN) 12.5 MG tablet Take 1 tablet (12.5 mg total) by mouth every 8 (eight) hours as needed for nausea.  60 tablet  1    Allergies  Allergen Reactions  . Nucynta (Tapentadol Hydrochloride) Itching    Review of Systems  Review of Systems    Constitutional: Negative for fever and malaise/fatigue.  HENT: Negative for congestion.   Eyes: Negative for discharge.  Respiratory: Negative for shortness of breath.   Cardiovascular: Negative for chest pain, palpitations and leg swelling.  Gastrointestinal: Positive for abdominal pain. Negative for nausea and diarrhea.  Genitourinary: Negative for dysuria.  Musculoskeletal: Positive for joint pain. Negative for falls.       Right foot pain  Skin: Negative for rash.  Neurological: Negative for loss of consciousness and headaches.  Endo/Heme/Allergies: Negative for polydipsia.  Psychiatric/Behavioral: Negative for depression and suicidal ideas. The patient is not nervous/anxious and does not have insomnia.     Objective  BP 105/73  Pulse 87  Temp(Src) 99.2 F (37.3 C) (Temporal)  Ht 5\' 6"  (1.676 m)  Wt 126 lb 1.9 oz (57.208 kg)  BMI 20.36 kg/m2  SpO2 97%  Physical Exam  Physical Exam  Constitutional: She is oriented to person, place, and time and well-developed, well-nourished, and in no distress. No distress.  HENT:  Head: Normocephalic and atraumatic.  Eyes: Conjunctivae are normal.  Neck: Neck supple. No thyromegaly present.  Cardiovascular: Normal rate, regular rhythm and normal heart sounds.   No murmur heard. Pulmonary/Chest: Effort normal and breath sounds normal. She has no wheezes.  Abdominal: Soft. Bowel sounds are normal. She exhibits no distension and no mass. There is no rebound and no guarding.       Midline vertical scar   Musculoskeletal: She exhibits no edema.  Lymphadenopathy:    She has no cervical adenopathy.  Neurological: She is alert and oriented to person, place, and time.  Skin: Skin is warm and dry. No rash noted. She is not diaphoretic.  Psychiatric: Memory, affect and judgment normal.    No results found for this basename: TSH   Lab Results  Component Value Date   WBC 6.5 11/17/2011   HGB 13.8 11/17/2011   HCT 41.0 11/17/2011   MCV  93.8 11/17/2011   PLT 250.0 11/17/2011   Lab Results  Component Value Date   CREATININE 0.8 11/17/2011   BUN 13 11/17/2011   NA 137 11/17/2011   K 4.6 11/17/2011   CL 100 11/17/2011   CO2 30 11/17/2011   Lab Results  Component Value Date   ALT 20 11/17/2011   AST 21 11/17/2011   ALKPHOS 50 11/17/2011   BILITOT 1.2 11/17/2011      Assessment & Plan  Foot pain, right Here today for Pre op clearance to have her foot  surgically corrected. The surgery is planned for 11/24/2011 at [facility]. She will need to under go anesthesia due to the extensive nature of the surgery. She needs her first 3 toes to be broken and straightened, ultimately to have pins placed. She is asymptomatic and doing well but unfortunately during pre op clearance she showed new Q waves present in her inferior leads. We have been able to get her an appt on 11/22/11 [facility] and await further input from them. She has been instructed to avoid Fatty acid supplements and NSAIDs until after the surgery at this time.  Abdominal pain Is having steroid injections in her abdominal scar with good results, the scar is shrinking and the pain is improving.  ECG abnormal On pre op ECG today Q waved noted in leads III and aVF. Is referred for cardiac clearance prior to surgery  Spleen absent Has had Pneumovax

## 2011-11-19 NOTE — Assessment & Plan Note (Signed)
Here today for Pre op clearance to have her foot surgically corrected. The surgery is planned for 11/24/2011 at [facility]. She will need to under go anesthesia due to the extensive nature of the surgery. She needs her first 3 toes to be broken and straightened, ultimately to have pins placed. She is asymptomatic and doing well but unfortunately during pre op clearance she showed new Q waves present in her inferior leads. We have been able to get her an appt on 11/22/11 [facility] cardiac [facility] and await further input from them. She has been instructed to avoid Fatty acid supplements and NSAIDs until after the surgery at this time.

## 2011-11-19 NOTE — Assessment & Plan Note (Signed)
Has had Pneumovax

## 2011-11-19 NOTE — Assessment & Plan Note (Signed)
Is having steroid injections in her abdominal scar with good results, the scar is shrinking and the pain is improving.

## 2011-11-24 ENCOUNTER — Ambulatory Visit (HOSPITAL_BASED_OUTPATIENT_CLINIC_OR_DEPARTMENT_OTHER): Payer: Managed Care, Other (non HMO) | Admitting: Anesthesiology

## 2011-11-24 ENCOUNTER — Encounter (HOSPITAL_BASED_OUTPATIENT_CLINIC_OR_DEPARTMENT_OTHER): Payer: Self-pay | Admitting: Anesthesiology

## 2011-11-24 ENCOUNTER — Encounter (HOSPITAL_BASED_OUTPATIENT_CLINIC_OR_DEPARTMENT_OTHER): Payer: Self-pay | Admitting: Certified Registered"

## 2011-11-24 ENCOUNTER — Encounter (HOSPITAL_BASED_OUTPATIENT_CLINIC_OR_DEPARTMENT_OTHER)
Admission: RE | Disposition: A | Discharge status: Home / Self Care | Payer: Self-pay | Source: Ambulatory Visit | Attending: Podiatry

## 2011-11-24 ENCOUNTER — Encounter (HOSPITAL_BASED_OUTPATIENT_CLINIC_OR_DEPARTMENT_OTHER): Payer: Self-pay | Admitting: *Deleted

## 2011-11-24 ENCOUNTER — Ambulatory Visit (HOSPITAL_BASED_OUTPATIENT_CLINIC_OR_DEPARTMENT_OTHER)
Admission: RE | Admit: 2011-11-24 | Discharge: 2011-11-24 | Disposition: A | Discharge status: Home / Self Care | Payer: Managed Care, Other (non HMO) | Source: Ambulatory Visit | Attending: Podiatry | Admitting: Podiatry

## 2011-11-24 DIAGNOSIS — R51 Headache: Secondary | ICD-10-CM | POA: Insufficient documentation

## 2011-11-24 DIAGNOSIS — M2040 Other hammer toe(s) (acquired), unspecified foot: Secondary | ICD-10-CM | POA: Insufficient documentation

## 2011-11-24 DIAGNOSIS — M205X9 Other deformities of toe(s) (acquired), unspecified foot: Secondary | ICD-10-CM | POA: Insufficient documentation

## 2011-11-24 DIAGNOSIS — Q799 Congenital malformation of musculoskeletal system, unspecified: Secondary | ICD-10-CM | POA: Insufficient documentation

## 2011-11-24 HISTORY — PX: METATARSAL OSTEOTOMY: SHX1641

## 2011-11-24 LAB — POCT HEMOGLOBIN-HEMACUE: Hemoglobin: 13 g/dL (ref 12.0–15.0)

## 2011-11-24 SURGERY — OSTEOTOMY, METATARSAL BONE
Anesthesia: Monitor Anesthesia Care | Site: Foot | Laterality: Right | Wound class: Clean

## 2011-11-24 MED ORDER — FENTANYL CITRATE 0.05 MG/ML IJ SOLN
INTRAMUSCULAR | Status: DC | PRN
  Administered 2011-11-24: 100 ug via INTRAVENOUS
  Administered 2011-11-24 (×2): 25 ug via INTRAVENOUS

## 2011-11-24 MED ORDER — EPHEDRINE SULFATE 50 MG/ML IJ SOLN
INTRAMUSCULAR | Status: DC | PRN
  Administered 2011-11-24: 10 mg via INTRAVENOUS

## 2011-11-24 MED ORDER — MIDAZOLAM HCL 5 MG/5ML IJ SOLN
INTRAMUSCULAR | Status: DC | PRN
  Administered 2011-11-24: 2 mg via INTRAVENOUS

## 2011-11-24 MED ORDER — PROPOFOL 10 MG/ML IV EMUL
INTRAVENOUS | Status: DC | PRN
  Administered 2011-11-24: 75 ug/kg/min via INTRAVENOUS

## 2011-11-24 MED ORDER — BUPIVACAINE HCL (PF) 0.5 % IJ SOLN
INTRAMUSCULAR | Status: DC | PRN
  Administered 2011-11-24: 7.5 mL

## 2011-11-24 MED ORDER — CEFAZOLIN SODIUM 1-5 GM-% IV SOLN
INTRAVENOUS | Status: DC | PRN
  Administered 2011-11-24: 1 g via INTRAVENOUS

## 2011-11-24 MED ORDER — LACTATED RINGERS IV SOLN
INTRAVENOUS | Status: DC
  Administered 2011-11-24 (×2): via INTRAVENOUS

## 2011-11-24 MED ORDER — SODIUM CHLORIDE 0.9 % IR SOLN
Status: DC | PRN
  Administered 2011-11-24: 14:00:00

## 2011-11-24 MED ORDER — ONDANSETRON HCL 4 MG/2ML IJ SOLN
INTRAMUSCULAR | Status: DC | PRN
  Administered 2011-11-24: 4 mg via INTRAVENOUS

## 2011-11-24 MED ORDER — DEXAMETHASONE SODIUM PHOSPHATE 10 MG/ML IJ SOLN
INTRAMUSCULAR | Status: DC | PRN
  Administered 2011-11-24: 20 mg

## 2011-11-24 MED ORDER — CHLORHEXIDINE GLUCONATE 4 % EX LIQD: 60.0000 mL | Freq: Once | CUTANEOUS | Status: DC

## 2011-11-24 MED ORDER — LIDOCAINE HCL 2 % IJ SOLN
INTRAMUSCULAR | Status: DC | PRN
  Administered 2011-11-24: 8 mL
  Administered 2011-11-24: 7.5 mL

## 2011-11-24 MED ORDER — PROPOFOL 10 MG/ML IV EMUL
INTRAVENOUS | Status: DC | PRN
  Administered 2011-11-24: 10 mg via INTRAVENOUS
  Administered 2011-11-24: 100 mg via INTRAVENOUS

## 2011-11-24 SURGICAL SUPPLY — 48 items
4.5 interphalangeal rod ×2 IMPLANT
BAG DECANTER FOR FLEXI CONT (MISCELLANEOUS) ×2 IMPLANT
BANDAGE CONFORM 3  STR LF (GAUZE/BANDAGES/DRESSINGS) ×2 IMPLANT
BANDAGE ELASTIC 3 VELCRO ST LF (GAUZE/BANDAGES/DRESSINGS) ×2 IMPLANT
BLADE CCA MICRO SAG (BLADE) ×4 IMPLANT
BLADE SURG 15 STRL LF DISP TIS (BLADE) ×3 IMPLANT
BLADE SURG 15 STRL SS (BLADE) ×3
BNDG ESMARK 4X9 LF (GAUZE/BANDAGES/DRESSINGS) ×2 IMPLANT
CAP PIN PROTECTOR ORTHO WHT (CAP) ×2 IMPLANT
COVER MAYO STAND STRL (DRAPES) ×2 IMPLANT
COVER TABLE BACK 60X90 (DRAPES) ×2 IMPLANT
DECANTER SPIKE VIAL GLASS SM (MISCELLANEOUS) IMPLANT
DRAPE EXTREMITY T 121X128X90 (DRAPE) ×2 IMPLANT
DRAPE OEC MINIVIEW 54X84 (DRAPES) ×2 IMPLANT
DRILL BIT WIRE PASS (BIT) IMPLANT
DRILL WIRE PASS MED (BIT) IMPLANT
DURAPREP 26ML APPLICATOR (WOUND CARE) ×2 IMPLANT
ELECT REM PT RETURN 9FT ADLT (ELECTROSURGICAL)
ELECTRODE REM PT RTRN 9FT ADLT (ELECTROSURGICAL) IMPLANT
GAUZE SPONGE 4X4 16PLY XRAY LF (GAUZE/BANDAGES/DRESSINGS) ×2 IMPLANT
GAUZE XEROFORM 1X8 LF (GAUZE/BANDAGES/DRESSINGS) ×2 IMPLANT
GLOVE BIO SURGEON STRL SZ7.5 (GLOVE) ×2 IMPLANT
GLOVE ECLIPSE 6.5 STRL STRAW (GLOVE) ×2 IMPLANT
GOWN PREVENTION PLUS XLARGE (GOWN DISPOSABLE) ×6 IMPLANT
GOWN PREVENTION PLUS XXLARGE (GOWN DISPOSABLE) IMPLANT
INTERPHALANGEAL KIT ×2 IMPLANT
KWIRE 4.0 X .045IN (WIRE) ×2 IMPLANT
NEEDLE HYPO 25X1 1.5 SAFETY (NEEDLE) ×6 IMPLANT
PACK BASIN DAY SURGERY FS (CUSTOM PROCEDURE TRAY) ×2 IMPLANT
PADDING CAST ABS 4INX4YD NS (CAST SUPPLIES) ×1
PADDING CAST ABS COTTON 4X4 ST (CAST SUPPLIES) ×1 IMPLANT
PENCIL BUTTON HOLSTER BLD 10FT (ELECTRODE) IMPLANT
SCREW CORTEX ST 2.0X14 (Screw) ×4 IMPLANT
SPONGE GAUZE 2X2 8PLY STRL LF (GAUZE/BANDAGES/DRESSINGS) IMPLANT
SPONGE GAUZE 4X4 12PLY (GAUZE/BANDAGES/DRESSINGS) ×2 IMPLANT
STOCKINETTE 6  STRL (DRAPES) ×1
STOCKINETTE 6 STRL (DRAPES) ×1 IMPLANT
SUT ETHILON 5 0 PS 2 18 (SUTURE) IMPLANT
SUT MNCRL AB 3-0 PS2 18 (SUTURE) IMPLANT
SUT MNCRL AB 4-0 PS2 18 (SUTURE) ×6 IMPLANT
SUT MON AB 5-0 PS2 18 (SUTURE) ×4 IMPLANT
SUT VIC AB 3-0 FS2 27 (SUTURE) ×2 IMPLANT
SUT VIC AB 5-0 PS2 18 (SUTURE) IMPLANT
SYR 3ML 18GX1 1/2 (SYRINGE) ×2 IMPLANT
SYR BULB 3OZ (MISCELLANEOUS) ×2 IMPLANT
SYRINGE 10CC LL (SYRINGE) ×2 IMPLANT
UNDERPAD 30X30 INCONTINENT (UNDERPADS AND DIAPERS) ×2 IMPLANT
WATER STERILE IRR 1000ML POUR (IV SOLUTION) IMPLANT

## 2011-11-24 NOTE — Progress Notes (Signed)
Spoke with Dr. Beverlee Nims Nurse Vikki Ports) told her pt had size of dime on great toe dressing bloody drainage. Reinforce dressing.

## 2011-11-24 NOTE — Progress Notes (Signed)
Notified Dr. Charlsie Merles of pt's second and third toe bluish in color--- wants foot lowered in dependent position. Lowered foot

## 2011-11-24 NOTE — Discharge Instructions (Signed)
See attached [facility]  [address] [place], [place] [address] [phone]   Post Anesthesia Home Care Instructions  Activity: Get plenty of rest for the remainder of the day. A responsible adult should stay with you for 24 hours following the procedure.  For the next 24 hours, DO NOT: -Drive a car -Advertising copywriter -Drink alcoholic beverages -Take any medication unless instructed by your physician -Make any legal decisions or sign important papers.  Meals: Start with liquid foods such as gelatin or soup. Progress to regular foods as tolerated. Avoid greasy, spicy, heavy foods. If nausea and/or vomiting occur, drink only clear liquids until the nausea and/or vomiting subsides. Call your physician if vomiting continues.  Special Instructions/Symptoms: Your throat may feel dry or sore from the anesthesia or the breathing tube placed in your throat during surgery. If this causes discomfort, gargle with warm salt water. The discomfort should disappear within 24 hours.

## 2011-11-24 NOTE — Op Note (Signed)
[id number] dictated number

## 2011-11-24 NOTE — Anesthesia Postprocedure Evaluation (Signed)
Anesthesia Post Note  Patient: Annette Snow  Procedure(s) Performed: Procedure(s) (LRB): METATARSAL OSTEOTOMY (Right)  Anesthesia type: MAC  Patient location: PACU  Post pain: Pain level controlled  Post assessment: Patient's Cardiovascular Status Stable  Last Vitals:  Filed Vitals:   11/24/11 1530  BP: 107/71  Pulse: 66  Temp:   Resp: 13    Post vital signs: Reviewed and stable  Level of consciousness: alert  Complications: No apparent anesthesia complications

## 2011-11-24 NOTE — Transfer of Care (Signed)
Immediate Anesthesia Transfer of Care Note  Patient: Annette Snow  Procedure(s) Performed: Procedure(s) (LRB): METATARSAL OSTEOTOMY (Right)  Patient Location: PACU  Anesthesia Type: MAC combined with regional for post-op pain  Level of Consciousness: awake, alert , oriented and patient cooperative  Airway & Oxygen Therapy: Patient Spontanous Breathing and Patient connected to face mask oxygen  Post-op Assessment: Report given to PACU RN and Post -op Vital signs reviewed and stable  Post vital signs: Reviewed and stable  Complications: No apparent anesthesia complications

## 2011-11-24 NOTE — Anesthesia Preprocedure Evaluation (Signed)
Anesthesia Evaluation  Patient identified by MRN, date of birth, ID band Patient awake    Reviewed: Allergy & Precautions, H&P , NPO status , Patient's Chart, lab work & pertinent test results, reviewed documented beta blocker date and time   Airway Mallampati: II TM Distance: >3 FB Neck ROM: full    Dental   Pulmonary neg pulmonary ROS,          Cardiovascular neg cardio ROS + Valvular Problems/Murmurs MR     Neuro/Psych  Headaches, Negative Psych ROS   GI/Hepatic negative GI ROS, Neg liver ROS,   Endo/Other  Negative Endocrine ROS  Renal/GU negative Renal ROS  Genitourinary negative   Musculoskeletal   Abdominal   Peds  Hematology negative hematology ROS (+)   Anesthesia Other Findings See surgeon's H&P   Reproductive/Obstetrics negative OB ROS                           Anesthesia Physical Anesthesia Plan  ASA: III  Anesthesia Plan: MAC   Post-op Pain Management:    Induction:   Airway Management Planned: Simple Face Mask  Additional Equipment:   Intra-op Plan:   Post-operative Plan:   Informed Consent: I have reviewed the patients History and Physical, chart, labs and discussed the procedure including the risks, benefits and alternatives for the proposed anesthesia with the patient or authorized representative who has indicated his/her understanding and acceptance.     Plan Discussed with: CRNA and Surgeon  Anesthesia Plan Comments:         Anesthesia Quick Evaluation

## 2011-11-24 NOTE — Anesthesia Procedure Notes (Signed)
Procedure Name: MAC Date/Time: 11/24/2011 1:10 PM Performed by: Jearld Shines Pre-anesthesia Checklist: Patient identified, Emergency Drugs available, Suction available, Patient being monitored and Timeout performed Oxygen Delivery Method: Simple face mask

## 2011-11-25 NOTE — Op Note (Signed)
NAME:  Annette Snow, Annette Snow                ACCOUNT NO.:  [id number]  MEDICAL RECORD NO.:  [id number]  LOCATION:  XRAY                          FACILITY:  [facility]  PHYSICIAN:  Davon Abdelaziz S. Sona Nations, D.P.M.DATE OF BIRTH:  06/19/1961  DATE OF PROCEDURE:  11/24/2011 DATE OF DISCHARGE:                              OPERATIVE REPORT   SURGEON:  Radiance Deady S. Ainslie Mazurek, DPM.  ASSISTANT:  Gregory Mayer, DPM.  PREOPERATIVE DIAGNOSES: 1. Hallux interphalangeus, right. 2. Hammertoe deformity digit 2, right. 3. Hammertoe deformity digit 3, right. 4. Elongated metatarsal second, right. 5. Elongated metatarsal third, right.  POSTOPERATIVE DIAGNOSES: 1. Hallux interphalangeus, right. 2. Hammertoe deformity digit 2, right. 3. Hammertoe deformity digit 3, right. 4. Elongated metatarsal second, right. 5. Elongated metatarsal third, right.  PROCEDURE: 1. Akin osteotomy 28-gauge monofilament wire, right. 2. Transpositional osteotomy second metatarsal, right 2.0 self-tapping     14-mm cork screw fixation. 3. Osteomed implant interphalangeal joint second digit right 4.5 mm in     size. 4. Osteotomy third metatarsal right 2.0 self-tapping 14-mm cork screw     fixation. 5. Digital fusion, digit 3 right 4-5 external care fixation.  INDICATIONS:  Chronic discomfort, inability to walk without difficulty. She has tried wider shoes.  She has tried antiinflammatories.  We have tried splinting techniques without relief of symptoms.  This is revisional surgery from previous surgery.  FINDINGS AND PROCEDURE:  The patient was brought to the OR and placed in supine position on the OR table.  The patient was injected with a total of 15 mL Xylocaine and Marcaine mixture.  The patient's right foot was prepped and draped utilizing standard aseptic technique.  The right foot was exsanguinated utilizing Esmarch and right ankle tourniquet was inflated to 250 mmHg.  The following procedures were performed.  PROCEDURES: 1. Akin  osteotomy, right dorsal aspect digit hallux right, where an     approximate 6 cm incision was made medial to the extensor tendon.     The incision was deepened through subcutaneous tissue down the     capsule.  Linear capsular incision was performed.  The capillary     tissue was sharply dissected off the underlying bone and the bone     was exposed.  We then made a osteotomy cut in the right hallux.  A     wedge was taken out medially, so as to reduce the elevated     interphalangeus angle.  It was then fixated with 28-gauge     monofilament wire and was found to be in excellent position.  The     wound was flushed.  The subcutaneous tissue reapproximated 10     utilizing 4-0 Monocryl.  The subcutaneous tissue was reapproximated     4-0 Monocryl and the skin 5-0 Monocryl. 2. Shortening transpositional osteotomy second metatarsal right where     a 5-cm linear incision was made.  The incision was deepened through     subcutaneous tissue down the capsule with hemostasis being acquired     as necessary.  A linear capsular incision performed in the neck and     the shaft of the second metatarsal and the capsule was sharply       dissected off the underlying bone.  An osteotomy was performed from     the distal dorsal to proximal plantar direction through and     through.  The capital fragment was transposed both in the medial     and slightly plantar direction.  It was fixated with 2-0 self-     tapping cork screw fixation 14 mm. 3. Osteomed implant interphalangeal joint second digit right.     Attention was directed to the second digit right where a 4-cm semi-     elliptical incision was performed.  The incision was deepened     through down the extensor expansion and was moved in toto. The     extensor expansion was opened at the level of the interphalangeal     joint.  There was scar tissue here from previous surgery.  This was     cleaned out.  A small bit of bone was taken off the head of  the     proximal phalanx.  We then drilled both the proximal and middle     phalanx to allow for the wings of the Osteomed of 4-5 ball implant     which was placed in and found to be in excellent position.  Rep     also checked it and we resewed the tendon with 4-0 Monocryl.  We     then sewed capsule with 4-0 Monocryl subcutaneous tissue, 4-0     Monocryl, and skin with 5-0 Monocryl subcuticular fashion. 4. Osteotomy third metatarsal right, attention directed to dorsal     aspect of third metatarsal right where a 4-cm incision was     performed.  The incision was deepened through subcutaneous tissue     down to capsule with hemostasis being acquired as necessary.  The     capsular incision was performed over the head and neck of the third     metatarsal.  The capsular tissue was sharp.  They dissected off the     underlying bone and an osteotomy was performed from distal dorsal     to proximal plantar direction.  The third metatarsal was transposed     slightly medial and slightly plantarflexed, it was then fixated     with 2.0 self-tapping 14-mm cork screw fixation.  Closure will     follow up the following procedure. 5. Digital fusion, digit 3 right, semi-elliptical incision was     performed over the interphalangeal joint digit 3, right.  Incision     was deepened down to the extensor expansion. The intervening skin     was removed in toto.  Transverse incision with the extensor     expansion at the level of the interphalangeal joint was made and     the cartilage surfaces of the head and at the proximal phalanx base     of the middle phalanx were removed in toto.  The wound was flushed.     It was then fused with a 4-5 external care fixation.  Excellent     position was found.  Closure capsule subcutaneous 4-0 Monocryl and     skin subcuticular 5-0 Monocryl.  Surgical site was then infiltrated     with 8 mL of Marcaine, 2 mL dexamethasone.  Sterile dressing was     applied and  tourniquet was released with capillary refill noted to     be immediate in all digits on the toes.  The patient was taken to       recovery in satisfactory condition.  Prescription for Demerol and     Phenergan.  All postop instructions given to the patient.     Annette Snow, D.P.M.   ______________________________ Lukah Goswami S. Tamsin Nader, D.P.M.    NSR/MEDQ  D:  11/24/2011  T:  11/25/2011  Job:  [id number] 

## 2011-11-25 NOTE — Op Note (Deleted)
NAMEBABETTE, Annette Snow                ACCOUNT NO.:  [id number]  MEDICAL RECORD NO.:  [id number]  LOCATION:  XRAY                          FACILITY:  [facility]  PHYSICIAN:  Lenn Sink, D.P.M.DATE OF BIRTH:  04-09-1961  DATE OF PROCEDURE:  11/24/2011 DATE OF DISCHARGE:                              OPERATIVE REPORT   SURGEON:  Lenn Sink, DPM.  ASSISTANT:  Helane Gunther, DPM.  PREOPERATIVE DIAGNOSES: 1. Hallux interphalangeus, right. 2. Hammertoe deformity digit 2, right. 3. Hammertoe deformity digit 3, right. 4. Elongated metatarsal second, right. 5. Elongated metatarsal third, right.  POSTOPERATIVE DIAGNOSES: 1. Hallux interphalangeus, right. 2. Hammertoe deformity digit 2, right. 3. Hammertoe deformity digit 3, right. 4. Elongated metatarsal second, right. 5. Elongated metatarsal third, right.  PROCEDURE: 1. Akin osteotomy 28-gauge monofilament wire, right. 2. Transpositional osteotomy second metatarsal, right 2.0 self-tapping     14-mm cork screw fixation. 3. Osteomed implant interphalangeal joint second digit right 4.5 mm in     size. 4. Osteotomy third metatarsal right 2.0 self-tapping 14-mm cork screw     fixation. 5. Digital fusion, digit 3 right 4-5 external care fixation.  INDICATIONS:  Chronic discomfort, inability to walk without difficulty. She has tried wider shoes.  She has tried antiinflammatories.  We have tried splinting techniques without relief of symptoms.  This is revisional surgery from previous surgery.  FINDINGS AND PROCEDURE:  The patient was brought to the OR and placed in supine position on the OR table.  The patient was injected with a total of 15 mL Xylocaine and Marcaine mixture.  The patient's right foot was prepped and draped utilizing standard aseptic technique.  The right foot was exsanguinated utilizing Esmarch and right ankle tourniquet was inflated to 250 mmHg.  The following procedures were performed.  PROCEDURES: 1. Akin  osteotomy, right dorsal aspect digit hallux right, where an     approximate 6 cm incision was made medial to the extensor tendon.     The incision was deepened through subcutaneous tissue down the     capsule.  Linear capsular incision was performed.  The capillary     tissue was sharply dissected off the underlying bone and the bone     was exposed.  We then made a osteotomy cut in the right hallux.  A     wedge was taken out medially, so as to reduce the elevated     interphalangeus angle.  It was then fixated with 28-gauge     monofilament wire and was found to be in excellent position.  The     wound was flushed.  The subcutaneous tissue reapproximated 10     utilizing 4-0 Monocryl.  The subcutaneous tissue was reapproximated     4-0 Monocryl and the skin 5-0 Monocryl. 2. Shortening transpositional osteotomy second metatarsal right where     a 5-cm linear incision was made.  The incision was deepened through     subcutaneous tissue down the capsule with hemostasis being acquired     as necessary.  A linear capsular incision performed in the neck and     the shaft of the second metatarsal and the capsule was sharply  dissected off the underlying bone.  An osteotomy was performed from     the distal dorsal to proximal plantar direction through and     through.  The capital fragment was transposed both in the medial     and slightly plantar direction.  It was fixated with 2-0 self-     tapping cork screw fixation 14 mm. 3. Osteomed implant interphalangeal joint second digit right.     Attention was directed to the second digit right where a 4-cm semi-     elliptical incision was performed.  The incision was deepened     through down the extensor expansion and was moved in toto. The     extensor expansion was opened at the level of the interphalangeal     joint.  There was scar tissue here from previous surgery.  This was     cleaned out.  A small bit of bone was taken off the head of  the     proximal phalanx.  We then drilled both the proximal and middle     phalanx to allow for the wings of the Osteomed of 4-5 ball implant     which was placed in and found to be in excellent position.  Rep     also checked it and we resewed the tendon with 4-0 Monocryl.  We     then sewed capsule with 4-0 Monocryl subcutaneous tissue, 4-0     Monocryl, and skin with 5-0 Monocryl subcuticular fashion. 4. Osteotomy third metatarsal right, attention directed to dorsal     aspect of third metatarsal right where a 4-cm incision was     performed.  The incision was deepened through subcutaneous tissue     down to capsule with hemostasis being acquired as necessary.  The     capsular incision was performed over the head and neck of the third     metatarsal.  The capsular tissue was sharp.  They dissected off the     underlying bone and an osteotomy was performed from distal dorsal     to proximal plantar direction.  The third metatarsal was transposed     slightly medial and slightly plantarflexed, it was then fixated     with 2.0 self-tapping 14-mm cork screw fixation.  Closure will     follow up the following procedure. 5. Digital fusion, digit 3 right, semi-elliptical incision was     performed over the interphalangeal joint digit 3, right.  Incision     was deepened down to the extensor expansion. The intervening skin     was removed in toto.  Transverse incision with the extensor     expansion at the level of the interphalangeal joint was made and     the cartilage surfaces of the head and at the proximal phalanx base     of the middle phalanx were removed in toto.  The wound was flushed.     It was then fused with a 4-5 external care fixation.  Excellent     position was found.  Closure capsule subcutaneous 4-0 Monocryl and     skin subcuticular 5-0 Monocryl.  Surgical site was then infiltrated     with 8 mL of Marcaine, 2 mL dexamethasone.  Sterile dressing was     applied and  tourniquet was released with capillary refill noted to     be immediate in all digits on the toes.  The patient was taken to  recovery in satisfactory condition.  Prescription for Demerol and     Phenergan.  All postop instructions given to the patient.     Lenn Sink, D.P.M.   ______________________________ Lenn Sink, D.P.M.    NSR/MEDQ  D:  11/24/2011  T:  11/25/2011  Job:  [id number]

## 2011-11-29 ENCOUNTER — Ambulatory Visit: Payer: Managed Care, Other (non HMO)

## 2011-12-03 ENCOUNTER — Encounter (HOSPITAL_BASED_OUTPATIENT_CLINIC_OR_DEPARTMENT_OTHER): Payer: Self-pay | Admitting: Podiatry

## 2011-12-06 ENCOUNTER — Ambulatory Visit: Payer: Managed Care, Other (non HMO) | Admitting: Family Medicine

## 2011-12-21 ENCOUNTER — Telehealth: Payer: Self-pay | Admitting: Family Medicine

## 2011-12-21 MED ORDER — CEPHALEXIN 500 MG PO CAPS: 500.0000 mg | ORAL_CAPSULE | Freq: Four times a day (QID) | ORAL | Status: DC

## 2011-12-21 NOTE — Telephone Encounter (Signed)
She can have Keflex 500mg  po qid x 7 days for the surgery would take first dose the day of surgery

## 2011-12-21 NOTE — Telephone Encounter (Signed)
Please advise refill? 

## 2011-12-21 NOTE — Telephone Encounter (Signed)
Left a message for the nurse that the medication was called into the pharmacy for patient to pick up

## 2012-01-03 ENCOUNTER — Ambulatory Visit: Payer: Managed Care, Other (non HMO)

## 2012-01-23 HISTORY — PX: EYE SURGERY: SHX253

## 2012-02-02 ENCOUNTER — Ambulatory Visit (INDEPENDENT_AMBULATORY_CARE_PROVIDER_SITE_OTHER): Payer: Managed Care, Other (non HMO) | Admitting: Family Medicine

## 2012-02-02 ENCOUNTER — Encounter: Payer: Self-pay | Admitting: Family Medicine

## 2012-02-02 VITALS — BP 108/74 | HR 58 | Temp 97.3°F | Ht 66.0 in | Wt 128.8 lb

## 2012-02-02 DIAGNOSIS — H6090 Unspecified otitis externa, unspecified ear: Secondary | ICD-10-CM

## 2012-02-02 DIAGNOSIS — H60399 Other infective otitis externa, unspecified ear: Secondary | ICD-10-CM

## 2012-02-02 MED ORDER — OFLOXACIN 0.3 % OT SOLN: 5.0000 [drp] | Freq: Two times a day (BID) | OTIC | Status: AC

## 2012-02-02 NOTE — Patient Instructions (Signed)
Otitis Externa  Otitis externa ("swimmer's ear") is a germ (bacterial) or fungal infection of the outer ear canal (from the eardrum to the outside of the ear). Swimming in dirty water may cause swimmer's ear. It also may be caused by moisture in the ear from water remaining after swimming or bathing. Often the first signs of infection may be itching in the ear canal. This may progress to ear canal swelling, redness, and pus drainage, which may be signs of infection.  HOME CARE INSTRUCTIONS    Apply the antibiotic drops to the ear canal as prescribed by your doctor.   This can be a very painful medical condition. A strong pain reliever may be prescribed.   Only take over-the-counter or prescription medicines for pain, discomfort, or fever as directed by your caregiver.   If your caregiver has given you a follow-up appointment, it is very important to keep that appointment. Not keeping the appointment could result in a chronic or permanent injury, pain, hearing loss and disability. If there is any problem keeping the appointment, you must call back to this facility for assistance.  PREVENTION    It is important to keep your ear dry. Use the corner of a towel to wick water out of the ear canal after swimming or bathing.   Avoid scratching in your ear. This can damage the ear canal or remove the protective wax lining the canal and make it easier for germs (bacteria) or a fungus to grow.   You may use ear drops made of rubbing alcohol and vinegar after swimming to prevent future "swimmer's ear" infections. Make up a small bottle of equal parts white vinegar and alcohol. Put 3 or 4 drops into each ear after swimming.   Avoid swimming in lakes, polluted water, or poorly chlorinated pools.  SEEK MEDICAL CARE IF:    An oral temperature above 102 F (38.9 C) develops.   Your ear is still painful after 3 days and shows signs of getting worse (redness, swelling, pain, or pus).  MAKE SURE YOU:    Understand these  instructions.   Will watch your condition.   Will get help right away if you are not doing well or get worse.  Document Released: 09/13/2005 Document Revised: 09/02/2011 Document Reviewed: 04/19/2008  [employer] Patient Information 2012 [employer].

## 2012-02-02 NOTE — Assessment & Plan Note (Addendum)
floxin otic 5 drops bid. On Keflex already due to eye tuck last week

## 2012-02-02 NOTE — Progress Notes (Signed)
Patient ID: Merrillyn Ackerley, female   DOB: Jan 16, 1961, 51 y.o.   MRN: [id number] Keeleigh Terris [id number] 1961-03-03 02/02/2012      Progress Note-Follow Up  Subjective  Chief Complaint  Chief Complaint  Patient presents with  . Otitis Media    right inner ear is swollen w/pain in jaw X 1 year off and on    HPI  Patient is the 51 yo caucasian female in today for evaluation of ear discomfort. She reports she has been having trouble off and on for 1 year. This episode has been ongoing for the past week. The ear canal feels swollen and tender. Hurts when she puts her ear buds in. She feels it is swollen anterior to her ear as well. She denies any HA, hearing loss, tinnitus, malaise, fevers, congestion, sore throat, cp, palp, sob, gi concerns  Past Medical History  Diagnosis Date  . Ulcer 1984    petic and duodenal  . IBS (irritable bowel syndrome)   . Migraines   . Hemorrhoids   . Blood transfusion   . 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 Neuropathy, sacral plexus 09/13/2011  . Osgood-Schlatter's disease 09/13/2011  . Endometriosis 09/13/2011  . Abdominal pain 09/13/2011  . Foot pain, right 09/13/2011  . Adenomatous colon polyp   . IBS (irritable bowel syndrome)   . ECG abnormal 11/19/2011    Past Surgical History  Procedure Date  . Laparoscopic hysterectomy 2000    total for extensive endometriosis  . Bunionectomy     bilateral  . Ankle reconstruction     left @ age 56  . Knee arthroscopy     bilateral  3 on left 2 on right  . Breast enhancement surgery   . Wrist reconstruction     left twice  . Foot surgery     left twice  . Tonsillectomy and adenoidectomy   . Laparoscopy     multiple times for endometriosis on all organs  . Abdominal hysterectomy   . 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 Metatarsal osteotomy 11/24/2011    Procedure: METATARSAL OSTEOTOMY;  Surgeon: Lenn Sink, DPM;  Location: [facility];  Service: Podiatry;  Laterality: Right;  RIGHT AIKENS OSTEOTOMY; RIGHT  METATARSAL OSTETOMY Second & Third WITH SCREW; Right Second arthroplasty lesser metatarsalphalangeal with total implant, FUSION Third TOE RIGHT  . Eye surgery 01/23/2012    cosmetic lift, eyelids, b/l    Family History  Problem Relation Age of Onset  . Alzheimer's disease Maternal Grandmother   . Heart attack Maternal Grandfather   . Leukemia Paternal Grandfather   . Liver disease Sister   . Cirrhosis Sister     primary biliary cirrhosis  . Heart disease Father     rheumatic heart disease    History   Social History  . Marital Status: Married    Spouse Name: N/A    Number of Children: N/A  . Years of Education: N/A   Occupational History  . Not on file.   Social History Main Topics  . Smoking status: Former Smoker -- 0.0 packs/day    Quit date: 11/16/2008  . Smokeless tobacco: Never Used  . Alcohol Use: 3.6 oz/week    6 Cans of beer per week  . Drug Use: Yes    Special: Marijuana     for pain  . Sexually Active: No   Other Topics Concern  . Not on file   Social History Narrative  . No narrative on file    Current Outpatient Prescriptions  on File Prior to Visit  Medication Sig Dispense Refill  . Omega-3 Fatty Acids (FISH OIL PO) Take 1 tablet by mouth daily.        Marland Kitchen VIVELLE-DOT 0.1 MG/24HR Place 1 patch onto the skin 2 (two) times a week.        Allergies  Allergen Reactions  . Nucynta (Tapentadol Hydrochloride) Itching    Review of Systems  Review of Systems  Constitutional: Negative for fever and malaise/fatigue.  HENT: Positive for ear pain. Negative for congestion.        Right, feels swollen and tender to touch x 1 week, has happened intermittently x 1 year  Eyes: Negative for discharge.  Respiratory: Negative for shortness of breath.   Cardiovascular: Negative for chest pain, palpitations and leg swelling.  Gastrointestinal: Negative for nausea, abdominal pain and diarrhea.  Genitourinary: Negative for dysuria.  Musculoskeletal: Negative for  falls.  Skin: Negative for rash.  Neurological: Negative for loss of consciousness and headaches.  Endo/Heme/Allergies: Negative for polydipsia.  Psychiatric/Behavioral: Negative for depression and suicidal ideas. The patient is not nervous/anxious and does not have insomnia.     Objective  BP 108/74  Pulse 58  Temp(Src) 97.3 F (36.3 C) (Temporal)  Ht 5\' 6"  (1.676 m)  Wt 128 lb 12.8 oz (58.423 kg)  BMI 20.79 kg/m2  SpO2 100%  Physical Exam  Physical Exam  Constitutional: She is oriented to person, place, and time and well-developed, well-nourished, and in no distress. No distress.  HENT:  Head: Normocephalic and atraumatic.       Right external canal, boggy and mildly erythematous, mild enlarged LN posterior and anterior auricular on right side. Left clear  Eyes: Conjunctivae are normal.  Neck: Neck supple. No thyromegaly present.  Cardiovascular: Normal rate, regular rhythm and normal heart sounds.   No murmur heard. Pulmonary/Chest: Effort normal and breath sounds normal. She has no wheezes.  Abdominal: She exhibits no distension and no mass.  Musculoskeletal: She exhibits no edema.  Lymphadenopathy:    She has no cervical adenopathy.  Neurological: She is alert and oriented to person, place, and time.  Skin: Skin is warm and dry. No rash noted. She is not diaphoretic.  Psychiatric: Memory, affect and judgment normal.    No results found for this basename: TSH   Lab Results  Component Value Date   WBC 6.5 11/17/2011   HGB 13.0 11/24/2011   HCT 41.0 11/17/2011   MCV 93.8 11/17/2011   PLT 250.0 11/17/2011   Lab Results  Component Value Date   CREATININE 0.8 11/17/2011   BUN 13 11/17/2011   NA 137 11/17/2011   K 4.6 11/17/2011   CL 100 11/17/2011   CO2 30 11/17/2011   Lab Results  Component Value Date   ALT 20 11/17/2011   AST 21 11/17/2011   ALKPHOS 50 11/17/2011   BILITOT 1.2 11/17/2011     Assessment & Plan  Otitis externa floxin otic 5 drops bid. On Keflex  already due to eye tuck last week

## 2012-02-07 ENCOUNTER — Ambulatory Visit: Payer: Managed Care, Other (non HMO) | Admitting: Family Medicine

## 2012-02-14 ENCOUNTER — Other Ambulatory Visit: Payer: Self-pay | Admitting: Family Medicine

## 2012-02-14 ENCOUNTER — Ambulatory Visit
Admission: RE | Admit: 2012-02-14 | Discharge: 2012-02-14 | Disposition: A | Discharge status: Home / Self Care | Payer: Managed Care, Other (non HMO) | Source: Ambulatory Visit | Attending: Obstetrics & Gynecology | Admitting: Obstetrics & Gynecology

## 2012-02-14 DIAGNOSIS — Z1231 Encounter for screening mammogram for malignant neoplasm of breast: Secondary | ICD-10-CM

## 2012-02-17 ENCOUNTER — Ambulatory Visit
Admission: RE | Admit: 2012-02-17 | Discharge: 2012-02-17 | Disposition: A | Discharge status: Home / Self Care | Payer: Managed Care, Other (non HMO) | Source: Ambulatory Visit | Attending: Family Medicine | Admitting: Family Medicine

## 2012-02-17 ENCOUNTER — Other Ambulatory Visit: Payer: Self-pay | Admitting: Family Medicine

## 2012-02-17 DIAGNOSIS — R928 Other abnormal and inconclusive findings on diagnostic imaging of breast: Secondary | ICD-10-CM

## 2012-04-17 ENCOUNTER — Telehealth (INDEPENDENT_AMBULATORY_CARE_PROVIDER_SITE_OTHER): Payer: Self-pay

## 2012-04-17 NOTE — Telephone Encounter (Signed)
Pt has appt with Dr Luisa Hart next Tuesday. Dr Odis Luster would like pt seen asap to rule out a hernia. Pt wants and abd scar revision by Dr. Odis Luster in the near future but needs to rule out hernia. I advised Dr Maxcine Ham that Dr Luisa Hart is DOW this week and Next Tuesdays  appt will be the soonest Dr Luisa Hart back in office. He states he understands and this will be ok. If Dr Luisa Hart has any concerns he can be reached at  [phone].

## 2012-04-25 ENCOUNTER — Ambulatory Visit (INDEPENDENT_AMBULATORY_CARE_PROVIDER_SITE_OTHER): Payer: PRIVATE HEALTH INSURANCE | Admitting: Surgery

## 2012-04-25 ENCOUNTER — Encounter (INDEPENDENT_AMBULATORY_CARE_PROVIDER_SITE_OTHER): Payer: Self-pay | Admitting: Surgery

## 2012-04-25 VITALS — BP 112/70 | HR 56 | Temp 98.2°F | Resp 16 | Ht 66.0 in | Wt 130.4 lb

## 2012-04-25 DIAGNOSIS — M6208 Separation of muscle (nontraumatic), other site: Secondary | ICD-10-CM

## 2012-04-25 DIAGNOSIS — M6200 Separation of muscle (nontraumatic), unspecified site: Secondary | ICD-10-CM

## 2012-04-25 NOTE — Progress Notes (Signed)
Subjective:     Patient ID: Annette Snow, female   DOB: 1960-12-09, 51 y.o.   MRN: [id number]  HPI Patient returns today for followup of her midline abdominal wound. 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 She is doing well and is seeing Dr. Odis Luster from plastic surgery consideration of scar revision. She an area that was weak and felt to be a hernia and she is here today to have it checked. She had a bulge and some chronic abdominal pain it went away a couple weeks ago after she bathed her dog. There was a bulge there but has gone away according to the patient.  Review of Systems  Cardiovascular: Negative.   Gastrointestinal: Positive for abdominal pain.  Musculoskeletal: Negative.        Objective:   Physical Exam  Constitutional: She appears well-developed and well-nourished.  Eyes: EOM are normal. Pupils are equal, round, and reactive to light.  Neck: Normal range of motion. Neck supple.  Abdominal:         Assessment:     Diastasis recti    Plan:     Very thin midline fascia.  I cannot reproduce a hernia today but she is certainly at risk.  Will discuss with Dr Odis Luster and see what his plans are. I can help if needed.  May benefit from re .aproximation of rectus muscles but I cannot feel a hernia defect today

## 2012-04-25 NOTE — Patient Instructions (Signed)
Will discuss with Dr Odis Luster

## 2012-06-23 ENCOUNTER — Ambulatory Visit (INDEPENDENT_AMBULATORY_CARE_PROVIDER_SITE_OTHER): Payer: PRIVATE HEALTH INSURANCE | Admitting: Family Medicine

## 2012-06-23 ENCOUNTER — Ambulatory Visit (HOSPITAL_BASED_OUTPATIENT_CLINIC_OR_DEPARTMENT_OTHER)
Admission: RE | Admit: 2012-06-23 | Discharge: 2012-06-23 | Disposition: A | Discharge status: Home / Self Care | Payer: No Typology Code available for payment source | Source: Ambulatory Visit | Attending: Family Medicine | Admitting: Family Medicine

## 2012-06-23 ENCOUNTER — Encounter: Payer: Self-pay | Admitting: Family Medicine

## 2012-06-23 VITALS — BP 104/68 | HR 74 | Temp 98.7°F | Ht 66.0 in | Wt 129.8 lb

## 2012-06-23 DIAGNOSIS — Z23 Encounter for immunization: Secondary | ICD-10-CM

## 2012-06-23 DIAGNOSIS — R109 Unspecified abdominal pain: Secondary | ICD-10-CM

## 2012-06-23 DIAGNOSIS — M25519 Pain in unspecified shoulder: Secondary | ICD-10-CM

## 2012-06-23 DIAGNOSIS — R1012 Left upper quadrant pain: Secondary | ICD-10-CM

## 2012-06-23 NOTE — Patient Instructions (Signed)
Shoulder Pain The shoulder is a ball and socket joint. The muscles and tendons (rotator cuff) are what keep the shoulder in its joint and stable. This collection of muscles and tendons holds in the head (ball) of the humerus (upper arm bone) in the fossa (cup) of the scapula (shoulder blade). Today no reason was found for your shoulder pain. Often pain in the shoulder may be treated conservatively with temporary immobilization. For example, holding the shoulder in one place using a sling for rest. Physical therapy may be needed if problems continue. HOME CARE INSTRUCTIONS   Apply ice to the sore area for 15 to 20 minutes, 3 to 4 times per day for the first 2 days. Put the ice in a plastic bag. Place a towel between the bag of ice and your skin.   If you have or were given a shoulder sling and straps, do not remove for as long as directed by your caregiver or until you see a caregiver for a follow-up examination. If you need to remove it to shower or bathe, move your arm as little as possible.   Sleep on several pillows at night to lessen swelling and pain.   Only take over-the-counter or prescription medicines for pain, discomfort, or fever as directed by your caregiver.   Keep any follow-up appointments in order to avoid any type of permanent shoulder disability or chronic pain problems.  SEEK MEDICAL CARE IF:   Pain in your shoulder increases or new pain develops in your arm, hand, or fingers.   Your hand or fingers are colder than your other hand.   You do not obtain pain relief with the medications or your pain becomes worse.  SEEK IMMEDIATE MEDICAL CARE IF:   Your arm, hand, or fingers are numb or tingling.   Your arm, hand, or fingers are swollen, painful, or turn white or blue.   You develop chest pain or shortness of breath.  MAKE SURE YOU:   Understand these instructions.   Will watch your condition.   Will get help right away if you are not doing well or get worse.    Document Released: 06/23/2005 Document Revised: 09/02/2011 Document Reviewed: 08/28/2011 [employer] Patient Information 2012 [employer]. 

## 2012-06-23 NOTE — Progress Notes (Signed)
Quick Note:  Patient Informed and voiced understanding ______ 

## 2012-06-25 ENCOUNTER — Encounter (HOSPITAL_COMMUNITY): Payer: Self-pay | Admitting: *Deleted

## 2012-06-25 ENCOUNTER — Telehealth (INDEPENDENT_AMBULATORY_CARE_PROVIDER_SITE_OTHER): Payer: Self-pay | Admitting: General Surgery

## 2012-06-25 ENCOUNTER — Emergency Department (HOSPITAL_COMMUNITY)
Admission: EM | Admit: 2012-06-25 | Discharge: 2012-06-25 | Disposition: A | Discharge status: Home / Self Care | Payer: No Typology Code available for payment source | Attending: Emergency Medicine | Admitting: Emergency Medicine

## 2012-06-25 ENCOUNTER — Emergency Department (HOSPITAL_COMMUNITY): Payer: No Typology Code available for payment source

## 2012-06-25 DIAGNOSIS — R109 Unspecified abdominal pain: Secondary | ICD-10-CM | POA: Insufficient documentation

## 2012-06-25 DIAGNOSIS — R110 Nausea: Secondary | ICD-10-CM | POA: Insufficient documentation

## 2012-06-25 DIAGNOSIS — Z79899 Other long term (current) drug therapy: Secondary | ICD-10-CM | POA: Insufficient documentation

## 2012-06-25 LAB — CBC WITH DIFFERENTIAL/PLATELET
Eosinophils Absolute: 0.3 10*3/uL (ref 0.0–0.7)
Eosinophils Relative: 4 % (ref 0–5)
HCT: 42 % (ref 36.0–46.0)
Hemoglobin: 14.7 g/dL (ref 12.0–15.0)
Lymphocytes Relative: 43 % (ref 12–46)
Lymphs Abs: 3.4 10*3/uL (ref 0.7–4.0)
MCH: 32.4 pg (ref 26.0–34.0)
MCV: 92.5 fL (ref 78.0–100.0)
Monocytes Relative: 11 % (ref 3–12)
RBC: 4.54 MIL/uL (ref 3.87–5.11)
WBC: 7.7 10*3/uL (ref 4.0–10.5)

## 2012-06-25 LAB — COMPREHENSIVE METABOLIC PANEL
ALT: 18 U/L (ref 0–35)
AST: 20 U/L (ref 0–37)
Albumin: 3.7 g/dL (ref 3.5–5.2)
CO2: 28 mEq/L (ref 19–32)
Calcium: 9.2 mg/dL (ref 8.4–10.5)
Creatinine, Ser: 0.79 mg/dL (ref 0.50–1.10)
Sodium: 135 mEq/L (ref 135–145)
Total Protein: 6.6 g/dL (ref 6.0–8.3)

## 2012-06-25 MED ORDER — MORPHINE SULFATE 4 MG/ML IJ SOLN
4.0000 mg | Freq: Once | INTRAMUSCULAR | Status: AC
  Administered 2012-06-25: 4 mg via INTRAVENOUS
  Filled 2012-06-25: qty 1

## 2012-06-25 MED ORDER — ONDANSETRON HCL 4 MG/2ML IJ SOLN
4.0000 mg | Freq: Once | INTRAMUSCULAR | Status: AC
  Administered 2012-06-25: 4 mg via INTRAVENOUS
  Filled 2012-06-25: qty 2

## 2012-06-25 MED ORDER — HYDROMORPHONE HCL 4 MG PO TABS: 4.0000 mg | ORAL_TABLET | ORAL | Status: DC | PRN

## 2012-06-25 MED ORDER — IOHEXOL 300 MG/ML  SOLN
100.0000 mL | Freq: Once | INTRAMUSCULAR | Status: AC | PRN
  Administered 2012-06-25: 100 mL via INTRAVENOUS

## 2012-06-25 MED ORDER — SODIUM CHLORIDE 0.9 % IV SOLN
INTRAVENOUS | Status: DC
  Administered 2012-06-25: 13:00:00 via INTRAVENOUS

## 2012-06-25 MED ORDER — HYOSCYAMINE SULFATE CR 0.375 MG PO CP12: 0.3750 mg | ORAL_CAPSULE | Freq: Two times a day (BID) | ORAL | Status: DC | PRN

## 2012-06-25 NOTE — Consult Note (Signed)
Chief Complaint  Patient presents with  . Post-op Problem  . Abdominal Pain    HISTORY:  Annette Snow is a 51 y.o. female who presents to ED with severe subxyphoid pain and bruising.  She states that this began Thurs night with a "ripping" sensation.  Since then, she has had pain with any movement of her abd wall.  She has mild nausea but no vomiting.    Past Medical History  Diagnosis Date  . Ulcer 1984    petic and duodenal  . IBS (irritable bowel syndrome)   . Migraines   . Hemorrhoids   . Blood transfusion   . 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 Neuropathy, sacral plexus 09/13/2011  . Osgood-Schlatter's disease 09/13/2011  . Endometriosis 09/13/2011  . Abdominal pain 09/13/2011  . Foot pain, right 09/13/2011  . Adenomatous colon polyp   . IBS (irritable bowel syndrome)   . ECG abnormal 11/19/2011       Past Surgical History  Procedure Date  . Laparoscopic hysterectomy 2000    total for extensive endometriosis  . Bunionectomy     bilateral  . Ankle reconstruction     left @ age 67  . Knee arthroscopy     bilateral  3 on left 2 on right  . Breast enhancement surgery   . Wrist reconstruction     left twice  . Foot surgery     left twice  . Tonsillectomy and adenoidectomy   . Laparoscopy     multiple times for endometriosis on all organs  . Abdominal hysterectomy   . 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 Metatarsal osteotomy 11/24/2011    Procedure: METATARSAL OSTEOTOMY;  Surgeon: Lenn Sink, DPM;  Location: [facility];  Service: Podiatry;  Laterality: Right;  RIGHT AIKENS OSTEOTOMY; RIGHT METATARSAL OSTETOMY Second & Third WITH SCREW; Right Second arthroplasty lesser metatarsalphalangeal with total implant, FUSION Third TOE RIGHT  . Eye surgery 01/23/2012    cosmetic lift, eyelids, b/l      Current Facility-Administered Medications  Medication Dose Route Frequency Provider Last Rate Last Dose  . 0.9 %  sodium chloride infusion   Intravenous Continuous Cheri Guppy, MD      . morphine 4 MG/ML injection 4 mg  4 mg Intravenous Once Cheri Guppy, MD      . ondansetron [facility]) injection 4 mg  4 mg Intravenous Once Cheri Guppy, MD       Current Outpatient Prescriptions  Medication Sig Dispense Refill  . co-enzyme Q-10 30 MG capsule Take 30 mg by mouth daily.      Marland Kitchen HYDROCODONE-ACETAMINOPHEN PO Take 1 tablet by mouth every 6 (six) hours as needed. For pain.      Marland Kitchen omega-3 acid ethyl esters (LOVAZA) 1 G capsule Take 1 g by mouth daily.      Marland Kitchen PROMETHAZINE HCL PO Take 1 tablet by mouth every 6 (six) hours as needed. For nausea.      Marland Kitchen VIVELLE-DOT 0.1 MG/24HR Place 1 patch onto the skin every 3 (three) days.       . promethazine (PHENERGAN) 12.5 MG tablet Take 1 tablet (12.5 mg total) by mouth every 6 (six) hours as needed for nausea.  30 tablet  0     Allergies  Allergen Reactions  . Nucynta (Tapentadol Hydrochloride) Itching      Family History  Problem Relation Age of Onset  . Alzheimer's disease Maternal Grandmother   . Heart attack Maternal Grandfather   . Leukemia Paternal Grandfather   .  Liver disease Sister   . Cirrhosis Sister     primary biliary cirrhosis  . Heart disease Father     rheumatic heart disease      History   Social History  . Marital Status: Married    Spouse Name: N/A    Number of Children: N/A  . Years of Education: N/A   Social History Main Topics  . Smoking status: Former Smoker -- 0.0 packs/day    Quit date: 11/16/2008  . Smokeless tobacco: Never Used  . Alcohol Use: 3.6 oz/week    6 Cans of beer per week  . Drug Use: Yes    Special: Marijuana     for pain  . Sexually Active: No   Other Topics Concern  . None   Social History Narrative  . None       REVIEW OF SYSTEMS - PERTINENT POSITIVES ONLY: Review of Systems - Negative except what's stated in HPI  EXAM: Filed Vitals:   06/25/12 1124  BP: 109/74  Pulse: 65  Temp: 98.2 F (36.8 C)  Resp: 16    General appearance:  alert, cooperative and mild distress Resp: clear to auscultation bilaterally Cardio: regular rate and rhythm GI: soft, nondistended, bruisng noted at superior portion of previous scar., tender to palpate No incarcerated hernias  LABORATORY RESULTS: Available labs are reviewed  pending  RADIOLOGY RESULTS:   Images and reports are reviewed. CT pending  ASSESSMENT AND PLAN: She appears to have more musculoskeletal pain related to her scar and xyphoid bone.  I suspect she tore some scar tissue and it is no inflamed.  We will get a CT scan to rule out any further internal injury.  If this is neg, she will be ok for discharge back to home.    Vanita Panda, MD Colon and Rectal Surgery / General Surgery [facility]      Visit Diagnoses: No diagnosis found.  Primary Care Physician: Danise Edge, MD

## 2012-06-25 NOTE — ED Provider Notes (Addendum)
History     CSN: [id number]  Arrival date & time 06/25/12  1059   First MD Initiated Contact with Patient 06/25/12 1156      Chief Complaint  Patient presents with  . Post-op Problem  . Abdominal Pain    (Consider location/radiation/quality/duration/timing/severity/associated sxs/prior treatment) Patient is a 51 y.o. female presenting with abdominal pain. The history is provided by the patient.  Abdominal Pain The primary symptoms of the illness include abdominal pain and nausea. The primary symptoms of the illness do not include fever, shortness of breath, vomiting, diarrhea or dysuria.  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 1 y ago.  Recently developed severe epigastric abd pain that feels as if she were "tearing" inside.   Also nauseated. No vomiting. No cp or sob.  No fever.  Seen by her pcp 2 d ago. Had neg [place].   Pain increased so she came to ed.   Past Medical History  Diagnosis Date  . Ulcer 1984    petic and duodenal  . IBS (irritable bowel syndrome)   . Migraines   . Hemorrhoids   . Blood transfusion   . 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 Neuropathy, sacral plexus 09/13/2011  . Osgood-Schlatter's disease 09/13/2011  . Endometriosis 09/13/2011  . Abdominal pain 09/13/2011  . Foot pain, right 09/13/2011  . Adenomatous colon polyp   . IBS (irritable bowel syndrome)   . ECG abnormal 11/19/2011    Past Surgical History  Procedure Date  . Laparoscopic hysterectomy 2000    total for extensive endometriosis  . Bunionectomy     bilateral  . Ankle reconstruction     left @ age 35  . Knee arthroscopy     bilateral  3 on left 2 on right  . Breast enhancement surgery   . Wrist reconstruction     left twice  . Foot surgery     left twice  . Tonsillectomy and adenoidectomy   . Laparoscopy     multiple times for endometriosis on all organs  . Abdominal hysterectomy   . 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 Metatarsal osteotomy 11/24/2011   Procedure: METATARSAL OSTEOTOMY;  Surgeon: Lenn Sink, DPM;  Location: [facility];  Service: Podiatry;  Laterality: Right;  RIGHT AIKENS OSTEOTOMY; RIGHT METATARSAL OSTETOMY Second & Third WITH SCREW; Right Second arthroplasty lesser metatarsalphalangeal with total implant, FUSION Third TOE RIGHT  . Eye surgery 01/23/2012    cosmetic lift, eyelids, b/l    Family History  Problem Relation Age of Onset  . Alzheimer's disease Maternal Grandmother   . Heart attack Maternal Grandfather   . Leukemia Paternal Grandfather   . Liver disease Sister   . Cirrhosis Sister     primary biliary cirrhosis  . Heart disease Father     rheumatic heart disease    History  Substance Use Topics  . Smoking status: Former Smoker -- 0.0 packs/day    Quit date: 11/16/2008  . Smokeless tobacco: Never Used  . Alcohol Use: 3.6 oz/week    6 Cans of beer per week    OB History    Grav Para Term Preterm Abortions TAB SAB Ect Mult Living                  Review of Systems  Constitutional: Negative for fever.  Respiratory: Negative for shortness of breath.   Cardiovascular: Negative for chest pain.  Gastrointestinal: Positive for nausea and abdominal pain. Negative for vomiting and diarrhea.  Genitourinary: Negative for dysuria.  All other systems reviewed and are negative.    Allergies  Nucynta  Home Medications   Current Outpatient Rx  Name Route Sig Dispense Refill  . COENZYME Q10 30 MG PO CAPS Oral Take 30 mg by mouth daily.    Marland Kitchen HYDROCODONE-ACETAMINOPHEN PO Oral Take 1 tablet by mouth every 6 (six) hours as needed. For pain.    Marland Kitchen OMEGA-3-ACID ETHYL ESTERS 1 G PO CAPS Oral Take 1 g by mouth daily.    Marland Kitchen PROMETHAZINE HCL PO Oral Take 1 tablet by mouth every 6 (six) hours as needed. For nausea.    Marland Kitchen VIVELLE-DOT 0.1 MG/24HR TD PTTW Transdermal Place 1 patch onto the skin every 3 (three) days.     Marland Kitchen PROMETHAZINE HCL 12.5 MG PO TABS Oral Take 1 tablet (12.5 mg total) by mouth  every 6 (six) hours as needed for nausea. 30 tablet 0    BP 109/74  Pulse 65  Temp 98.2 F (36.8 C) (Oral)  Resp 16  SpO2 100%  Physical Exam  Nursing note and vitals reviewed. Constitutional: She is oriented to person, place, and time. She appears well-developed and well-nourished.  HENT:  Head: Normocephalic and atraumatic.  Eyes: Conjunctivae normal are normal.  Neck: Normal range of motion. Neck supple.  Cardiovascular: Normal rate, regular rhythm and intact distal pulses.   No murmur heard. Pulmonary/Chest: Effort normal and breath sounds normal. No respiratory distress.  Abdominal: Bowel sounds are normal. She exhibits no distension. There is tenderness. There is guarding. There is no rebound.       Midline abd scar with epigastric ttp and guarding  Musculoskeletal: Normal range of motion.  Neurological: She is alert and oriented to person, place, and time.  Skin: Skin is warm and dry.  Psychiatric: She has a normal mood and affect. Thought content normal.    ED Course  Procedures (including critical care time)   Labs Reviewed  COMPREHENSIVE METABOLIC PANEL  LIPASE, BLOOD  CBC WITH DIFFERENTIAL   No results found.   No diagnosis found.  3:08 PM Pain still controlled No acute abdomen.  MDM  abd pain        Cheri Guppy, MD 06/25/12 1215  Cheri Guppy, MD 06/25/12 [phone]

## 2012-06-25 NOTE — Telephone Encounter (Signed)
Got a call from her today.  She is currently being treated for possible incisional hernia.  She complains of pain at top of her scar with bulge and discoloration.  This has been getting worse since Friday night.  Complains of nausea and headache as well.  I instructed her to come to the ED immediately for evaluation and CT scan.

## 2012-06-25 NOTE — Assessment & Plan Note (Signed)
LUQ  Pain for several days, abdominal ultrasound unremarkable, encouraged to use hysocyamine to use prn. Seek immediate care if symptoms worsen or bowels stop moving

## 2012-06-25 NOTE — ED Notes (Addendum)
Patient reports had emergency splenectomy a year ago.  Pt MD Cornett concerned area has herniated. Pt was seen by PCP Friday morning for [place] which was negative. Pt reports she has felt the pain started Wednesday, worse especially with movement and lifting starting Saturday. Pt also concerned area is discolored at top of scar area.Pt concerned area is splitting open.  Patient reports pain is 7/10.   Patient also talked with surgeon and was encouraged to come to ER for CT.

## 2012-06-25 NOTE — Progress Notes (Signed)
Patient ID: Annette Snow, female   DOB: [birth year], 51 y.o.   MRN: [id number] Annette Snow [id number] September 19, 1961 06/25/2012      Progress Note-Follow Up  Subjective  Chief Complaint  Chief Complaint  Patient presents with  . Abdominal Pain    left side under breasts X 2 weeks  . Shoulder Pain    left shoulder X 2 weeks  . Injections    flu    HPI  Patient is a 51 year old Caucasian female in today for evaluation of abdominal pain. She says she had a ripping feeling in the left upper quadrant and has been in pain since then that same general area. Struggling with nausea and anorexia. The pain is intense but the cost vomiting on occasion. No constipation, blood in stool. No fevers, chills, heartburn, HA  Past Medical History  Diagnosis Date  . Ulcer 1984    petic and duodenal  . IBS (irritable bowel syndrome)   . Migraines   . Hemorrhoids   . Blood transfusion   . 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 Neuropathy, sacral plexus 09/13/2011  . Osgood-Schlatter's disease 09/13/2011  . Endometriosis 09/13/2011  . Abdominal pain 09/13/2011  . Foot pain, right 09/13/2011  . Adenomatous colon polyp   . IBS (irritable bowel syndrome)   . ECG abnormal 11/19/2011    Past Surgical History  Procedure Date  . Laparoscopic hysterectomy 2000    total for extensive endometriosis  . Bunionectomy     bilateral  . Ankle reconstruction     left @ age 26  . Knee arthroscopy     bilateral  3 on left 2 on right  . Breast enhancement surgery   . Wrist reconstruction     left twice  . Foot surgery     left twice  . Tonsillectomy and adenoidectomy   . Laparoscopy     multiple times for endometriosis on all organs  . Abdominal hysterectomy   . 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 Metatarsal osteotomy 11/24/2011    Procedure: METATARSAL OSTEOTOMY;  Surgeon: Lenn Sink, DPM;  Location: [facility];  Service: Podiatry;  Laterality: Right;  RIGHT AIKENS OSTEOTOMY; RIGHT METATARSAL OSTETOMY  Second & Third WITH SCREW; Right Second arthroplasty lesser metatarsalphalangeal with total implant, FUSION Third TOE RIGHT  . Eye surgery 01/23/2012    cosmetic lift, eyelids, b/l    Family History  Problem Relation Age of Onset  . Alzheimer's disease Maternal Grandmother   . Heart attack Maternal Grandfather   . Leukemia Paternal Grandfather   . Liver disease Sister   . Cirrhosis Sister     primary biliary cirrhosis  . Heart disease Father     rheumatic heart disease    History   Social History  . Marital Status: Married    Spouse Name: N/A    Number of Children: N/A  . Years of Education: N/A   Occupational History  . Not on file.   Social History Main Topics  . Smoking status: Former Smoker -- 0.0 packs/day    Quit date: 11/16/2008  . Smokeless tobacco: Never Used  . Alcohol Use: 3.6 oz/week    6 Cans of beer per week  . Drug Use: Yes    Special: Marijuana     for pain  . Sexually Active: No   Other Topics Concern  . Not on file   Social History Narrative  . No narrative on file    Current Outpatient Prescriptions on File Prior to  Visit  Medication Sig Dispense Refill  . VIVELLE-DOT 0.1 MG/24HR Place 1 patch onto the skin every 3 (three) days.       Marland Kitchen omega-3 acid ethyl esters (LOVAZA) 1 G capsule Take 1 g by mouth daily.      . promethazine (PHENERGAN) 12.5 MG tablet Take 1 tablet (12.5 mg total) by mouth every 6 (six) hours as needed for nausea.  30 tablet  0   No current facility-administered medications on file prior to visit.    Allergies  Allergen Reactions  . Nucynta (Tapentadol Hydrochloride) Itching    Review of Systems  Review of Systems  Constitutional: Negative for fever and malaise/fatigue.  HENT: Negative for congestion.   Eyes: Negative for discharge.  Respiratory: Negative for shortness of breath.   Cardiovascular: Negative for chest pain, palpitations and leg swelling.  Gastrointestinal: Positive for nausea, vomiting and  abdominal pain. Negative for heartburn, diarrhea, constipation and blood in stool.  Genitourinary: Negative for dysuria.  Musculoskeletal: Negative for falls.  Skin: Negative for rash.  Neurological: Negative for loss of consciousness and headaches.  Endo/Heme/Allergies: Negative for polydipsia.  Psychiatric/Behavioral: Negative for depression and suicidal ideas. The patient is nervous/anxious. The patient does not have insomnia.     Objective  BP 104/68  Pulse 74  Temp 98.7 F (37.1 C) (Temporal)  Ht 5\' 6"  (1.676 m)  Wt 129 lb 12.8 oz (58.877 kg)  BMI 20.95 kg/m2  SpO2 97%  Physical Exam  Physical Exam  Constitutional: She is oriented to person, place, and time and well-developed, well-nourished, and in no distress. No distress.  HENT:  Head: Normocephalic and atraumatic.  Eyes: Conjunctivae normal are normal.  Neck: Neck supple. No thyromegaly present.  Cardiovascular: Normal rate, regular rhythm and normal heart sounds.   No murmur heard. Pulmonary/Chest: Effort normal and breath sounds normal. She has no wheezes.  Abdominal: Bowel sounds are normal. She exhibits no distension and no mass. There is tenderness. There is no rebound and no guarding.       Significant scar tissue across abdomen  Musculoskeletal: She exhibits no edema.  Lymphadenopathy:    She has no cervical adenopathy.  Neurological: She is alert and oriented to person, place, and time.  Skin: Skin is warm and dry. No rash noted. She is not diaphoretic.  Psychiatric: Memory, affect and judgment normal.    No results found for this basename: TSH   Lab Results  Component Value Date   WBC 7.7 06/25/2012   HGB 14.7 06/25/2012   HCT 42.0 06/25/2012   MCV 92.5 06/25/2012   PLT 299 06/25/2012   Lab Results  Component Value Date   CREATININE 0.79 06/25/2012   BUN 6 06/25/2012   NA 135 06/25/2012   K 3.7 06/25/2012   CL 99 06/25/2012   CO2 28 06/25/2012   Lab Results  Component Value Date   ALT 18 06/25/2012     AST 20 06/25/2012   ALKPHOS 53 06/25/2012   BILITOT 0.5 06/25/2012     Assessment & Plan  Abdominal pain LUQ  Pain for several days, abdominal ultrasound unremarkable, encouraged to use hysocyamine to use prn. Seek immediate care if symptoms worsen or bowels stop moving

## 2012-06-25 NOTE — ED Notes (Signed)
Scar noted to center of patient abdomen.  Small indented area noted to center of scar. Pt also has small cut to top of scar tissue area. No blue noted to scar area.

## 2012-07-06 ENCOUNTER — Other Ambulatory Visit: Payer: Self-pay | Admitting: Family Medicine

## 2012-07-06 ENCOUNTER — Telehealth: Payer: Self-pay | Admitting: Family Medicine

## 2012-07-06 DIAGNOSIS — N63 Unspecified lump in unspecified breast: Secondary | ICD-10-CM

## 2012-07-06 NOTE — Telephone Encounter (Signed)
Patient found a lump in her left breast yesterday in a self exam. [facility] [place] [facility].

## 2012-07-06 NOTE — Telephone Encounter (Signed)
Confirmed with [facility] that patient can schedule her own appt. Contacted patient & gave her phone # to schedule her appointment.

## 2012-07-06 NOTE — Telephone Encounter (Signed)
done

## 2012-07-12 ENCOUNTER — Ambulatory Visit
Admission: RE | Admit: 2012-07-12 | Discharge: 2012-07-12 | Disposition: A | Discharge status: Home / Self Care | Payer: PRIVATE HEALTH INSURANCE | Source: Ambulatory Visit | Attending: Family Medicine | Admitting: Family Medicine

## 2012-07-12 DIAGNOSIS — N63 Unspecified lump in unspecified breast: Secondary | ICD-10-CM

## 2012-08-03 ENCOUNTER — Ambulatory Visit (HOSPITAL_COMMUNITY)
Admission: RE | Admit: 2012-08-03 | Discharge: 2012-08-03 | Disposition: A | Discharge status: Home / Self Care | Payer: No Typology Code available for payment source | Source: Ambulatory Visit | Attending: Plastic Surgery | Admitting: Plastic Surgery

## 2012-08-03 ENCOUNTER — Other Ambulatory Visit (HOSPITAL_COMMUNITY): Payer: Self-pay | Admitting: Plastic Surgery

## 2012-08-03 ENCOUNTER — Encounter (HOSPITAL_COMMUNITY): Payer: Self-pay

## 2012-08-03 ENCOUNTER — Other Ambulatory Visit: Payer: Self-pay | Admitting: Radiology

## 2012-08-03 ENCOUNTER — Ambulatory Visit (HOSPITAL_COMMUNITY): Payer: No Typology Code available for payment source

## 2012-08-03 DIAGNOSIS — S301XXA Contusion of abdominal wall, initial encounter: Secondary | ICD-10-CM

## 2012-08-03 DIAGNOSIS — IMO0002 Reserved for concepts with insufficient information to code with codable children: Secondary | ICD-10-CM | POA: Insufficient documentation

## 2012-08-03 DIAGNOSIS — Y849 Medical procedure, unspecified as the cause of abnormal reaction of the patient, or of later complication, without mention of misadventure at the time of the procedure: Secondary | ICD-10-CM | POA: Insufficient documentation

## 2012-08-03 LAB — CBC
Hemoglobin: 14 g/dL (ref 12.0–15.0)
MCH: 32.3 pg (ref 26.0–34.0)
MCHC: 35.3 g/dL (ref 30.0–36.0)
RDW: 12.7 % (ref 11.5–15.5)

## 2012-08-03 LAB — PROTIME-INR: Prothrombin Time: 13.2 seconds (ref 11.6–15.2)

## 2012-08-03 MED ORDER — MIDAZOLAM HCL 2 MG/2ML IJ SOLN
INTRAMUSCULAR | Status: AC | PRN
  Administered 2012-08-03: 1 mg via INTRAVENOUS

## 2012-08-03 MED ORDER — FENTANYL CITRATE 0.05 MG/ML IJ SOLN
INTRAMUSCULAR | Status: AC | PRN
  Administered 2012-08-03: 50 ug via INTRAVENOUS

## 2012-08-03 MED ORDER — MIDAZOLAM HCL 2 MG/2ML IJ SOLN
INTRAMUSCULAR | Status: AC
  Filled 2012-08-03: qty 4

## 2012-08-03 MED ORDER — FENTANYL CITRATE 0.05 MG/ML IJ SOLN
INTRAMUSCULAR | Status: AC
  Filled 2012-08-03: qty 4

## 2012-08-03 MED ORDER — SODIUM CHLORIDE 0.9 % IV SOLN
Freq: Once | INTRAVENOUS | Status: AC
  Administered 2012-08-03: 250 mL via INTRAVENOUS

## 2012-08-03 MED ORDER — SODIUM CHLORIDE 0.9 % IV SOLN
INTRAVENOUS | Status: AC | PRN
  Administered 2012-08-03: 50 mL/h via INTRAVENOUS

## 2012-08-03 NOTE — Procedures (Signed)
US abdominal wall seroma drain insertion 35cc blood tinged thin fluid aspirated No comp Stable Full report in PACS

## 2012-08-03 NOTE — H&P (Signed)
Annette Snow is an 51 y.o. female.   Chief Complaint: Abd fluid collection Post colonoscopy - rupture - splenic damage - splenectomy 1 yr ago On going abd fluid collection Scheduled for abd fluid collection aspiration vs drain placement HPI: IBS; migraines; endometriosis; splenic damage - splenectomy  Past Medical History  Diagnosis Date  . Ulcer 1984    petic and duodenal  . IBS (irritable bowel syndrome)   . Migraines   . Hemorrhoids   . Blood transfusion   . 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 Neuropathy, sacral plexus 09/13/2011  . Osgood-Schlatter's disease 09/13/2011  . Endometriosis 09/13/2011  . Abdominal pain 09/13/2011  . Foot pain, right 09/13/2011  . Adenomatous colon polyp   . IBS (irritable bowel syndrome)   . ECG abnormal 11/19/2011    Past Surgical History  Procedure Date  . Laparoscopic hysterectomy 2000    total for extensive endometriosis  . Bunionectomy     bilateral  . Ankle reconstruction     left @ age 61  . Knee arthroscopy     bilateral  3 on left 2 on right  . Breast enhancement surgery   . Wrist reconstruction     left twice  . Foot surgery     left twice  . Tonsillectomy and adenoidectomy   . Laparoscopy     multiple times for endometriosis on all organs  . Abdominal hysterectomy   . 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 Metatarsal osteotomy 11/24/2011    Procedure: METATARSAL OSTEOTOMY;  Surgeon: Lenn Sink, DPM;  Location: [facility];  Service: Podiatry;  Laterality: Right;  RIGHT AIKENS OSTEOTOMY; RIGHT METATARSAL OSTETOMY Second & Third WITH SCREW; Right Second arthroplasty lesser metatarsalphalangeal with total implant, FUSION Third TOE RIGHT  . Eye surgery 01/23/2012    cosmetic lift, eyelids, b/l    Family History  Problem Relation Age of Onset  . Alzheimer's disease Maternal Grandmother   . Heart attack Maternal Grandfather   . Leukemia Paternal Grandfather   . Liver disease Sister   . Cirrhosis Sister     primary biliary  cirrhosis  . Heart disease Father     rheumatic heart disease   Social History:  reports that she quit smoking about 3 years ago. She has never used smokeless tobacco. She reports that she drinks about 3.6 ounces of alcohol per week. She reports that she uses illicit drugs (Marijuana).  Allergies:  Allergies  Allergen Reactions  . Nucynta (Tapentadol Hydrochloride) Itching     (Not in a hospital admission)  Results for orders placed during the hospital encounter of 08/03/12 (from the past 48 hour(s))  CBC     Status: Normal   Collection Time   08/03/12 11:25 AM      Component Value Range Comment   WBC 8.3  4.0 - 10.5 K/uL    RBC 4.33  3.87 - 5.11 MIL/uL    Hemoglobin 14.0  12.0 - 15.0 g/dL    HCT 16.1  09.6 - 04.5 %    MCV 91.7  78.0 - 100.0 fL    MCH 32.3  26.0 - 34.0 pg    MCHC 35.3  30.0 - 36.0 g/dL    RDW 40.9  81.1 - 91.4 %    Platelets 368  150 - 400 K/uL    No results found.  Review of Systems  Constitutional: Negative for fever.  HENT: Positive for neck pain.   Respiratory: Negative for shortness of breath.   Cardiovascular: Negative for  chest pain.  Gastrointestinal: Positive for abdominal pain.  Neurological: Negative for weakness.    There were no vitals taken for this visit. Physical Exam  Constitutional: She is oriented to person, place, and time. She appears well-nourished.  Cardiovascular: Normal rate, regular rhythm and normal heart sounds.   No murmur heard. Respiratory: Effort normal and breath sounds normal. She has no wheezes.  GI: Soft. Bowel sounds are normal. There is tenderness.  Musculoskeletal: Normal range of motion.  Neurological: She is alert and oriented to person, place, and time.  Psychiatric: She has a normal mood and affect. Her behavior is normal. Judgment and thought content normal.     Assessment/Plan Abd fluid collection with pain; continual sxs x 1 yr Post colonoscopy - spleen damage - splenectomy Scheduled for abd fluid  collection asp vs drain Pt aware of procedure benefits and risks and agreeable to proceed. Consent signed and in chart  Krysteena Stalker A 08/03/2012, 12:15 PM

## 2012-08-03 NOTE — Progress Notes (Signed)
Pt shown how to change drg, flush cath, recharge bulb.  Supplies given for pt to take home. Pt and boyfriend verbalize understanding.

## 2012-08-14 ENCOUNTER — Other Ambulatory Visit: Payer: Self-pay | Admitting: Physician Assistant

## 2012-08-14 ENCOUNTER — Encounter (HOSPITAL_COMMUNITY): Payer: Self-pay | Admitting: Respiratory Therapy

## 2012-08-14 ENCOUNTER — Other Ambulatory Visit (HOSPITAL_COMMUNITY): Payer: Self-pay | Admitting: Plastic Surgery

## 2012-08-14 DIAGNOSIS — IMO0002 Reserved for concepts with insufficient information to code with codable children: Secondary | ICD-10-CM

## 2012-08-15 ENCOUNTER — Ambulatory Visit (HOSPITAL_COMMUNITY)
Admission: RE | Admit: 2012-08-15 | Discharge: 2012-08-15 | Disposition: A | Discharge status: Home / Self Care | Payer: No Typology Code available for payment source | Source: Ambulatory Visit | Attending: Plastic Surgery | Admitting: Plastic Surgery

## 2012-08-15 VITALS — BP 101/69 | HR 99 | Temp 97.5°F | Resp 18 | Ht 66.0 in | Wt 127.0 lb

## 2012-08-15 DIAGNOSIS — Y839 Surgical procedure, unspecified as the cause of abnormal reaction of the patient, or of later complication, without mention of misadventure at the time of the procedure: Secondary | ICD-10-CM | POA: Insufficient documentation

## 2012-08-15 DIAGNOSIS — IMO0002 Reserved for concepts with insufficient information to code with codable children: Secondary | ICD-10-CM | POA: Insufficient documentation

## 2012-08-15 DIAGNOSIS — Z01812 Encounter for preprocedural laboratory examination: Secondary | ICD-10-CM | POA: Insufficient documentation

## 2012-08-15 DIAGNOSIS — N809 Endometriosis, unspecified: Secondary | ICD-10-CM | POA: Insufficient documentation

## 2012-08-15 DIAGNOSIS — K589 Irritable bowel syndrome without diarrhea: Secondary | ICD-10-CM | POA: Insufficient documentation

## 2012-08-15 DIAGNOSIS — G43909 Migraine, unspecified, not intractable, without status migrainosus: Secondary | ICD-10-CM | POA: Insufficient documentation

## 2012-08-15 DIAGNOSIS — M928 Other specified juvenile osteochondrosis: Secondary | ICD-10-CM | POA: Insufficient documentation

## 2012-08-15 LAB — APTT: aPTT: 30 seconds (ref 24–37)

## 2012-08-15 LAB — CBC
MCV: 91.9 fL (ref 78.0–100.0)
Platelets: 315 10*3/uL (ref 150–400)
RBC: 4.44 MIL/uL (ref 3.87–5.11)
RDW: 12.5 % (ref 11.5–15.5)
WBC: 6.7 10*3/uL (ref 4.0–10.5)

## 2012-08-15 LAB — PROTIME-INR: Prothrombin Time: 13.7 seconds (ref 11.6–15.2)

## 2012-08-15 MED ORDER — SODIUM CHLORIDE 0.9 % IV SOLN
INTRAVENOUS | Status: DC
  Administered 2012-08-15: 07:00:00 via INTRAVENOUS

## 2012-08-15 MED ORDER — FENTANYL CITRATE 0.05 MG/ML IJ SOLN
INTRAMUSCULAR | Status: AC | PRN
  Administered 2012-08-15: 50 ug via INTRAVENOUS

## 2012-08-15 MED ORDER — MIDAZOLAM HCL 2 MG/2ML IJ SOLN
INTRAMUSCULAR | Status: AC | PRN
  Administered 2012-08-15: 1 mg via INTRAVENOUS

## 2012-08-15 MED ORDER — MIDAZOLAM HCL 2 MG/2ML IJ SOLN
INTRAMUSCULAR | Status: AC
  Filled 2012-08-15: qty 4

## 2012-08-15 MED ORDER — FENTANYL CITRATE 0.05 MG/ML IJ SOLN
INTRAMUSCULAR | Status: AC
  Filled 2012-08-15: qty 4

## 2012-08-15 NOTE — H&P (Signed)
Annette Snow is an 51 y.o. female.   Chief Complaint: recurrent seroma post recent drain removal.  HPI:   Post colonoscopy - rupture - splenic damage - splenectomy 1 yr ago  On going abd fluid collection  Scheduled for abd fluid collection aspiration vs drain placement , IBS; migraines; endometriosis; splenic damage - splenectomy   Past Medical History  Diagnosis Date  . Ulcer 1984    petic and duodenal  . IBS (irritable bowel syndrome)   . Migraines   . Hemorrhoids   . Blood transfusion   . 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 Neuropathy, sacral plexus 09/13/2011  . Osgood-Schlatter's disease 09/13/2011  . Endometriosis 09/13/2011  . Abdominal pain 09/13/2011  . Foot pain, right 09/13/2011  . Adenomatous colon polyp   . IBS (irritable bowel syndrome)   . ECG abnormal 11/19/2011    Past Surgical History  Procedure Date  . Laparoscopic hysterectomy 2000    total for extensive endometriosis  . Bunionectomy     bilateral  . Ankle reconstruction     left @ age 30  . Knee arthroscopy     bilateral  3 on left 2 on right  . Breast enhancement surgery   . Wrist reconstruction     left twice  . Foot surgery     left twice  . Tonsillectomy and adenoidectomy   . Laparoscopy     multiple times for endometriosis on all organs  . Abdominal hysterectomy   . 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 Metatarsal osteotomy 11/24/2011    Procedure: METATARSAL OSTEOTOMY;  Surgeon: Lenn Sink, DPM;  Location: [facility];  Service: Podiatry;  Laterality: Right;  RIGHT AIKENS OSTEOTOMY; RIGHT METATARSAL OSTETOMY Second & Third WITH SCREW; Right Second arthroplasty lesser metatarsalphalangeal with total implant, FUSION Third TOE RIGHT  . Eye surgery 01/23/2012    cosmetic lift, eyelids, b/l    Family History  Problem Relation Age of Onset  . Alzheimer's disease Maternal Grandmother   . Heart attack Maternal Grandfather   . Leukemia Paternal Grandfather   . Liver disease Sister   . Cirrhosis  Sister     primary biliary cirrhosis  . Heart disease Father     rheumatic heart disease   Social History:  reports that she quit smoking about 3 years ago. She has never used smokeless tobacco. She reports that she drinks about 3.6 ounces of alcohol per week. She reports that she uses illicit drugs (Marijuana).  Allergies:  Allergies  Allergen Reactions  . Nucynta (Tapentadol Hydrochloride) Itching     Results for orders placed during the hospital encounter of 08/15/12 (from the past 48 hour(s))  APTT     Status: Normal   Collection Time   08/15/12  6:58 AM      Component Value Range Comment   aPTT 30  24 - 37 seconds   CBC     Status: Normal   Collection Time   08/15/12  6:58 AM      Component Value Range Comment   WBC 6.7  4.0 - 10.5 K/uL    RBC 4.44  3.87 - 5.11 MIL/uL    Hemoglobin 13.9  12.0 - 15.0 g/dL    HCT 29.5  62.1 - 30.8 %    MCV 91.9  78.0 - 100.0 fL    MCH 31.3  26.0 - 34.0 pg    MCHC 34.1  30.0 - 36.0 g/dL    RDW 65.7  84.6 - 96.2 %  Platelets 315  150 - 400 K/uL   PROTIME-INR     Status: Normal   Collection Time   08/15/12  6:58 AM      Component Value Range Comment   Prothrombin Time 13.7  11.6 - 15.2 seconds    INR 1.06  0.00 - 1.49     Review of Systems  Constitutional: Negative for fever, chills and weight loss.  Eyes: Negative.   Respiratory: Negative.   Cardiovascular: Negative.   Gastrointestinal: Positive for nausea and abdominal pain.  Musculoskeletal: Positive for joint pain.  Skin: Negative.   Neurological: Negative.   Endo/Heme/Allergies: Negative.     Blood pressure 104/68, pulse 76, temperature 97.5 F (36.4 C), temperature source Oral, resp. rate 18, height 5\' 6"  (1.676 m), weight 127 lb (57.607 kg), SpO2 97.00%. Physical Exam  Constitutional: She is oriented to person, place, and time. She appears well-developed and well-nourished. No distress.  HENT:  Head: Normocephalic and atraumatic.  Cardiovascular: Normal rate and  normal heart sounds.  Exam reveals no gallop and no friction rub.   No murmur heard. Respiratory: Effort normal and breath sounds normal. No respiratory distress. She has no wheezes. She has no rales.  GI: Soft. Bowel sounds are normal. She exhibits no distension and no mass. There is tenderness. There is no guarding.  Musculoskeletal: Normal range of motion. She exhibits no edema.  Neurological: She is alert and oriented to person, place, and time.  Skin: Skin is warm and dry.  Psychiatric: She has a normal mood and affect. Her behavior is normal. Judgment and thought content normal.     Assessment/Plan Procedure details for aspiration and possible replacement of percutaneous drain discussed in detail with patient. She is familiar with procedure. Apparently drain was only putting out 10 ml daily upon removal by MD.  Now has recurrent on one side of her surgical scar.  Patient's questions answered to her satisfaction. Written consent obtained.   CAMPBELL,PAMELA D 08/15/2012, 8:17 AM

## 2012-08-15 NOTE — Procedures (Signed)
Technically successful US guided placement of 8 Fr drain into recurrent symptomatic seroma within the SQ tissues of the anterior wall of the abd.  Approximately 25 cc of serous, slightly brown fluid aspirated. No immediate complications.

## 2012-08-15 NOTE — ED Notes (Signed)
Family updated as to patient's status.

## 2012-08-15 NOTE — ED Notes (Signed)
Patient is resting comfortably. 

## 2012-08-15 NOTE — ED Notes (Signed)
Pt placed on 2L O2 

## 2012-08-16 ENCOUNTER — Telehealth (HOSPITAL_COMMUNITY): Payer: Self-pay | Admitting: *Deleted

## 2012-08-16 NOTE — Telephone Encounter (Signed)
Post procedure radiology phone call.  Message left on identified answering machine.  Encouraged to call for problems or questions.

## 2012-12-15 IMAGING — US US ABDOMEN COMPLETE
1 series · 14 of 25 positions shown · non-contrast
Comparison: CT scan 07/01/2011

CLINICAL DATA: Abdominal pain, right upper quadrant pain,
epigastric

COMPLETE ABDOMINAL ULTRASOUND

[Series 1: us abdomen complete · 0.24mm/px · 14 of 82 slices shown]
[im 1/82]
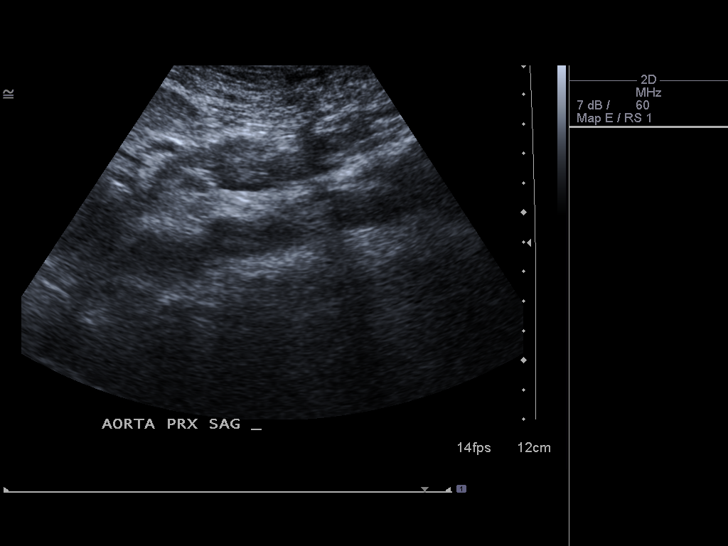
[im 7/82]
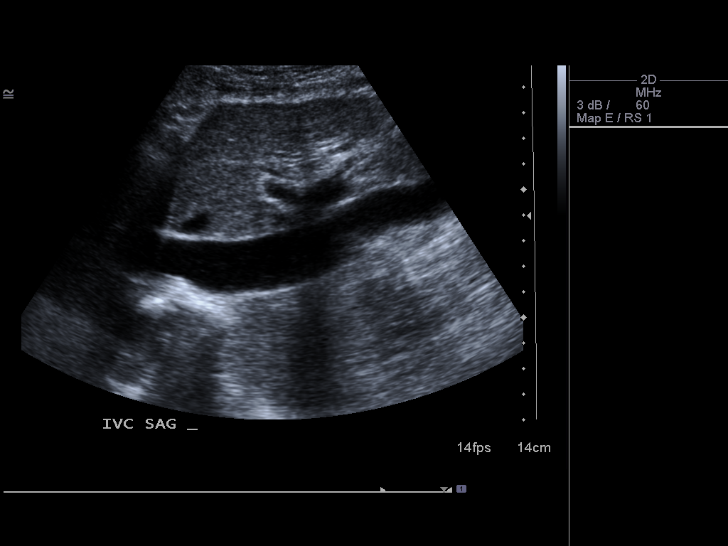
[im 14/82]
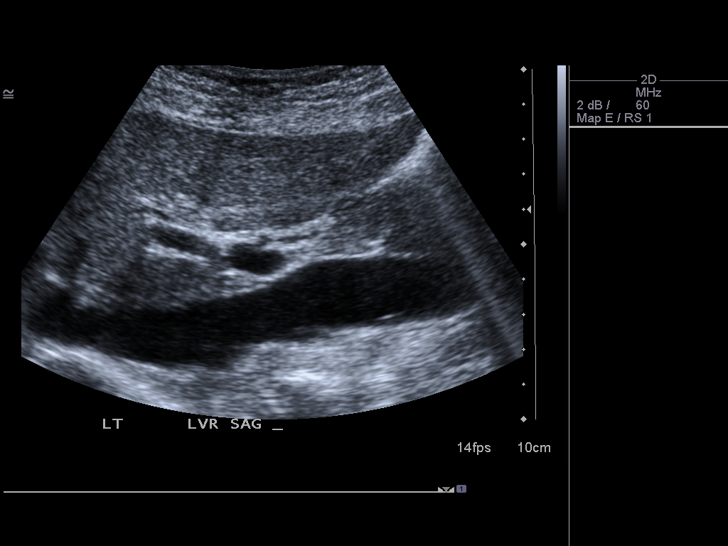
[im 21/82]
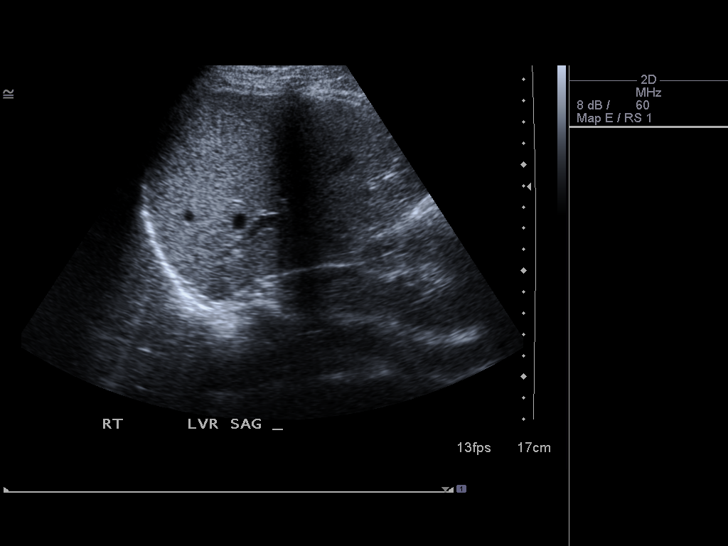
[im 28/82]
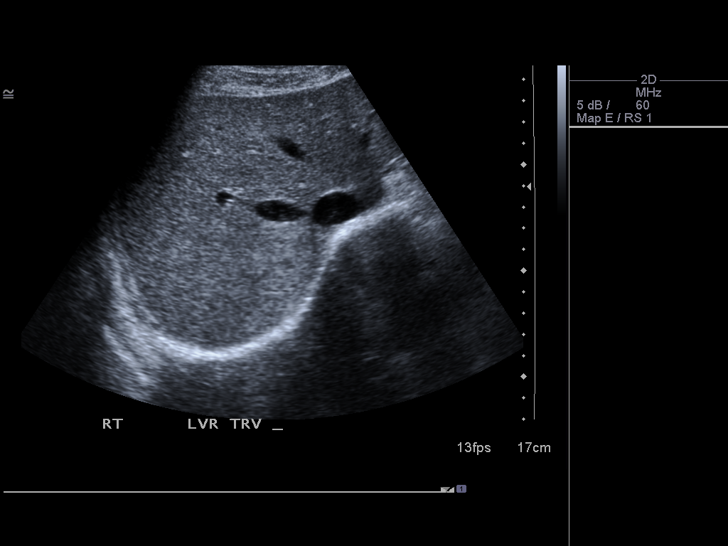
[im 31/82]
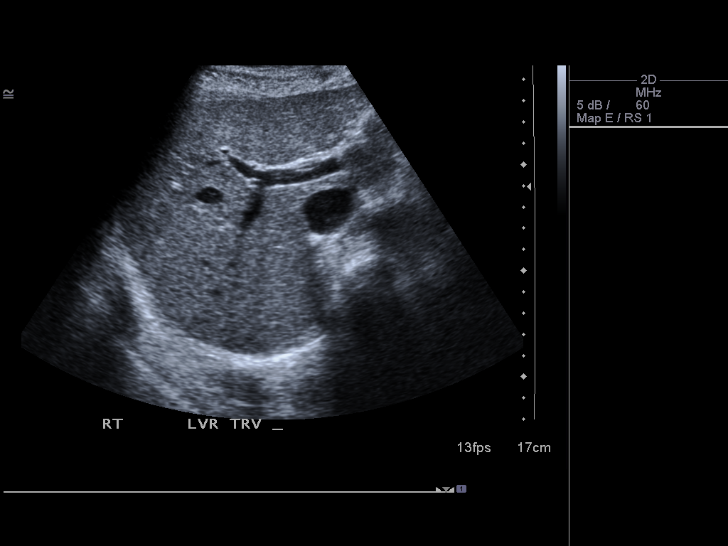
[im 38/82]
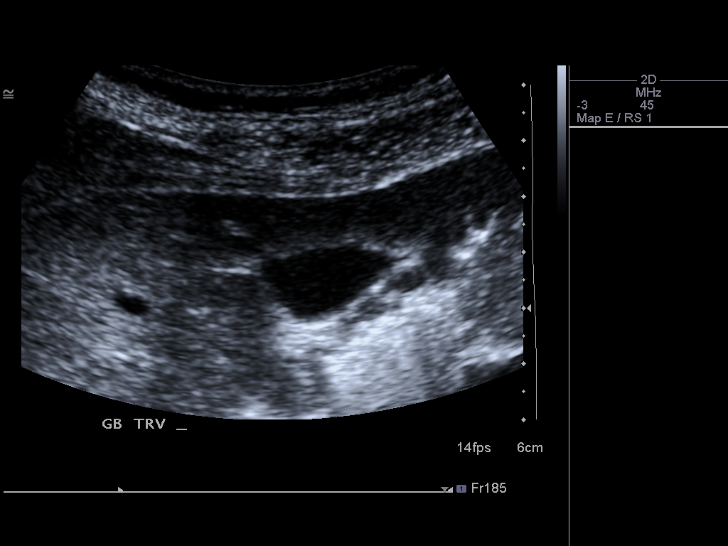
[im 44/82]
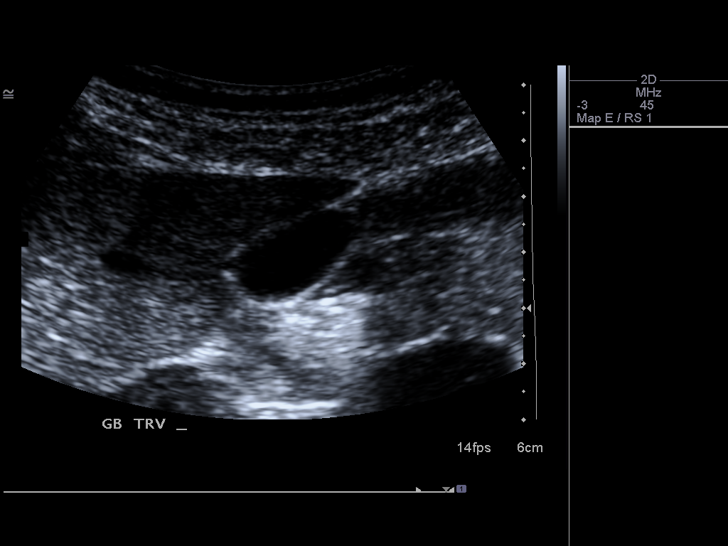
[im 51/82]
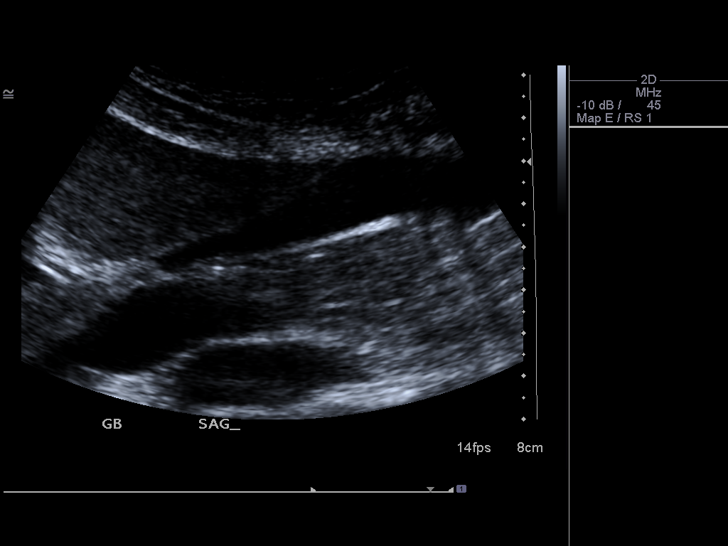
[im 55/82]
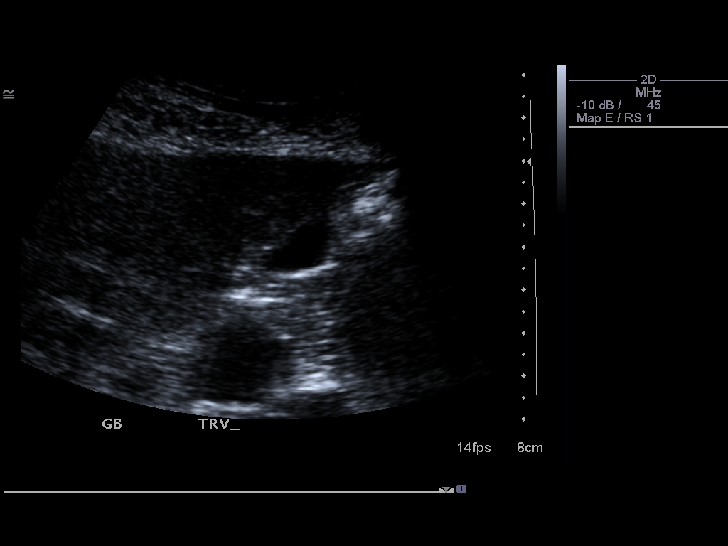
[im 61/82]
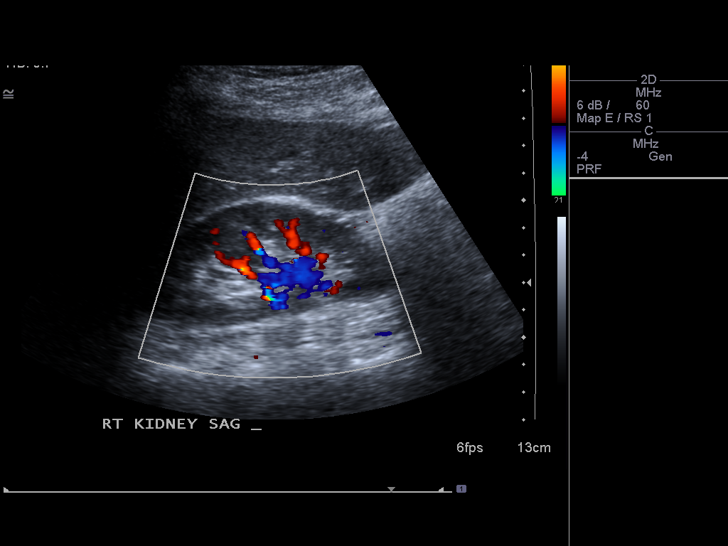
[im 68/82]
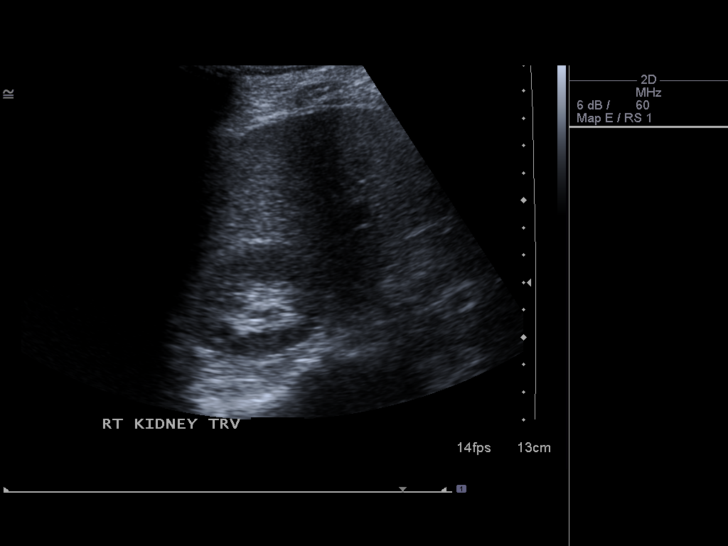
[im 75/82]
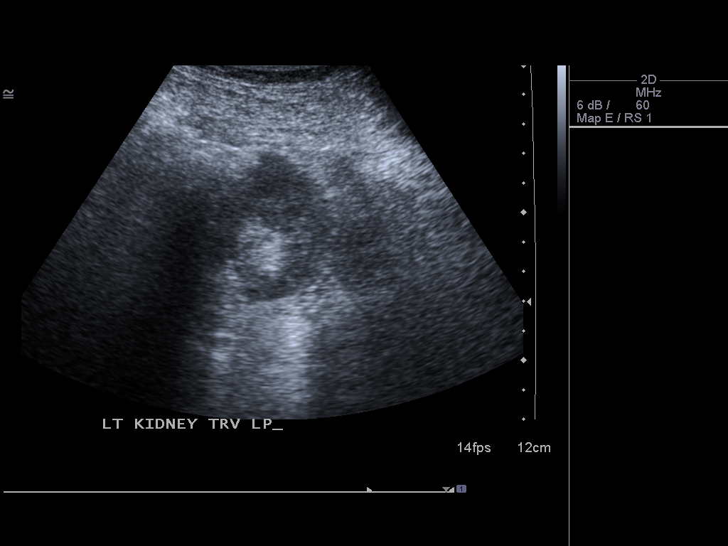
[im 82/82]
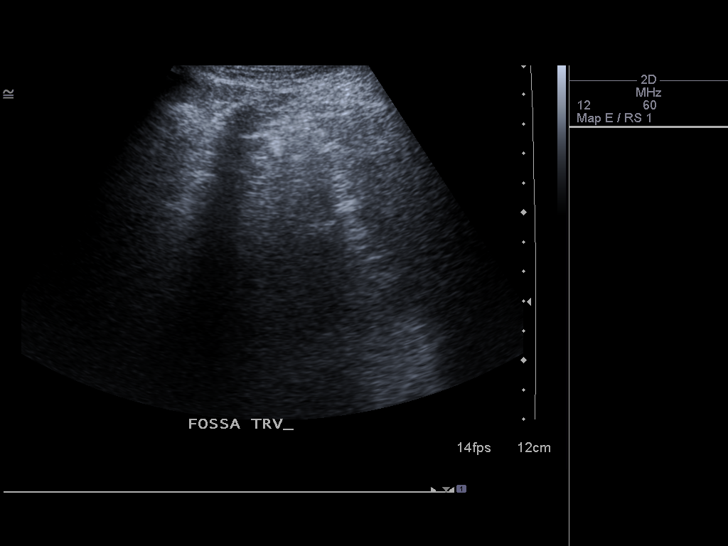

[14 of 25 positions shown; findings below may reference images not displayed]

FINDINGS: Gallbladder:  No gallstones, gallbladder wall thickening, or
pericholecystic fluid. No sonographic Murphy's sign.

Common bile duct:  Measures 1.2 mm in diameter within normal
limits.

Liver:  No focal lesion identified.  Within normal limits in
parenchymal echogenicity.

IVC: Limited visualization due to bowel gas.

Pancreas:  Limited visualization due to bowel gas.

Spleen:  Surgically absent

Right Kidney:  Measures 10.2 cm in length.  No mass, hydronephrosis
or diagnostic renal calculus.

Left Kidney:  Measures 9.3 cm in length.  No mass, hydronephrosis
or diagnostic renal calculus

Abdominal aorta:  No aneurysm identified. There is limited
visualization of the aorta due to abundant bowel gas.  Aorta
measures up to 1.6 cm in diameter.
IMPRESSION: 1.  No gallstones are noted within gallbladder.  No sonographic
Murphy's sign.  Normal CBD.
2.  Surgically absent spleen.
3.  No hydronephrosis or diagnostic renal calculus.  Suboptimal
exam due to abundant bowel gas.

## 2013-01-23 ENCOUNTER — Ambulatory Visit (INDEPENDENT_AMBULATORY_CARE_PROVIDER_SITE_OTHER): Payer: 59 | Admitting: Family Medicine

## 2013-01-23 ENCOUNTER — Encounter: Payer: Self-pay | Admitting: Family Medicine

## 2013-01-23 VITALS — Ht 66.0 in

## 2013-01-23 DIAGNOSIS — R220 Localized swelling, mass and lump, head: Secondary | ICD-10-CM

## 2013-01-23 DIAGNOSIS — R221 Localized swelling, mass and lump, neck: Secondary | ICD-10-CM

## 2013-01-24 ENCOUNTER — Encounter: Payer: Self-pay | Admitting: Family Medicine

## 2013-01-24 ENCOUNTER — Ambulatory Visit (INDEPENDENT_AMBULATORY_CARE_PROVIDER_SITE_OTHER): Payer: 59 | Admitting: Family Medicine

## 2013-01-24 ENCOUNTER — Other Ambulatory Visit: Payer: Self-pay | Admitting: Family Medicine

## 2013-01-24 VITALS — BP 106/70 | HR 69 | Temp 98.1°F | Ht 66.0 in | Wt 130.0 lb

## 2013-01-24 DIAGNOSIS — Z0000 Encounter for general adult medical examination without abnormal findings: Secondary | ICD-10-CM

## 2013-01-24 DIAGNOSIS — R220 Localized swelling, mass and lump, head: Secondary | ICD-10-CM

## 2013-01-24 DIAGNOSIS — Q8901 Asplenia (congenital): Secondary | ICD-10-CM

## 2013-01-24 DIAGNOSIS — R1013 Epigastric pain: Secondary | ICD-10-CM

## 2013-01-24 DIAGNOSIS — R221 Localized swelling, mass and lump, neck: Secondary | ICD-10-CM | POA: Insufficient documentation

## 2013-01-24 DIAGNOSIS — T148 Other injury of unspecified body region: Secondary | ICD-10-CM

## 2013-01-24 DIAGNOSIS — Q8909 Congenital malformations of spleen: Secondary | ICD-10-CM

## 2013-01-24 DIAGNOSIS — W57XXXA Bitten or stung by nonvenomous insect and other nonvenomous arthropods, initial encounter: Secondary | ICD-10-CM

## 2013-01-24 LAB — RENAL FUNCTION PANEL
Albumin: 4.1 g/dL (ref 3.5–5.2)
BUN: 11 mg/dL (ref 6–23)
Chloride: 103 mEq/L (ref 96–112)
GFR: 89.71 mL/min (ref >60.00)
Glucose, Bld: 64 mg/dL — ABNORMAL LOW (ref 70–99)
Phosphorus: 2.9 mg/dL (ref 2.3–4.6)
Potassium: 4.4 mEq/L (ref 3.5–5.1)

## 2013-01-24 LAB — CBC
HCT: 40.3 % (ref 36.0–46.0)
Hemoglobin: 13.5 g/dL (ref 12.0–15.0)
MCHC: 33.6 g/dL (ref 30.0–36.0)
RDW: 13.5 % (ref 11.5–14.6)
WBC: 5.7 10*3/uL (ref 4.5–10.5)

## 2013-01-24 LAB — HEPATIC FUNCTION PANEL
Albumin: 4.1 g/dL (ref 3.5–5.2)
Total Protein: 6.5 g/dL (ref 6.0–8.3)

## 2013-01-24 LAB — LIPID PANEL
HDL: 98.1 mg/dL (ref >39.00)
Triglycerides: 50 mg/dL (ref 0.0–149.0)
VLDL: 10 mg/dL (ref 0.0–40.0)

## 2013-01-24 LAB — TSH: TSH: 1.13 u[IU]/mL (ref 0.35–5.50)

## 2013-01-24 NOTE — Patient Instructions (Addendum)
Call in next couple of weeks if pain, fevers, symptoms worsen or any concerns If lesion persists call for referral to surgery  Wood Tick Bite Ticks are insects that attach themselves to the skin. Most tick bites are harmless, but sometimes ticks carry diseases that can make a person quite ill. The chance of getting ill depends on:  The kind of tick that bites you.  Time of year.  How long the tick is attached.  Geographic location. Wood ticks are also called dog ticks. They are generally black. They can have white markings. They live in shrubs and grassy areas. They are larger than deer ticks. Wood ticks are about the size of a watermelon seed. They have a hard body. The most common places for ticks to attach themselves are the scalp, neck, armpits, waist, and groin. Wood ticks may stay attached for up to 2 weeks. TICKS MUST BE REMOVED AS SOON AS POSSIBLE TO HELP PREVENT DISEASES CAUSED BY TICK BITES.  TO REMOVE A TICK: 1. If available, put on latex gloves before trying to remove a tick. 2. Grasp the tick as close to the skin as possible, with curved forceps, fine tweezers or a special tick removal tool. 3. Pull gently with steady pressure until the tick lets go. Do not twist the tick or jerk it suddenly. This may break off the tick's head or mouth parts. 4. Do not crush the tick's body. This could force disease-carrying fluids from the tick into your body. 5. After the tick is removed, wash the bite area and your hands with soap and water or other disinfectant. 6. Apply a small amount of antiseptic cream or ointment to the bite site. 7. Wash and disinfect any instruments that were used. 8. Save the tick in a jar or plastic bag for later identification. Preserve the tick with a bit of alcohol or put it in the freezer. 9. Do not apply a hot match, petroleum jelly, or fingernail polish to the tick. This does not work and may increase the chances of disease from the tick bite. YOU MAY NEED TO  SEE YOUR CAREGIVER FOR A TETANUS SHOT NOW IF:  You have no idea when you had the last one.  You have never had a tetanus shot before. If you need a tetanus shot, and you decide not to get one, there is a rare chance of getting tetanus. Sickness from tetanus can be serious. If you get a tetanus shot, your arm may swell, get red and warm to the touch at the shot site. This is common and not a problem. PREVENTION  Wear protective clothing. Long sleeves and pants are best.  Wear white clothes to see ticks more easily  Tuck your pant legs into your socks.  If walking on trail, stay in the middle of the trail to avoid brushing against bushes.  Put insect repellent on all exposed skin and along boot tops, pant legs and sleeve cuffs  Check clothing, hair and skin repeatedly and before coming inside.  Brush off any ticks that are not attached. SEEK MEDICAL CARE IF:   You cannot remove a tick or part of the tick that is left in the skin.  Unexplained fever.  Redness and swelling in the area of the tick bite.  Tender, swollen lymph glands.  Diarrhea.  Weight loss.  Cough.  Fatigue.  Muscle, joint or bone pain.  Belly pain.  Headache.  Rash. SEEK IMMEDIATE MEDICAL CARE IF:   You develop an  oral temperature above 102 F (38.9 C).  You are having trouble walking or moving your legs.  Numbness in the legs.  Shortness of breath.  Confusion.  Repeated vomiting. Document Released: 09/10/2000 Document Revised: 12/06/2011 Document Reviewed: 08/19/2008 [facility] Patient Information 2013 [place], [place]. Deer Tick Bite Deer ticks are brown arachnids (spider family) that vary in size from as small as the head of a pin to 1/4 inch (1/2 cm) diameter. They thrive in wooded areas. Deer are the preferred host of adult deer ticks. Small rodents are the host of young ticks (nymphs). When a person walks in a field or wooded area, young and adult ticks in the surrounding grass and  vegetation can attach themselves to the skin. They can suck blood for hours to days if unnoticed. Ticks are found all over the [place] Some ticks carry a specific bacteria (Borrelia burgdorferi) that causes an infection called Lyme disease. The bacteria is typically passed into a person during the blood sucking process. This happens after the tick has been attached for at least a number of hours. While ticks can be found all over the [place] those carrying the bacteria that causes Lyme disease are most common in [place] and the [place]. Only a small proportion of ticks in these areas carry the Lyme disease bacteria and cause human infections. Ticks usually attach to warm spots on the body, such as the:  Head.  Back.  Neck.  Armpits.  Groin. SYMPTOMS  Most of the time, a deer tick bite will not be felt. You may or may not see the attached tick. You may notice mild irritation or redness around the bite site. If the deer tick passes the Lyme disease bacteria to a person, a round, red rash may be noticed 2 to 3 days after the bite. The rash may be clear in the middle, like a bull's-eye or target. If not treated, other symptoms may develop several days to weeks after the onset of the rash. These symptoms may include:  New rash lesions.  Fatigue and weakness.  General ill feeling and achiness.  Chills.  Headache and neck pain.  Swollen lymph glands.  Sore muscles and joints. 5 to 15% of untreated people with Lyme disease may develop more severe illnesses after several weeks to months. This may include inflammation of the brain lining (meningitis), nerve palsies, an abnormal heartbeat, or severe muscle and joint pain and inflammation (myositis or arthritis). DIAGNOSIS   Physical exam and medical history.  Viewing the tick if it was saved for confirmation.  Blood tests (to check or confirm the presence of Lyme disease). TREATMENT  Most ticks do not carry disease. If found, an attached  tick should be removed using tweezers. Tweezers should be placed under the body of the tick so it is removed by its attachment parts (pincers). If there are signs or symptoms of being sick, or Lyme disease is confirmed, medicines (antibiotics) that kill germs are usually prescribed. In more severe cases, antibiotics may be given through an intravenous (IV) access. HOME CARE INSTRUCTIONS   Always remove ticks with tweezers. Do not use petroleum jelly or other methods to kill or remove the tick. Slide the tweezers under the body and pull out as much as you can. If you are not sure what it is, save it in a jar and show your caregiver.  Once you remove the tick, the skin will heal on its own. Wash your hands and the affected area with water and  soap. You may place a bandage on the affected area.  Take medicine as directed. You may be advised to take a full course of antibiotics.  Follow up with your caregiver as recommended. FINDING OUT THE RESULTS OF YOUR TEST Not all test results are available during your visit. If your test results are not back during the visit, make an appointment with your caregiver to find out the results. Do not assume everything is normal if you have not heard from your caregiver or the medical facility. It is important for you to follow up on all of your test results. PROGNOSIS  If Lyme disease is confirmed, early treatment with antibiotics is very effective. Following preventive guidelines is important since it is possible to get the disease more than once. PREVENTION   Wear long sleeves and long pants in wooded or grassy areas. Tuck your pants into your socks.  Use an insect repellent while hiking.  Check yourself, your children, and your pets regularly for ticks after playing outside.  Clear piles of leaves or brush from your yard. Ticks might live there. SEEK MEDICAL CARE IF:   You or your child has an oral temperature above 102 F (38.9 C).  You develop a  severe headache following the bite.  You feel generally ill.  You notice a rash.  You are having trouble removing the tick.  The bite area has red skin or yellow drainage. SEEK IMMEDIATE MEDICAL CARE IF:   Your face is weak and droopy or you have other neurological symptoms.  You have severe joint pain or weakness. MAKE SURE YOU:   Understand these instructions.  Will watch your condition.  Will get help right away if you are not doing well or get worse. FOR MORE INFORMATION Centers for Disease Control and Prevention: FootballExhibition.com.br American Academy [employer]: www.https://powers.com/ Document Released: 12/08/2009 Document Revised: 12/06/2011 Document Reviewed: 12/08/2009 [facility] Patient Information 2013 [place], [place].

## 2013-01-25 LAB — ROCKY MTN SPOTTED FVR AB, IGM-BLOOD: ROCKY MTN SPOTTED FEVER, IGM: 0.66 IV

## 2013-01-25 NOTE — Progress Notes (Signed)
Patient ID: Annette Snow, female   DOB: 1961/08/07, 52 y.o.   MRN: [id number] Patient had to leave without being seen

## 2013-01-27 NOTE — Assessment & Plan Note (Signed)
Patient doing well no fevers, no myalgias, signs of acute illness.

## 2013-01-27 NOTE — Progress Notes (Signed)
Patient ID: Annette Snow, female   DOB: 10/13/1960, 52 y.o.   MRN: [id number] ZETTIE GOOTEE [id number] 06/29/1961 01/27/2013      Progress Note-Follow Up  Subjective  Chief Complaint  Chief Complaint  Patient presents with  . Mass    lump on side of neck- left    HPI  Patient is a 52 year old Caucasian female who is in today concerned about a lymph node on the left side of her neck. It is nontender has been present about a week. She did have a tick bite on her scalp just caudal to this lymph node in the last week. Initially the tick bite was a little bit irritated but is now unremarkable. No fevers or chills. No headaches or neurologic complaints. No itching. No malaise or myalgias. She is confident the tick was not on 24 hours. No chest pain or palpitations. No shortness of breath, GI or GU complaints  Past Medical History  Diagnosis Date  . Ulcer 1984    petic and duodenal  . IBS (irritable bowel syndrome)   . Migraines   . Hemorrhoids   . Blood transfusion   . 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 Neuropathy, sacral plexus 09/13/2011  . Osgood-Schlatter's disease 09/13/2011  . Endometriosis 09/13/2011  . Abdominal pain 09/13/2011  . Foot pain, right 09/13/2011  . Adenomatous colon polyp   . IBS (irritable bowel syndrome)   . ECG abnormal 11/19/2011    Past Surgical History  Procedure Laterality Date  . Laparoscopic hysterectomy  2000    total for extensive endometriosis  . Bunionectomy      bilateral  . Ankle reconstruction      left @ age 65  . Knee arthroscopy      bilateral  3 on left 2 on right  . Breast enhancement surgery    . Wrist reconstruction      left twice  . Foot surgery      left twice  . Tonsillectomy and adenoidectomy    . Laparoscopy      multiple times for endometriosis on all organs  . Abdominal hysterectomy    . 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 Metatarsal osteotomy  11/24/2011    Procedure: METATARSAL OSTEOTOMY;  Surgeon: Lenn Sink, DPM;  Location:  [facility];  Service: Podiatry;  Laterality: Right;  RIGHT AIKENS OSTEOTOMY; RIGHT METATARSAL OSTETOMY Second & Third WITH SCREW; Right Second arthroplasty lesser metatarsalphalangeal with total implant, FUSION Third TOE RIGHT  . Eye surgery  01/23/2012    cosmetic lift, eyelids, b/l    Family History  Problem Relation Age of Onset  . Alzheimer's disease Maternal Grandmother   . Heart attack Maternal Grandfather   . Leukemia Paternal Grandfather   . Liver disease Sister   . Cirrhosis Sister     primary biliary cirrhosis  . Heart disease Father     rheumatic heart disease    History   Social History  . Marital Status: Married    Spouse Name: N/A    Number of Children: N/A  . Years of Education: N/A   Occupational History  . Not on file.   Social History Main Topics  . Smoking status: Former Smoker -- 0.00 packs/day    Quit date: 11/16/2008  . Smokeless tobacco: Never Used  . Alcohol Use: 3.6 oz/week    6 Cans of beer per week  . Drug Use: Yes    Special: Marijuana     Comment: for pain  .  Sexually Active: No   Other Topics Concern  . Not on file   Social History Narrative  . No narrative on file    Current Outpatient Prescriptions on File Prior to Visit  Medication Sig Dispense Refill  . Green Tea, Camillia sinensis, (GREEN TEA EXTRACT PO) Take 1 capsule by mouth daily.      Marland Kitchen omega-3 acid ethyl esters (LOVAZA) 1 G capsule Take 1 g by mouth daily.      Marland Kitchen OVER THE COUNTER MEDICATION Hyloronic Acid- 2 capsules daily      . VIVELLE-DOT 0.1 MG/24HR Place 1 patch onto the skin every 3 (three) days.        No current facility-administered medications on file prior to visit.    Allergies  Allergen Reactions  . Nucynta (Tapentadol Hydrochloride) Itching    Review of Systems  Review of Systems  Constitutional: Negative for fever and malaise/fatigue.  HENT: Negative for congestion.   Eyes: Negative for discharge.  Respiratory: Negative for  shortness of breath.   Cardiovascular: Negative for chest pain, palpitations and leg swelling.  Gastrointestinal: Negative for nausea, abdominal pain and diarrhea.  Genitourinary: Negative for dysuria.  Musculoskeletal: Negative for falls.  Skin: Negative for rash.  Neurological: Negative for loss of consciousness and headaches.  Endo/Heme/Allergies: Negative for polydipsia.  Psychiatric/Behavioral: Negative for depression and suicidal ideas. The patient is not nervous/anxious and does not have insomnia.     Objective  BP 106/70  Pulse 69  Temp(Src) 98.1 F (36.7 C) (Oral)  Ht 5\' 6"  (1.676 m)  Wt 130 lb (58.968 kg)  BMI 20.99 kg/m2  SpO2 98%  Physical Exam  Physical Exam  Constitutional: She is oriented to person, place, and time and well-developed, well-nourished, and in no distress. No distress.  HENT:  Head: Normocephalic and atraumatic.  Eyes: Conjunctivae are normal.  Neck: Neck supple. No thyromegaly present.  Left neck 1 cm firm nodule along mid SCM muscles.  Cardiovascular: Normal rate, regular rhythm and normal heart sounds.   No murmur heard. Pulmonary/Chest: Effort normal and breath sounds normal. She has no wheezes.  Abdominal: She exhibits no distension and no mass.  Musculoskeletal: She exhibits no edema.  Lymphadenopathy:    She has cervical adenopathy.  Neurological: She is alert and oriented to person, place, and time.  Skin: Skin is warm and dry. No rash noted. She is not diaphoretic.  Psychiatric: Memory, affect and judgment normal.    Lab Results  Component Value Date   TSH 1.13 01/24/2013   Lab Results  Component Value Date   WBC 5.7 01/24/2013   HGB 13.5 01/24/2013   HCT 40.3 01/24/2013   MCV 95.6 01/24/2013   PLT 223.0 01/24/2013   Lab Results  Component Value Date   CREATININE 0.7 01/24/2013   BUN 11 01/24/2013   NA 138 01/24/2013   K 4.4 01/24/2013   CL 103 01/24/2013   CO2 30 01/24/2013   Lab Results  Component Value Date   ALT 21  01/24/2013   AST 27 01/24/2013   ALKPHOS 44 01/24/2013   BILITOT 1.1 01/24/2013   Lab Results  Component Value Date   CHOL 194 01/24/2013   Lab Results  Component Value Date   HDL 98.10 01/24/2013   Lab Results  Component Value Date   LDLCALC 86 01/24/2013   Lab Results  Component Value Date   TRIG 50.0 01/24/2013   Lab Results  Component Value Date   CHOLHDL 2 01/24/2013  Assessment & Plan  Nodule of neck Present 1 week, not enlarging or painful. Did have a tick bite on her scalp caudal to this lesion just prior to appearance. RMSF and Lyme Disease titers negative. She will watch the lesion and if it does not resolve in next couple of weeks will refer to surgery for excision  Spleen absent Patient doing well no fevers, no myalgias, signs of acute illness.

## 2013-01-27 NOTE — Assessment & Plan Note (Signed)
Present 1 week, not enlarging or painful. Did have a tick bite on her scalp caudal to this lesion just prior to appearance. RMSF and Lyme Disease titers negative. She will watch the lesion and if it does not resolve in next couple of weeks will refer to surgery for excision

## 2013-04-29 IMAGING — US US BREAST*L*
1 series · 8 of 8 positions shown · non-contrast
Comparison: Multiple priors

CLINICAL DATA: Abnormal screening, left breast

DIGITAL DIAGNOSTIC LEFT MAMMOGRAM WITHOUT CAD AND LEFT BREAST
ULTRASOUND:

[Series 1: us breast*left* · 8 of 8 slices shown]
[im 1/8]
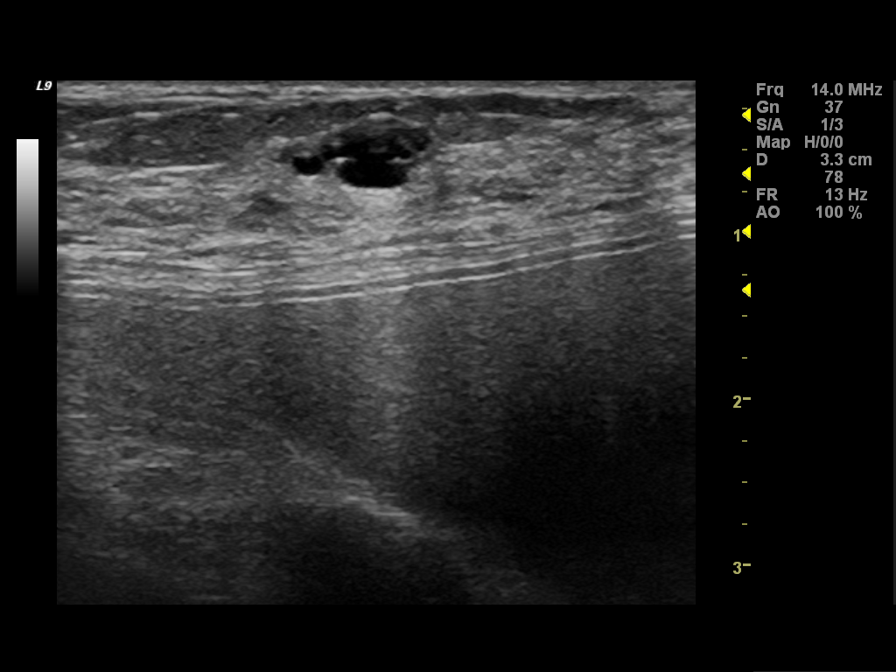
[im 2/8]
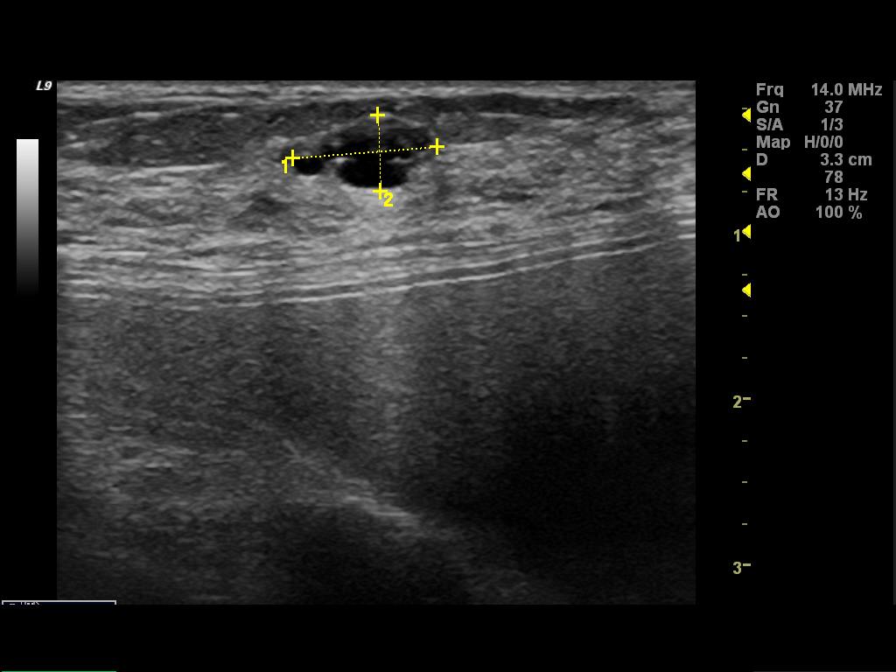
[im 3/8]
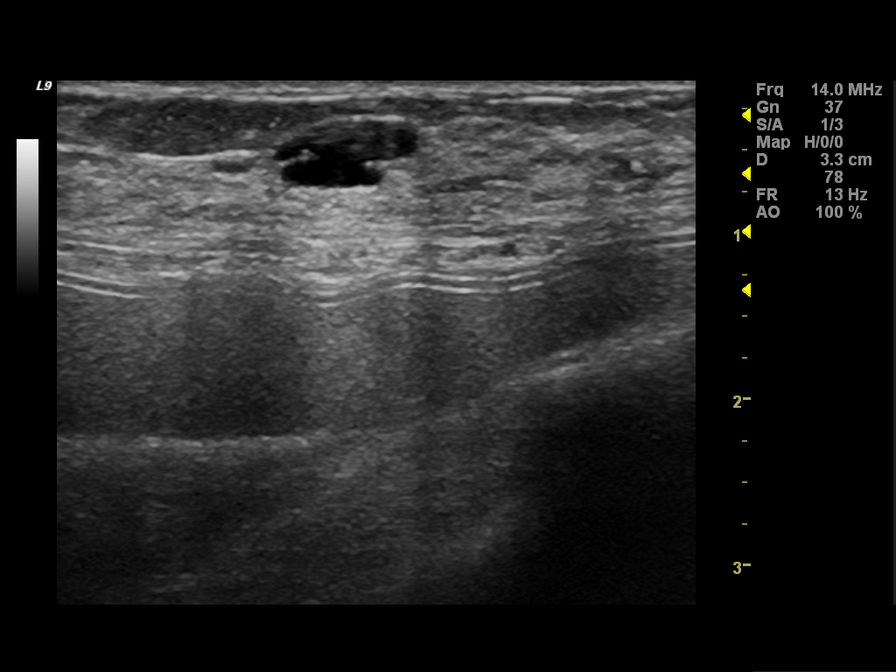
[im 4/8]
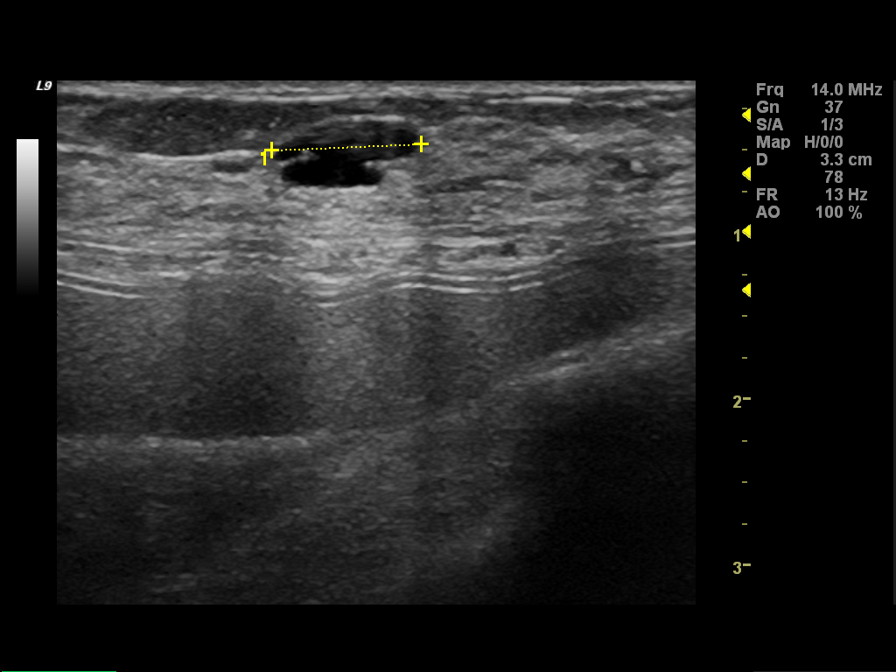
[im 5/8]
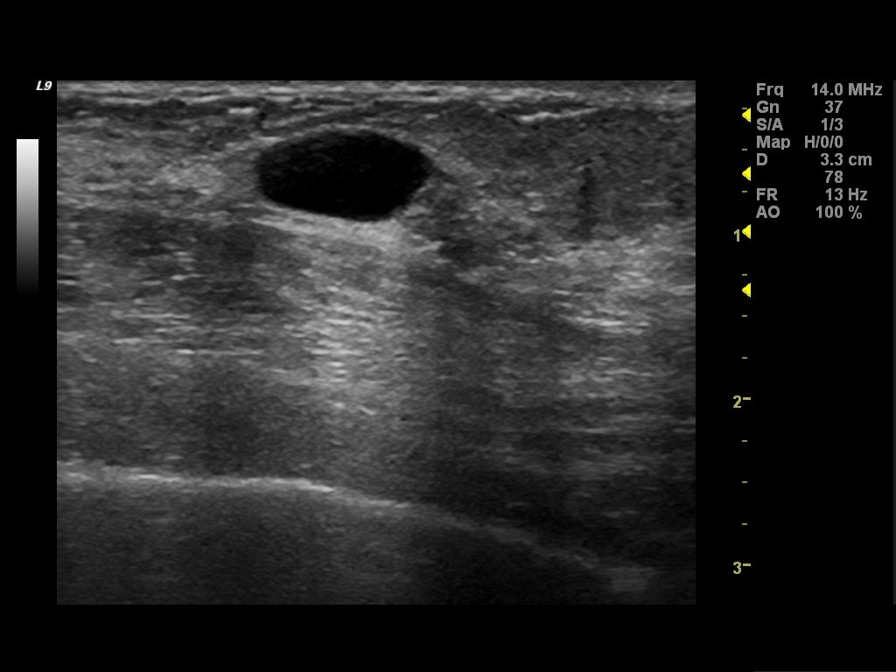
[im 6/8]
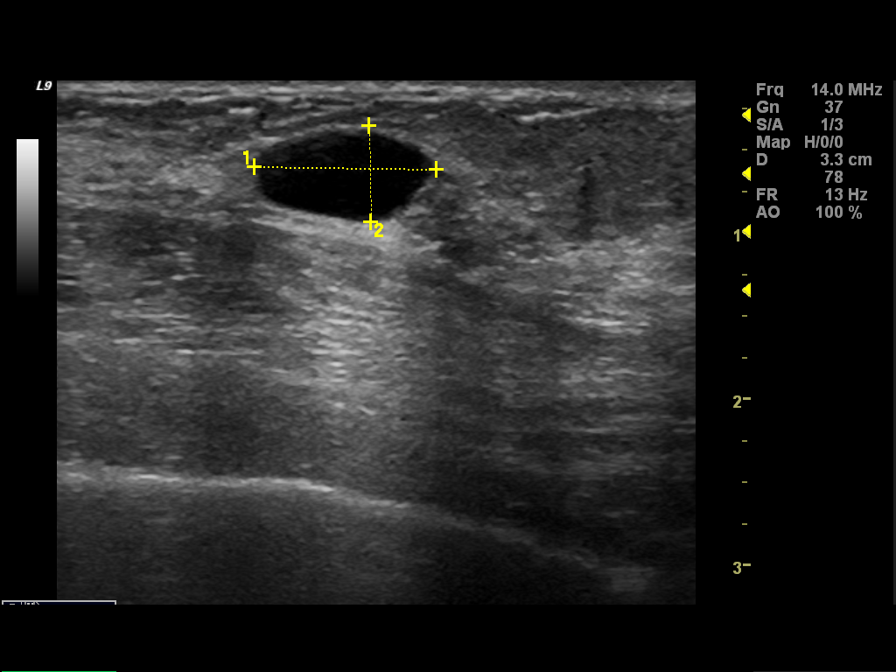
[im 7/8]
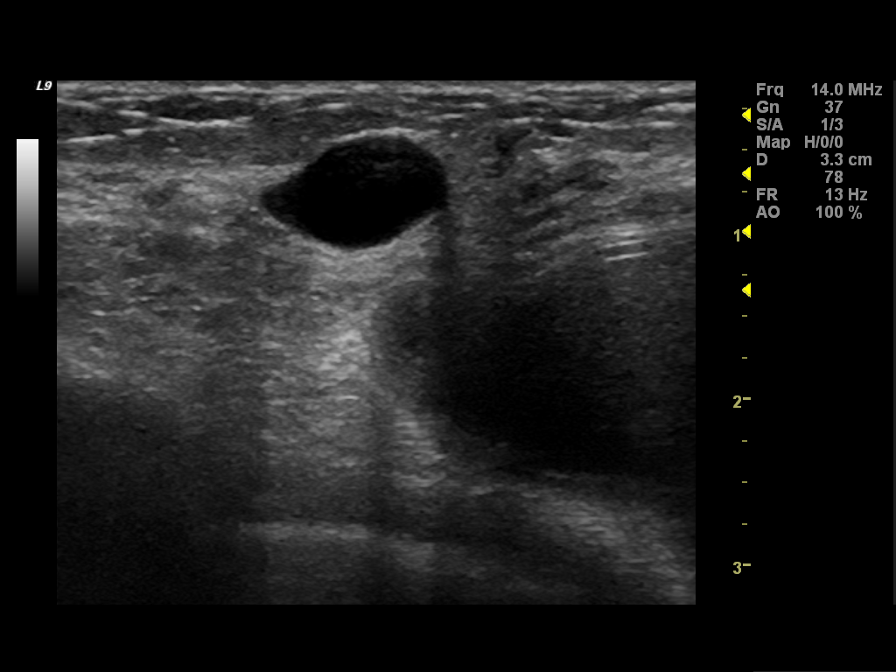
[im 8/8]
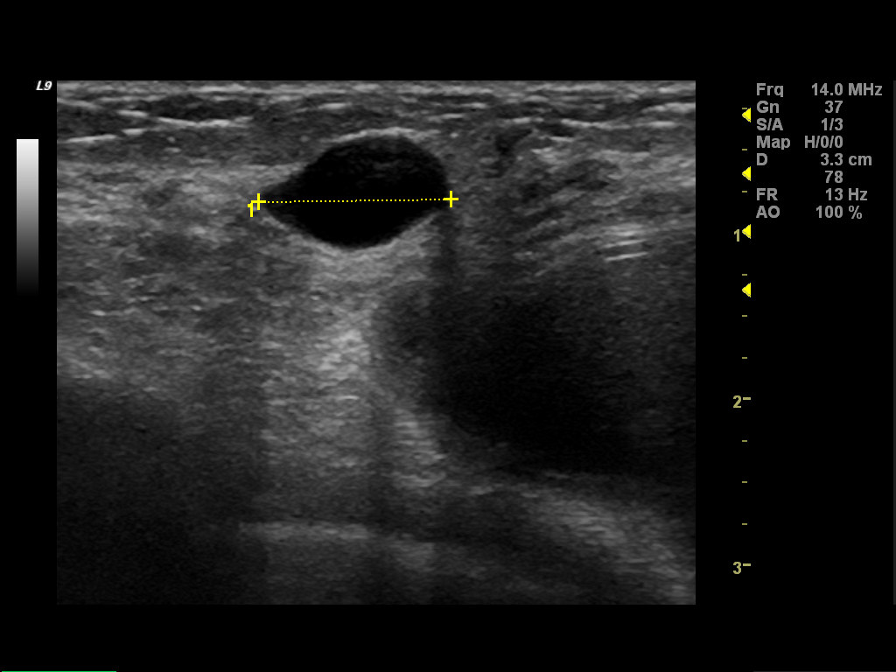

[8 of 8 positions shown; findings below may reference images not displayed]

FINDINGS: 90 degree lateral and spot compression views in the
upper aspect of the left breast, middle third demonstrates a round,
low-density mass with partially circumscribed partially obscured
margins.

On physical exam, I palpate prominent glandular tissue in the upper-
outer quadrant of the left breast without discrete mass.

Ultrasound is performed, showing a cluster of cysts in the 1
o'clock position, 3 cm the nipple measuring 0.9 x 0.5 x 0.9 cm.  A
cyst is also seen in the 11 o'clock position, 4 cm the nipple
measuring 1.1 x 0.6 x 1.2 cm.  This is compatible with the mass
seen mammographically.
IMPRESSION: Cysts, left breast.  Recommend screening mammography in 1 year.

BI-RADS CATEGORY 2:  Benign finding(s).

## 2013-06-25 ENCOUNTER — Telehealth: Payer: Self-pay | Admitting: Family Medicine

## 2013-06-25 NOTE — Telephone Encounter (Signed)
Please advise? Is it tdap?

## 2013-06-25 NOTE — Telephone Encounter (Signed)
Patient states that she is due for her flu shot and another vaccine(does not know the name of the vaccine). She would like to know what vaccine it is that she is due for before she schedules appointment.

## 2013-06-25 NOTE — Telephone Encounter (Signed)
Yes perour chart she is due for a Tdap shot

## 2013-06-26 NOTE — Telephone Encounter (Signed)
Patient informed and appt scheduled for 07-02-13

## 2013-06-26 NOTE — Telephone Encounter (Signed)
LMOVM to return my call  

## 2013-07-02 ENCOUNTER — Ambulatory Visit (INDEPENDENT_AMBULATORY_CARE_PROVIDER_SITE_OTHER): Payer: 59

## 2013-07-02 DIAGNOSIS — Z23 Encounter for immunization: Secondary | ICD-10-CM

## 2013-07-02 NOTE — Progress Notes (Signed)
  Subjective:    Patient ID: Annette Snow, female    DOB: [birth year], 52 y.o.   MRN: [id number]  HPI    Review of Systems     Objective:   Physical Exam        Assessment & Plan:  Patient came in today for a TDAP and flu vaccination. Pt tolerated injections well

## 2013-09-03 IMAGING — US US ABDOMEN COMPLETE
1 series · 14 of 25 positions shown · non-contrast
Comparison: 10/05/2011

CLINICAL DATA: Left upper quadrant pain.

COMPLETE ABDOMINAL ULTRASOUND

[Series 1: us abdomen complete · 0.24mm/px · 14 of 61 slices shown]
[im 1/61]
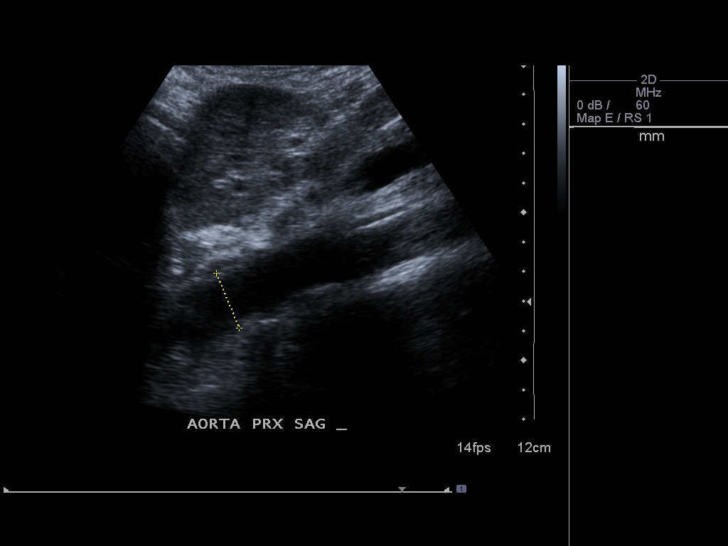
[im 6/61]
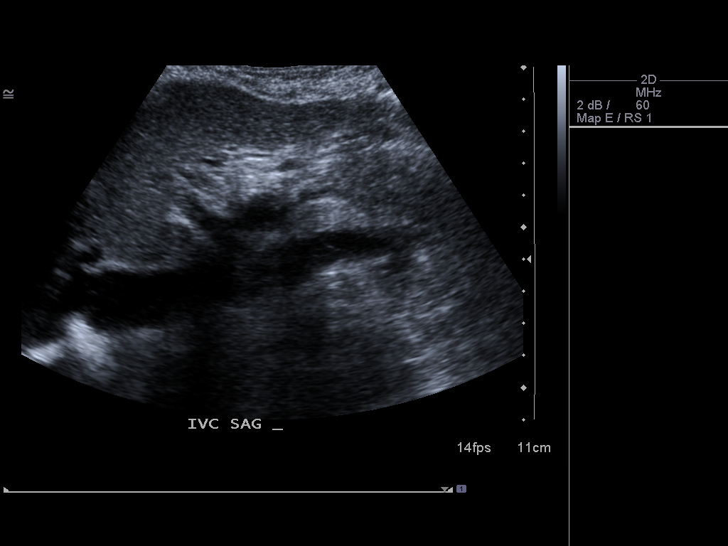
[im 11/61]
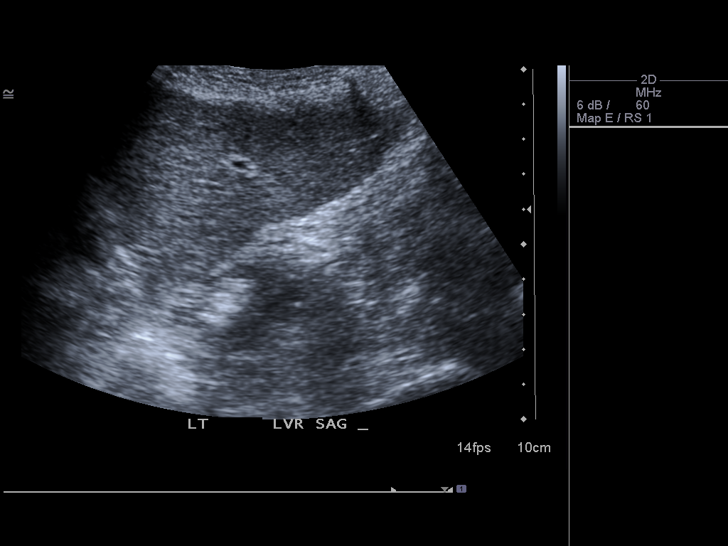
[im 16/61]
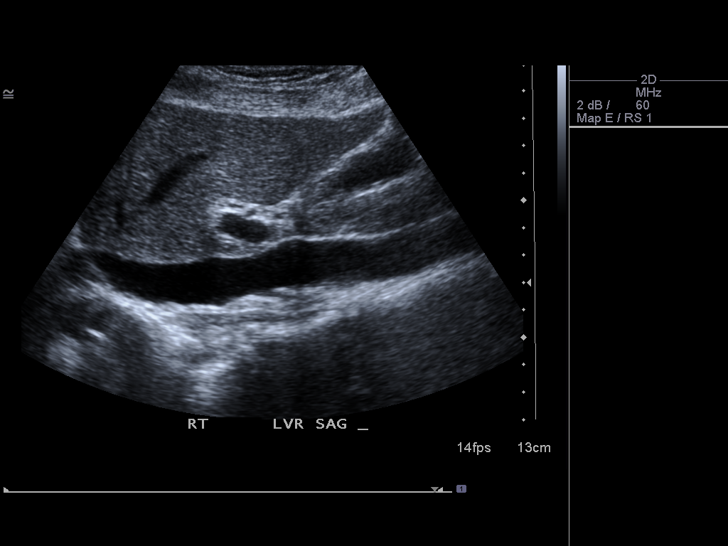
[im 21/61]
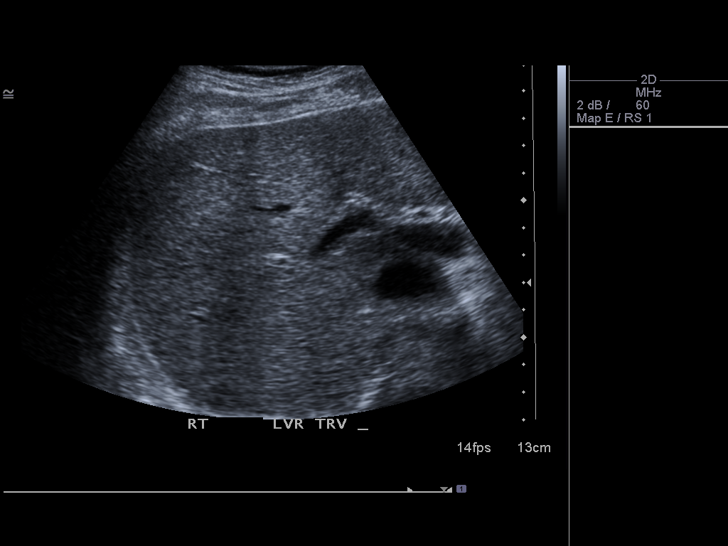
[im 23/61]
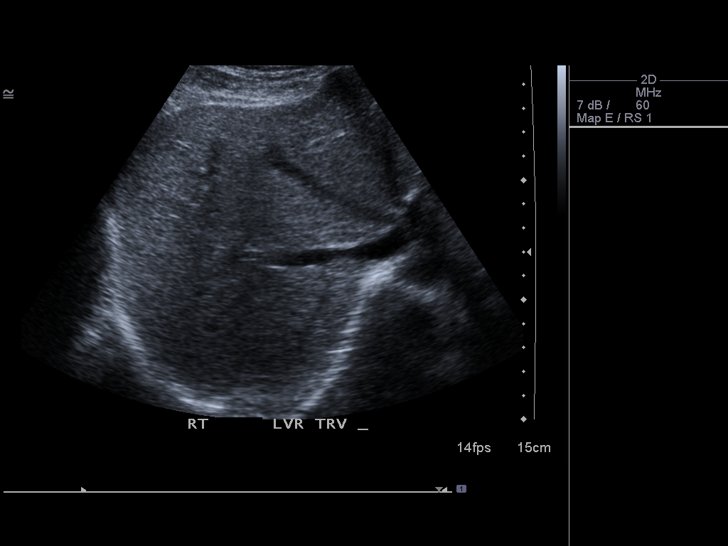
[im 28/61]
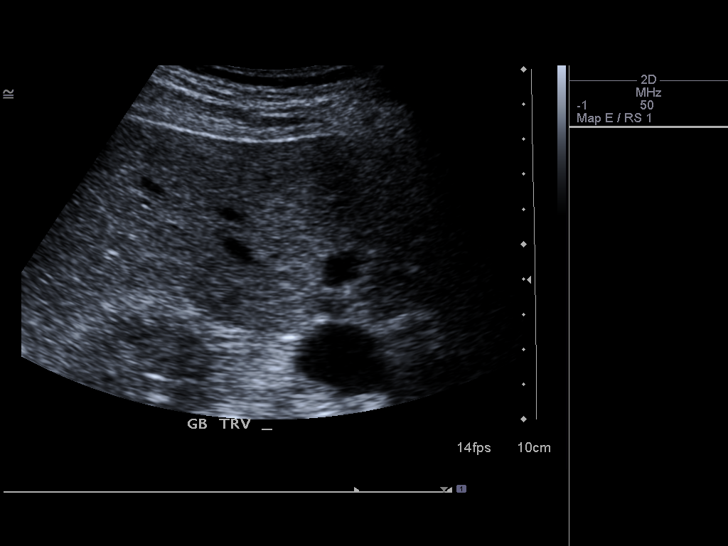
[im 33/61]
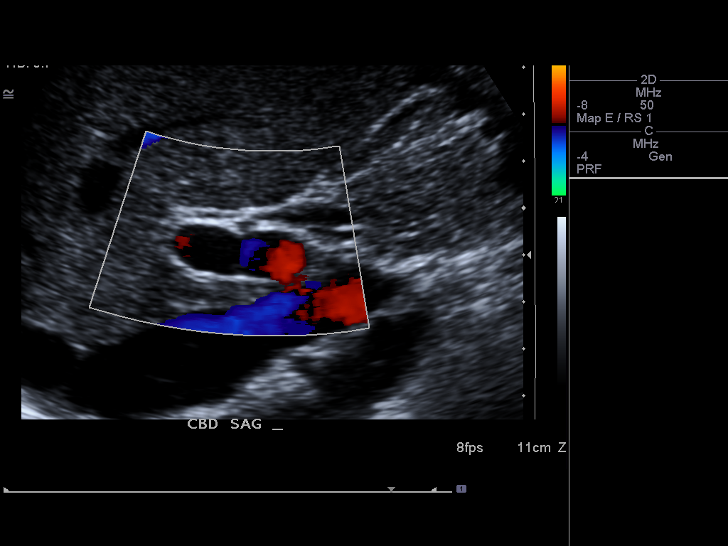
[im 38/61]
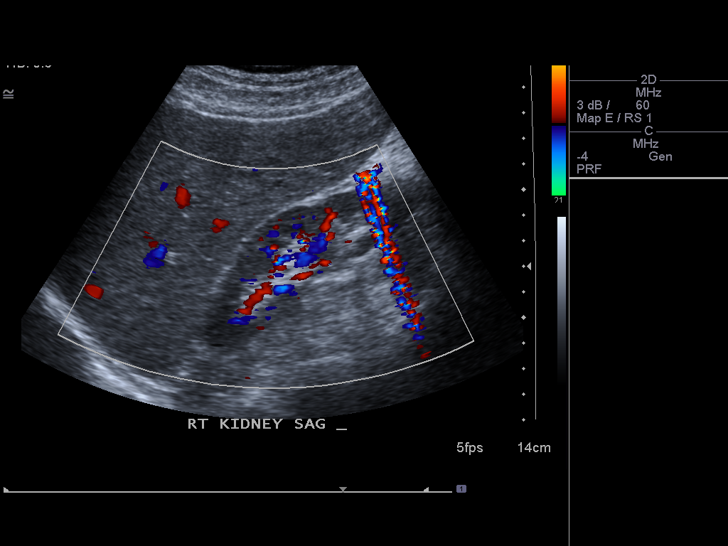
[im 41/61]
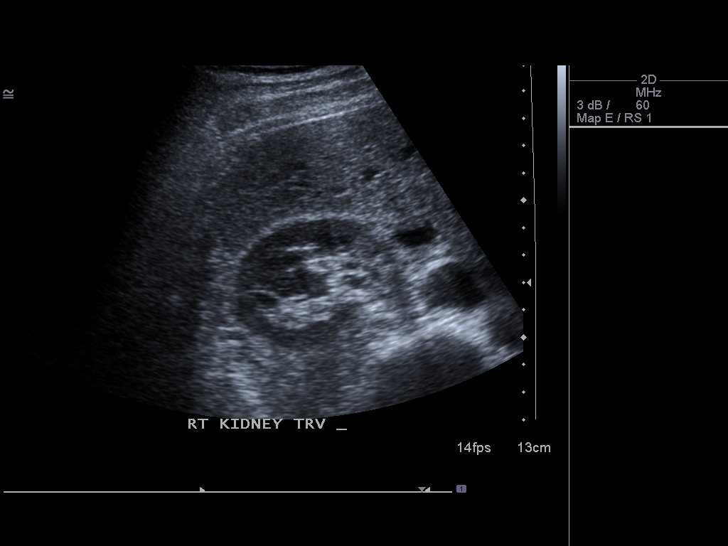
[im 46/61]
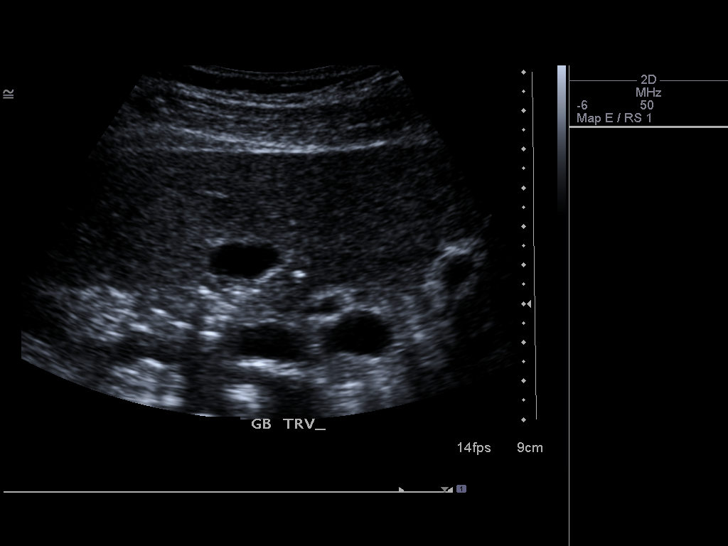
[im 51/61]
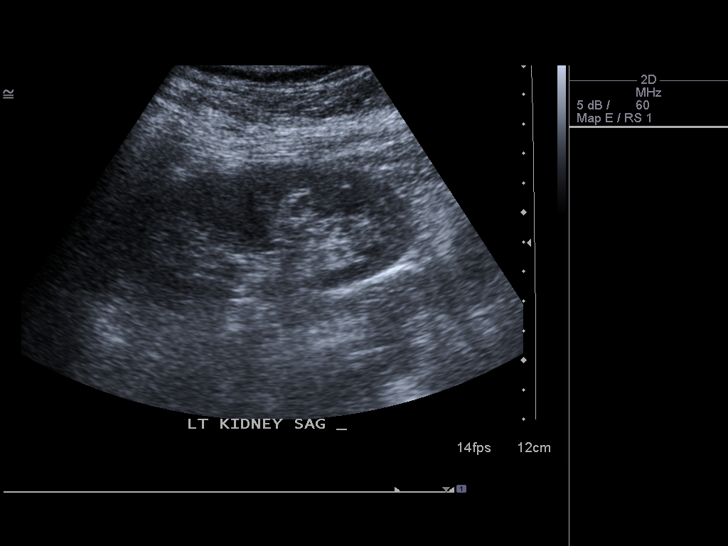
[im 56/61]
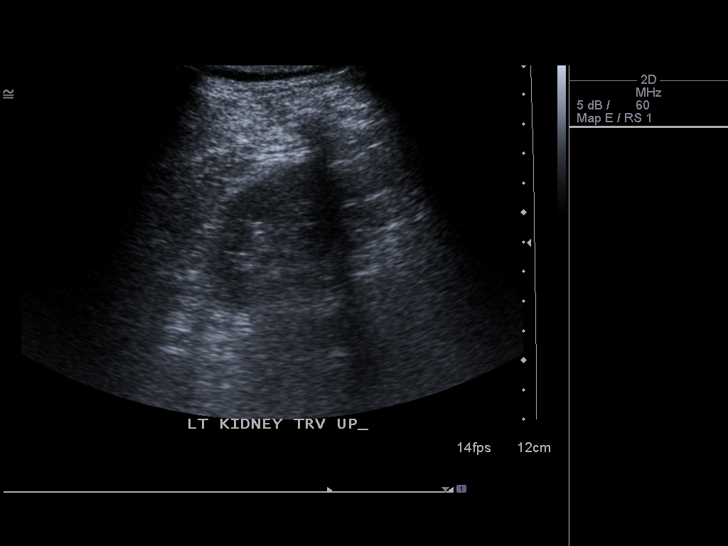
[im 61/61]
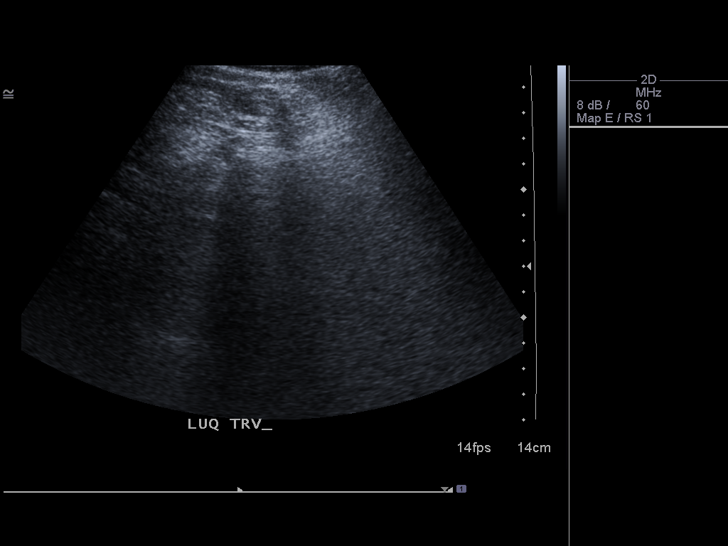

[14 of 25 positions shown; findings below may reference images not displayed]

FINDINGS: Gallbladder:  No gallstones, gallbladder wall thickening, or
pericholecystic fluid.

Common bile duct:   Within normal limits in caliber.

Liver:  No focal lesion identified.  Within normal limits in
parenchymal echogenicity.

IVC:  Appears normal.

Pancreas:  No focal abnormality seen.

Spleen:  Prior splenectomy.

Right Kidney:   Normal in size and parenchymal echogenicity.  No
evidence of mass or hydronephrosis.

Left Kidney:  Normal in size and parenchymal echogenicity.  No
evidence of mass or hydronephrosis.

Abdominal aorta:  No aneurysm identified.
IMPRESSION: No acute findings.  Prior splenectomy.

## 2013-10-26 IMAGING — US IR US GUIDANCE
1 series · 9 of 9 positions shown · non-contrast
Comparison: Ultrasound guided drainage catheter placement -
08/03/2012

CLINICAL DATA: Recurrent symptomatic abdominal wall seroma

ULTRASOUND GUIDED DRAINAGE CATHETER PLACEMENT

[Series 1: ir us guidance · 9 of 9 slices shown]
[im 1/9]
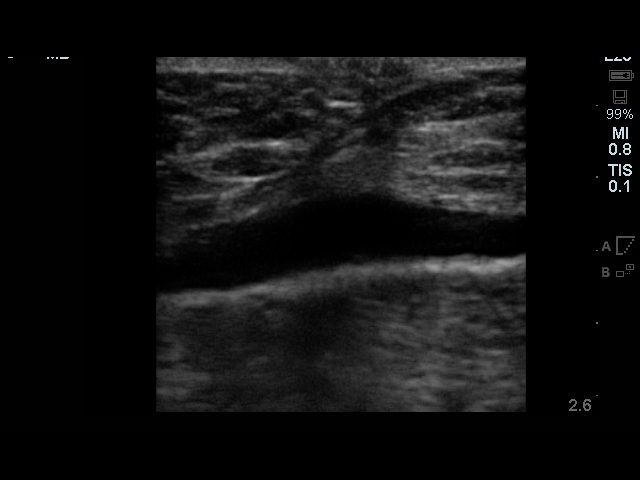
[im 2/9]
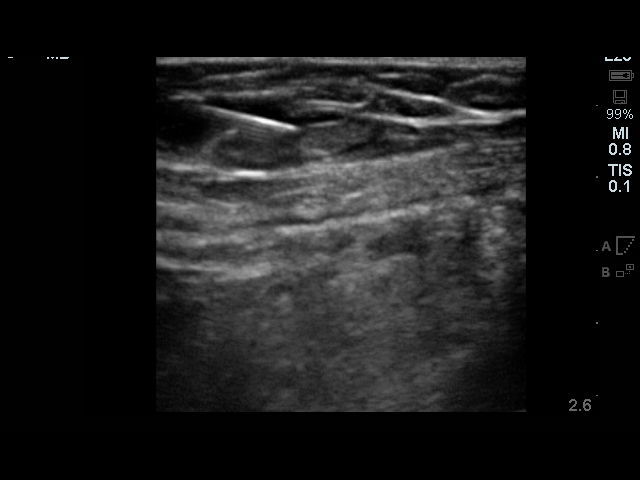
[im 3/9]
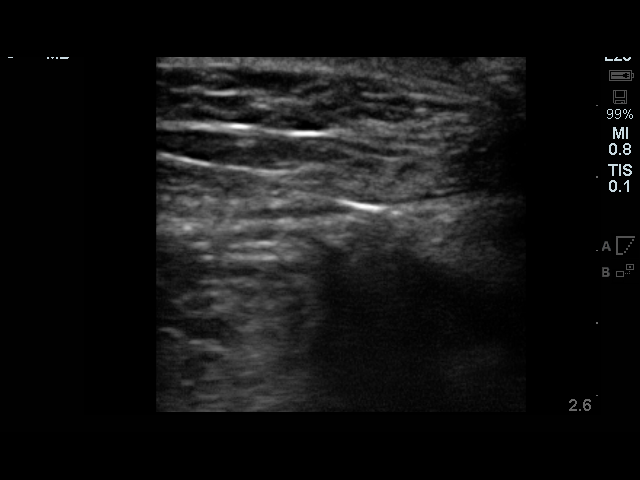
[im 4/9]
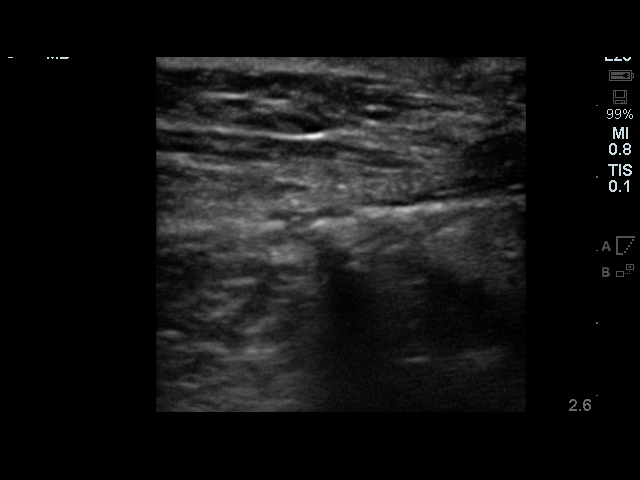
[im 5/9]
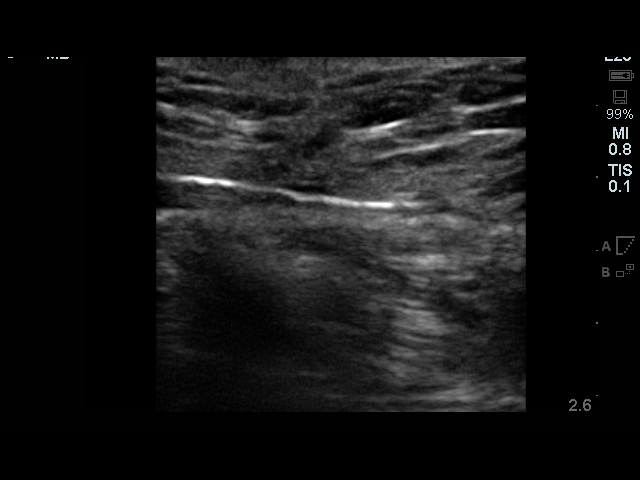
[im 6/9]
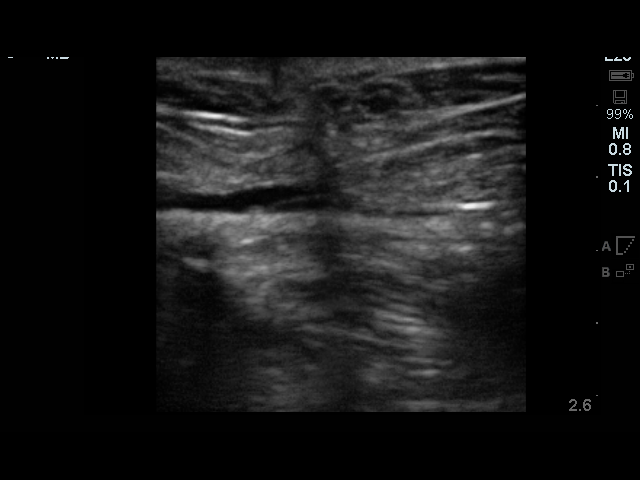
[im 7/9]
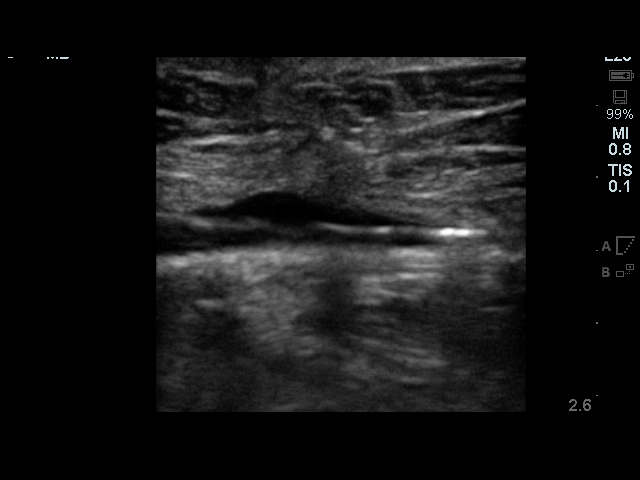
[im 8/9]
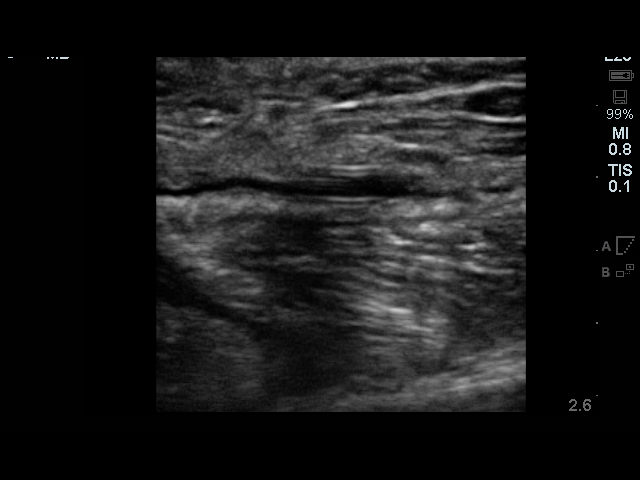
[im 9/9]
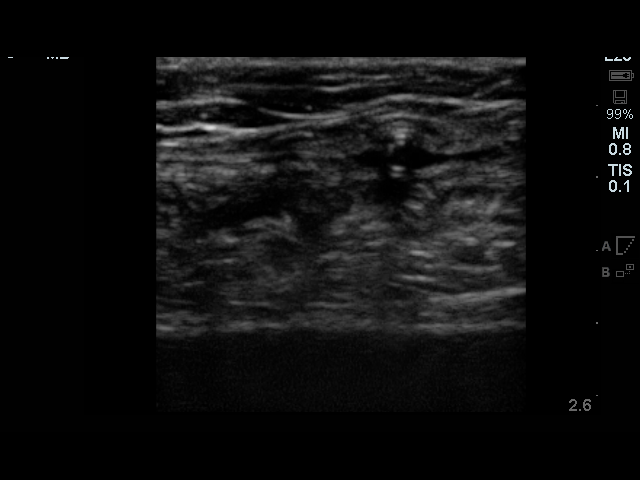

[9 of 9 positions shown; findings below may reference images not displayed]

Intravenous Medications: Fentanyl 50 mcg IV; Versed 1 mg IV

Total Moderate Sedation Time: 15 minutes

Complications: None immediate

Technique / Findings:

Informed written consent was obtained from the patient after a
discussion of the risks, benefits and alternatives to treatment.
Questions regarding the procedure were encouraged and answered.  A
timeout was performed prior to the initiation of the procedure.

Preprocedural ultrasound scanning demonstrated a small simple
appearing anechoic fluid collection within the subcutaneous tissues
adjacent to the midline abdominal wall incision, grossly similar to
prior examination performed 08/03/2012.

The right mid abdomen was prepped and draped in the usual sterile
fashion, and a sterile drape was applied covering the operative
field.  Maximum barrier sterile technique with sterile gowns and
gloves were used for the procedure.  A timeout was performed prior
to the initiation of the procedure.  Local anesthesia was provided
with 1% lidocaine.

Under direct ultrasound guidance, the fluid collection was accessed
with an 18 gauge trocar needle.  This allowed for advancement of a
Benson wire to the superior left lateral aspect of the collection.
The tract was dilated, ultimately allowing placement of an 8-French
all-purpose drainage catheter.  The catheter was coiled locked.

Approximately 25 ml of serous, slightly brown colored fluid was
aspirated.  The catheter was secured to the skin site with
interrupted suture and a stat lock device. The catheter was
connected to JP bulb.  Dressings were applied.  The patient
tolerated procedure well without immediate postprocedural
complication.
IMPRESSION: Successful ultrasound-guided placement of drainage catheter into
recurrent symptomatic subcutaneous anterior abdominal wall seroma
yielding approximately 25 ml of serous, slightly brown colored
fluid.

## 2014-03-07 ENCOUNTER — Encounter: Payer: Self-pay | Admitting: Podiatry

## 2014-03-07 ENCOUNTER — Ambulatory Visit (INDEPENDENT_AMBULATORY_CARE_PROVIDER_SITE_OTHER): Payer: PRIVATE HEALTH INSURANCE

## 2014-03-07 ENCOUNTER — Ambulatory Visit (INDEPENDENT_AMBULATORY_CARE_PROVIDER_SITE_OTHER): Payer: PRIVATE HEALTH INSURANCE | Admitting: Podiatry

## 2014-03-07 VITALS — BP 111/68 | HR 57 | Resp 16

## 2014-03-07 DIAGNOSIS — M779 Enthesopathy, unspecified: Secondary | ICD-10-CM

## 2014-03-07 DIAGNOSIS — M21619 Bunion of unspecified foot: Secondary | ICD-10-CM

## 2014-03-07 MED ORDER — TRIAMCINOLONE ACETONIDE 10 MG/ML IJ SUSP
10.0000 mg | Freq: Once | INTRAMUSCULAR | Status: AC
  Administered 2014-03-07: 10 mg

## 2014-03-07 NOTE — Progress Notes (Signed)
Subjective:     Patient ID: Annette Snow, female   DOB: 17-Nov-1960, 53 y.o.   MRN: [id number]  HPI patient presents stating that the bone in the outside of her right foot is really hurting in the joint of her second metatarsal left foot is bothering her. 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 The proximal nerve block left aspirated the joint and was able to get out of a small amount of clear fluid and injected with half cc of dexamethasone Kenalog and discussed surgery for the right foot. She wants this done but needs to wait a little bit and I did allow her today to go over consent form explaining all possible complications with the surgery and the fact that total recovery from procedure such as this can take upwards of 6 months to one year. Patient wants surgery signed consent form and will have that scheduled at this time

## 2014-03-07 NOTE — Patient Instructions (Signed)
Pre-Operative Instructions  Congratulations, you have decided to take an important step to improving your quality of life.  You can be assured that the doctors of [facility] will be with you every step of the way.  1. Plan to be at the surgery center/hospital at least 1 (one) hour prior to your scheduled time unless otherwise directed by the surgical center/hospital staff.  You must have a responsible adult accompany you, remain during the surgery and drive you home.  Make sure you have directions to the surgical center/hospital and know how to get there on time. 2. For hospital based surgery you will need to obtain a history and physical form from your family physician within 1 month prior to the date of surgery- we will give you a form for you primary physician.  3. We make every effort to accommodate the date you request for surgery.  There are however, times where surgery dates or times have to be moved.  We will contact you as soon as possible if a change in schedule is required.   4. No Aspirin/Ibuprofen for one week before surgery.  If you are on aspirin, any non-steroidal anti-inflammatory medications (Mobic, Aleve, Ibuprofen) you should stop taking it 7 days prior to your surgery.  You make take Tylenol  For pain prior to surgery.  5. Medications- If you are taking daily heart and blood pressure medications, seizure, reflux, allergy, asthma, anxiety, pain or diabetes medications, make sure the surgery center/hospital is aware before the day of surgery so they may notify you which medications to take or avoid the day of surgery. 6. No food or drink after midnight the night before surgery unless directed otherwise by surgical center/hospital staff. 7. No alcoholic beverages 24 hours prior to surgery.  No smoking 24 hours prior to or 24 hours after surgery. 8. Wear loose pants or shorts- loose enough to fit over bandages, boots, and casts. 9. No slip on shoes, sneakers are best. 10. Bring  your boot with you to the surgery center/hospital.  Also bring crutches or a walker if your physician has prescribed it for you.  If you do not have this equipment, it will be provided for you after surgery. 11. If you have not been contracted by the surgery center/hospital by the day before your surgery, call to confirm the date and time of your surgery. 12. Leave-time from work may vary depending on the type of surgery you have.  Appropriate arrangements should be made prior to surgery with your employer. 13. Prescriptions will be provided immediately following surgery by your doctor.  Have these filled as soon as possible after surgery and take the medication as directed. 14. Remove nail polish on the operative foot. 15. Wash the night before surgery.  The night before surgery wash the foot and leg well with the antibacterial soap provided and water paying special attention to beneath the toenails and in between the toes.  Rinse thoroughly with water and dry well with a towel.  Perform this wash unless told not to do so by your physician.  Enclosed: 1 Ice pack (please put in freezer the night before surgery)   1 Hibiclens skin cleaner   Pre-op Instructions  If you have any questions regarding the instructions, do not hesitate to call our office.  [place]: [address] [place], [place] [address] [phone]  [place]: [address] [place], [place] [address] [phone]  [place]: [address]  [place], [place] [address] [phone]  Dr. Richard   Tuchman DPM, Dr. Norman Regal DPM Dr. Richard Sikora DPM, Dr. M. Todd Hyatt DPM, Dr. Kathryn Egerton DPM 

## 2014-04-05 ENCOUNTER — Ambulatory Visit (INDEPENDENT_AMBULATORY_CARE_PROVIDER_SITE_OTHER): Payer: PRIVATE HEALTH INSURANCE | Admitting: *Deleted

## 2014-04-05 DIAGNOSIS — M2010 Hallux valgus (acquired), unspecified foot: Secondary | ICD-10-CM

## 2014-04-05 DIAGNOSIS — M21619 Bunion of unspecified foot: Secondary | ICD-10-CM

## 2014-04-05 NOTE — Patient Instructions (Signed)

## 2014-04-05 NOTE — Progress Notes (Signed)
   Subjective:    Patient ID: Annette Snow, female    DOB: May 26, 1961, 53 y.o.   MRN: [id number]  HPI  PICK UP ORTHOTICS AND GIVEN INSTRUCTION.  Review of Systems     Objective:   Physical Exam        Assessment & Plan:

## 2014-04-08 ENCOUNTER — Ambulatory Visit (INDEPENDENT_AMBULATORY_CARE_PROVIDER_SITE_OTHER): Payer: 59 | Admitting: Physician Assistant

## 2014-04-08 ENCOUNTER — Encounter: Payer: Self-pay | Admitting: Physician Assistant

## 2014-04-08 VITALS — BP 100/66 | HR 66 | Temp 99.2°F | Resp 16 | Ht 66.0 in | Wt 134.1 lb

## 2014-04-08 DIAGNOSIS — C44712 Basal cell carcinoma of skin of right lower limb, including hip: Secondary | ICD-10-CM

## 2014-04-08 DIAGNOSIS — C44711 Basal cell carcinoma of skin of unspecified lower limb, including hip: Secondary | ICD-10-CM | POA: Insufficient documentation

## 2014-04-08 DIAGNOSIS — B009 Herpesviral infection, unspecified: Secondary | ICD-10-CM

## 2014-04-08 MED ORDER — FAMCICLOVIR 500 MG PO TABS: ORAL_TABLET | ORAL | Status: DC

## 2014-04-08 NOTE — Assessment & Plan Note (Signed)
Famciclovir to use at onset of outbreak.  Dosing discussed with patient.

## 2014-04-08 NOTE — Assessment & Plan Note (Signed)
Concern for BCC.  Will refer to dermatology for evaluation and biopsy to confirm.

## 2014-04-08 NOTE — Patient Instructions (Signed)
Please take medication as directed at onset of symptoms.  For the lesion on your leg, I am sending you to Dermatology to rule out skin cancer.  Avoid excess sun exposure.  Follow-up with your primary care doctor as directed.

## 2014-04-08 NOTE — Progress Notes (Signed)
Patient presents to clinic today with multiple complaints.  Patient c/o recurrent episodes of a blistering rash on her buttocks that she has been experiencing during times of stress for several years.  Area is always painful before blistering rash appears.  States rash is burning and itchy in nature. Always around the buttock region.  Rash has resolved itself at present.  Patient also c/o scaly and raised lesion of her right lower leg that has been present for several months.  States it is growing slowly larger.  Notes some ulceration.  Has history of excessive sun exposure.  Denies hx of skin cancer.   Past Medical History  Diagnosis Date  . Ulcer 1984    petic and duodenal  . IBS (irritable bowel syndrome)   . Migraines   . Hemorrhoids   . Blood transfusion   . 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 Neuropathy, sacral plexus 09/13/2011  . Osgood-Schlatter's disease 09/13/2011  . Endometriosis 09/13/2011  . Abdominal pain 09/13/2011  . Foot pain, right 09/13/2011  . Adenomatous colon polyp   . IBS (irritable bowel syndrome)   . ECG abnormal 11/19/2011    Current Outpatient Prescriptions on File Prior to Visit  Medication Sig Dispense Refill  . Green Tea, Camillia sinensis, (GREEN TEA EXTRACT PO) Take 1 capsule by mouth daily.      Marland Kitchen omega-3 acid ethyl esters (LOVAZA) 1 G capsule Take 1 g by mouth daily.      Marland Kitchen OVER THE COUNTER MEDICATION Hyloronic Acid- 2 capsules daily      . VIVELLE-DOT 0.1 MG/24HR Place 1 patch onto the skin every 3 (three) days.        No current facility-administered medications on file prior to visit.    Allergies  Allergen Reactions  . Adhesive [Tape]   . Demerol [Meperidine]   . Nucynta [Tapentadol Hydrochloride] Itching    Family History  Problem Relation Age of Onset  . Alzheimer's disease Maternal Grandmother   . Heart attack Maternal Grandfather   . Leukemia Paternal Grandfather   . Liver disease Sister   . Cirrhosis Sister     primary biliary  cirrhosis  . Heart disease Father     rheumatic heart disease    History   Social History  . Marital Status: Married    Spouse Name: N/A    Number of Children: N/A  . Years of Education: N/A   Social History Main Topics  . Smoking status: Former Smoker -- 0.00 packs/day    Quit date: 11/16/2008  . Smokeless tobacco: Never Used  . Alcohol Use: 3.6 oz/week    6 Cans of beer per week  . Drug Use: Yes    Special: Marijuana     Comment: for pain  . Sexual Activity: No   Other Topics Concern  . None   Social History Narrative  . None   Review of Systems - See HPI.  All other ROS are negative.  BP 100/66  Pulse 66  Temp(Src) 99.2 F (37.3 C) (Oral)  Resp 16  Ht 5\' 6"  (1.676 m)  Wt 134 lb 2 oz (60.839 kg)  BMI 21.66 kg/m2  SpO2 99%  Physical Exam  Vitals reviewed. Constitutional: She is oriented to person, place, and time and well-developed, well-nourished, and in no distress.  HENT:  Head: Normocephalic and atraumatic.  Eyes: Conjunctivae are normal.  Neck: Neck supple.  Cardiovascular: Normal rate, regular rhythm, normal heart sounds and intact distal pulses.   Pulmonary/Chest: Effort normal and breath  sounds normal. No respiratory distress. She has no wheezes. She has no rales. She exhibits no tenderness.  Neurological: She is alert and oriented to person, place, and time.  Skin: Skin is warm and dry.  Presence of a 1 cm raised ulcerated lesion on a pearly base, located on the lower right leg.  No evidence of lesion elsewhere.  Concern for malignancy.  Psychiatric: Affect normal.   Assessment/Plan: HSV infection Famciclovir to use at onset of outbreak.  Dosing discussed with patient.  BCC (basal cell carcinoma), leg Concern for BCC.  Will refer to dermatology for evaluation and biopsy to confirm.

## 2014-04-08 NOTE — Progress Notes (Signed)
Pre visit review using our clinic review tool, if applicable. No additional management support is needed unless otherwise documented below in the visit note/SLS  

## 2014-04-09 ENCOUNTER — Telehealth: Payer: Self-pay | Admitting: Family Medicine

## 2014-04-09 NOTE — Telephone Encounter (Signed)
Patient called in stating that she is very upset over her last office visit with [facility]. She states that she went in for a rash on buttocks and bruising. Patient states that Einar Pheasant did not address this issue, only addressed concern over carcinoma on leg.

## 2014-04-09 NOTE — Telephone Encounter (Signed)
Please call patient

## 2014-04-09 NOTE — Telephone Encounter (Signed)
I am quite confused by this as the patient was seen for recurrent vesicular rash of her buttock that she has experienced intermittently for several years.  Rash was healed at time of visit, except for some post-inflammatory skin changes.  Rash most likely a herpes virus (shingles, etc.).  Patient states she had been given oral medication for rash before.  Patient was given a prescription for famciclovir to try at next onset of symptoms.  Therefore, buttock "rash" was discussed.  Patient also mentioned concern of a lesion on her leg, which I set her up with dermatology due to concern for malignancy. Patient may have mentioned bruise on her leg as an aside, but did not illicit to me that was a reason for her visit. Nor did she voice any additional concerns when asked.  I apologize if she is upset but I did address the concerns she shared with me at her visit.  She is always welcome to schedule an appointment so her concerns of bruising can be discussed. There are multiple providers available to her, including myself, but Dr. Charlett Blake will be back in a couple of weeks.

## 2014-04-10 NOTE — Telephone Encounter (Signed)
Pt left message that she mentioned bruising and fatigue and was told she may need to have blood work but orders were not given to her at last visit.  Spoke with pt and advised her per message below that the focus of her recent visit was her rash and she would need additional office visit to address concerns of bruising and fatigue. Pt voices understanding and requests appt after 04/22/14 as she is going on vacation. Scheduled appt for 05/03/14 at 1:15pm with Dr Charlett Blake.

## 2014-04-10 NOTE — Telephone Encounter (Signed)
Left a message for patient to return my call. 

## 2014-04-15 ENCOUNTER — Ambulatory Visit: Payer: PRIVATE HEALTH INSURANCE | Admitting: Podiatry

## 2014-04-15 ENCOUNTER — Encounter: Payer: Self-pay | Admitting: Podiatry

## 2014-04-15 VITALS — BP 120/68 | HR 63 | Resp 16

## 2014-04-15 DIAGNOSIS — M779 Enthesopathy, unspecified: Secondary | ICD-10-CM

## 2014-04-15 MED ORDER — TRIAMCINOLONE ACETONIDE 10 MG/ML IJ SUSP
10.0000 mg | Freq: Once | INTRAMUSCULAR | Status: AC
  Administered 2014-04-15: 10 mg

## 2014-04-15 NOTE — Progress Notes (Signed)
Subjective:     Patient ID: Annette Snow, female   DOB: 08-08-1961, 53 y.o.   MRN: [id number]  HPI patient states that she had to be on her foot at all times on her left foot and that it is really bothering her and she would like another cortisone injection knowing that someday she may need surgery on   Review of Systems     Objective:   Physical Exam Neurovascular status intact with continued elevation and mild medial dislocation of the second MPJ left with inflammation and pain of the joint surface    Assessment:     Capsulitis second MPJ left with probable flexor plate tear or stretch    Plan:     Proximal nerve block administered and I did re\re injected the joint with a quarter cc of dexamethasone Kenalog after first aspirating clear fluid. Applied thick padding and discussed possible changes and orthotics and dispensed a rigid graphite insole to the left foot

## 2014-04-24 ENCOUNTER — Telehealth: Payer: Self-pay | Admitting: *Deleted

## 2014-04-24 NOTE — Telephone Encounter (Signed)
I left her a message that Dr. Paulla Dolly said he will give you an injection in that foot at the time of your surgery.  There is nothing that needs to be done prior to the procedure.  Call if you have any further questions.

## 2014-04-24 NOTE — Telephone Encounter (Signed)
Upcoming foot surgery with Dr. Paulla Dolly.  Done some work on my left foot.  It's not doing good at all.  I'd like an update.

## 2014-04-24 NOTE — Telephone Encounter (Signed)
I was recently in and he drained fluid off the joint on my left foot.  It was not effective, it's so much worse.  I think he wanted to know because I'm having surgery coming up.  I told her I would see what he says and call her back.

## 2014-04-30 ENCOUNTER — Encounter: Payer: Self-pay | Admitting: Podiatry

## 2014-04-30 DIAGNOSIS — M779 Enthesopathy, unspecified: Secondary | ICD-10-CM

## 2014-04-30 DIAGNOSIS — M21619 Bunion of unspecified foot: Secondary | ICD-10-CM

## 2014-05-03 ENCOUNTER — Telehealth: Payer: Self-pay | Admitting: *Deleted

## 2014-05-03 ENCOUNTER — Ambulatory Visit: Payer: 59 | Admitting: Family Medicine

## 2014-05-03 ENCOUNTER — Telehealth: Payer: Self-pay

## 2014-05-03 ENCOUNTER — Other Ambulatory Visit: Payer: Self-pay | Admitting: Podiatrist

## 2014-05-03 MED ORDER — PROMETHAZINE HCL 25 MG PO TABS: 25.0000 mg | ORAL_TABLET | Freq: Four times a day (QID) | ORAL | Status: DC | PRN

## 2014-05-03 MED ORDER — MEPERIDINE HCL 50 MG PO TABS: 50.0000 mg | ORAL_TABLET | ORAL | Status: DC | PRN

## 2014-05-03 NOTE — Telephone Encounter (Signed)
Spoke with pt regarding post operative status. She stated that she was managing her pain well. Advised to elevate, at rest, up for only 5 min every hour and use ibuprofen in between pain medications doses. Advised to stay in boot and keep dressing dry and intact. Watch for s/s of infection, fever n/v muscle aches and pains

## 2014-05-03 NOTE — Telephone Encounter (Signed)
I called and informed her that Dr. Josephina Shih the refill for the Demerol and Phenergan.   You can come by to pick up the prescription.  She stated she has already picked it up.

## 2014-05-03 NOTE — Telephone Encounter (Signed)
I need a prescription for pain medicine refilled.  I can have someone come by to pick it up.  Let me know if this will work out.

## 2014-05-03 NOTE — Telephone Encounter (Signed)
Entered in error

## 2014-05-06 ENCOUNTER — Ambulatory Visit (INDEPENDENT_AMBULATORY_CARE_PROVIDER_SITE_OTHER): Payer: PRIVATE HEALTH INSURANCE | Admitting: Podiatry

## 2014-05-06 ENCOUNTER — Ambulatory Visit (INDEPENDENT_AMBULATORY_CARE_PROVIDER_SITE_OTHER): Payer: PRIVATE HEALTH INSURANCE

## 2014-05-06 ENCOUNTER — Encounter: Payer: Self-pay | Admitting: Podiatry

## 2014-05-06 VITALS — BP 109/70 | HR 62 | Resp 16

## 2014-05-06 DIAGNOSIS — M779 Enthesopathy, unspecified: Secondary | ICD-10-CM

## 2014-05-06 DIAGNOSIS — M21619 Bunion of unspecified foot: Secondary | ICD-10-CM

## 2014-05-06 DIAGNOSIS — M2010 Hallux valgus (acquired), unspecified foot: Secondary | ICD-10-CM

## 2014-05-06 NOTE — Progress Notes (Signed)
Subjective:     Patient ID: Annette Snow, female   DOB: 28-Jul-1961, 53 y.o.   MRN: [id number]  HPI patient presents stating she had a lot of pain with the right foot but it's feeling much better now and she's able to walk without discomfort   Review of Systems     Objective:   Physical Exam Neurovascular status found to be intact with muscle strength adequate and noted to have negative Homans sign and a well-healed surgical site right fifth metatarsal with wound edges well coapted    Assessment:     Healing well from fifth metatarsal osteotomy right foot 6 days ago    Plan:     X-rays reviewed and sterile dressing reapplied instructed on continuation of immobilization and boot usage for 3 more weeks and reappoint at that time

## 2014-05-07 ENCOUNTER — Ambulatory Visit: Payer: 59 | Admitting: Family Medicine

## 2014-05-16 NOTE — Progress Notes (Signed)
Dr Paulla Dolly performed a met osteotomy 5th right on 04/30/14.

## 2014-05-27 ENCOUNTER — Ambulatory Visit (INDEPENDENT_AMBULATORY_CARE_PROVIDER_SITE_OTHER): Payer: PRIVATE HEALTH INSURANCE | Admitting: Podiatry

## 2014-05-27 ENCOUNTER — Encounter: Payer: Self-pay | Admitting: Podiatry

## 2014-05-27 ENCOUNTER — Ambulatory Visit (INDEPENDENT_AMBULATORY_CARE_PROVIDER_SITE_OTHER): Payer: PRIVATE HEALTH INSURANCE

## 2014-05-27 VITALS — BP 107/69 | HR 64 | Resp 17

## 2014-05-27 DIAGNOSIS — Z9889 Other specified postprocedural states: Secondary | ICD-10-CM

## 2014-05-27 DIAGNOSIS — M21619 Bunion of unspecified foot: Secondary | ICD-10-CM

## 2014-05-27 DIAGNOSIS — M2010 Hallux valgus (acquired), unspecified foot: Secondary | ICD-10-CM

## 2014-05-27 NOTE — Progress Notes (Signed)
   Subjective:    Patient ID: Annette Snow, female    DOB: February 27, 1961, 53 y.o.   MRN: [id number]  HPI Pt is here for routine post op, right foot, doing well, some pain and mild swelling   Review of Systems     Objective:   Physical Exam        Assessment & Plan:

## 2014-05-28 NOTE — Progress Notes (Signed)
Subjective:     Patient ID: Annette Snow, female   DOB: [birth year], 53 y.o.   MRN: [id number]  HPI patient states I'm doing real well with my right foot with minimal discomfort and swelling   Review of Systems     Objective:   Physical Exam Neurovascular status intact with well-healing surgical site fifth metatarsal right with tissue that is healing well    Assessment:     Doing well after having surgery on the right fifth metatarsal    Plan:     Reviewed x-rays and gradually return to soft shoes over the next few weeks. Reappoint for [place] to recheck in 4 weeks earlier if any issues should occur

## 2014-06-08 ENCOUNTER — Emergency Department (HOSPITAL_BASED_OUTPATIENT_CLINIC_OR_DEPARTMENT_OTHER)
Admission: EM | Admit: 2014-06-08 | Discharge: 2014-06-08 | Disposition: A | Discharge status: Home / Self Care | Payer: 59 | Attending: Emergency Medicine | Admitting: Emergency Medicine

## 2014-06-08 ENCOUNTER — Encounter (HOSPITAL_BASED_OUTPATIENT_CLINIC_OR_DEPARTMENT_OTHER): Payer: Self-pay | Admitting: Emergency Medicine

## 2014-06-08 DIAGNOSIS — Z8669 Personal history of other diseases of the nervous system and sense organs: Secondary | ICD-10-CM | POA: Insufficient documentation

## 2014-06-08 DIAGNOSIS — Z860100 Personal history of colon polyps, unspecified: Secondary | ICD-10-CM | POA: Insufficient documentation

## 2014-06-08 DIAGNOSIS — F411 Generalized anxiety disorder: Secondary | ICD-10-CM | POA: Diagnosis not present

## 2014-06-08 DIAGNOSIS — D849 Immunodeficiency, unspecified: Secondary | ICD-10-CM | POA: Insufficient documentation

## 2014-06-08 DIAGNOSIS — S81009A Unspecified open wound, unspecified knee, initial encounter: Secondary | ICD-10-CM | POA: Insufficient documentation

## 2014-06-08 DIAGNOSIS — Z8679 Personal history of other diseases of the circulatory system: Secondary | ICD-10-CM | POA: Diagnosis not present

## 2014-06-08 DIAGNOSIS — Z9089 Acquired absence of other organs: Secondary | ICD-10-CM | POA: Diagnosis not present

## 2014-06-08 DIAGNOSIS — Z8739 Personal history of other diseases of the musculoskeletal system and connective tissue: Secondary | ICD-10-CM | POA: Diagnosis not present

## 2014-06-08 DIAGNOSIS — Z79899 Other long term (current) drug therapy: Secondary | ICD-10-CM | POA: Insufficient documentation

## 2014-06-08 DIAGNOSIS — Z8742 Personal history of other diseases of the female genital tract: Secondary | ICD-10-CM | POA: Insufficient documentation

## 2014-06-08 DIAGNOSIS — Y9389 Activity, other specified: Secondary | ICD-10-CM | POA: Diagnosis not present

## 2014-06-08 DIAGNOSIS — Z87891 Personal history of nicotine dependence: Secondary | ICD-10-CM | POA: Diagnosis not present

## 2014-06-08 DIAGNOSIS — Z8711 Personal history of peptic ulcer disease: Secondary | ICD-10-CM | POA: Diagnosis not present

## 2014-06-08 DIAGNOSIS — Z8719 Personal history of other diseases of the digestive system: Secondary | ICD-10-CM | POA: Diagnosis not present

## 2014-06-08 DIAGNOSIS — S81809A Unspecified open wound, unspecified lower leg, initial encounter: Secondary | ICD-10-CM | POA: Diagnosis present

## 2014-06-08 DIAGNOSIS — Y92410 Unspecified street and highway as the place of occurrence of the external cause: Secondary | ICD-10-CM | POA: Diagnosis not present

## 2014-06-08 DIAGNOSIS — S81811A Laceration without foreign body, right lower leg, initial encounter: Secondary | ICD-10-CM

## 2014-06-08 DIAGNOSIS — S91009A Unspecified open wound, unspecified ankle, initial encounter: Principal | ICD-10-CM

## 2014-06-08 MED ORDER — CEPHALEXIN 500 MG PO CAPS: 500.0000 mg | ORAL_CAPSULE | Freq: Three times a day (TID) | ORAL | Status: DC

## 2014-06-08 MED ORDER — CEPHALEXIN 250 MG PO CAPS
500.0000 mg | ORAL_CAPSULE | Freq: Once | ORAL | Status: AC
  Administered 2014-06-08: 500 mg via ORAL
  Filled 2014-06-08: qty 2

## 2014-06-08 MED ORDER — HYDROCODONE-ACETAMINOPHEN 5-325 MG PO TABS: 1.0000 | ORAL_TABLET | Freq: Four times a day (QID) | ORAL | Status: DC | PRN

## 2014-06-08 MED ORDER — HYDROCODONE-ACETAMINOPHEN 5-325 MG PO TABS
ORAL_TABLET | ORAL | Status: AC
  Filled 2014-06-08: qty 2

## 2014-06-08 MED ORDER — HYDROCODONE-ACETAMINOPHEN 5-325 MG PO TABS
2.0000 | ORAL_TABLET | Freq: Once | ORAL | Status: AC
  Administered 2014-06-08: 2 via ORAL

## 2014-06-08 MED ORDER — PROMETHAZINE HCL 25 MG PO TABS: 25.0000 mg | ORAL_TABLET | Freq: Four times a day (QID) | ORAL | Status: DC | PRN

## 2014-06-08 MED ORDER — PROMETHAZINE HCL 25 MG PO TABS
25.0000 mg | ORAL_TABLET | Freq: Once | ORAL | Status: AC
  Administered 2014-06-08: 25 mg via ORAL
  Filled 2014-06-08: qty 1

## 2014-06-08 NOTE — ED Notes (Signed)
Patient cut her right calf on a bicycle peddle, large lac appx, bleeding controlled at this time.

## 2014-06-08 NOTE — ED Provider Notes (Signed)
CSN: [id number]     Arrival date & time 06/08/14  1000 History   First MD Initiated Contact with Patient 06/08/14 1027     Chief Complaint  Patient presents with  . Extremity Laceration     (Consider location/radiation/quality/duration/timing/severity/associated sxs/prior Treatment) HPI Patient suffered a laceration to her right calf on a bicycle pedal when she was getting off of the bicycle 20 minutes prior to coming here. No other injury.. No treatment prior to coming here. Pain is a right calf. Nonradiating. Severe. Not made better or worse by anything. Past Medical History  Diagnosis Date  . Ulcer 1984    petic and duodenal  . IBS (irritable bowel syndrome)   . Migraines   . Hemorrhoids   . Blood transfusion   . 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 Neuropathy, sacral plexus 09/13/2011  . Osgood-Schlatter's disease 09/13/2011  . Endometriosis 09/13/2011  . Abdominal pain 09/13/2011  . Foot pain, right 09/13/2011  . Adenomatous colon polyp   . IBS (irritable bowel syndrome)   . ECG abnormal 11/19/2011   Past Surgical History  Procedure Laterality Date  . Laparoscopic hysterectomy  2000    total for extensive endometriosis  . Bunionectomy      bilateral  . Ankle reconstruction      left @ age 30  . Knee arthroscopy      bilateral  3 on left 2 on right  . Breast enhancement surgery    . Wrist reconstruction      left twice  . Foot surgery      left twice  . Tonsillectomy and adenoidectomy    . Laparoscopy      multiple times for endometriosis on all organs  . Abdominal hysterectomy    . 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 Metatarsal osteotomy  11/24/2011    Procedure: METATARSAL OSTEOTOMY;  Surgeon: Wallene Huh, DPM;  Location: [facility];  Service: Podiatry;  Laterality: Right;  RIGHT AIKENS OSTEOTOMY; RIGHT METATARSAL OSTETOMY Second & Third WITH SCREW; Right Second arthroplasty lesser metatarsalphalangeal with total implant, FUSION Third TOE RIGHT  . Eye surgery   01/23/2012    cosmetic lift, eyelids, b/l   Family History  Problem Relation Age of Onset  . Alzheimer's disease Maternal Grandmother   . Heart attack Maternal Grandfather   . Leukemia Paternal Grandfather   . Liver disease Sister   . Cirrhosis Sister     primary biliary cirrhosis  . Heart disease Father     rheumatic heart disease   History  Substance Use Topics  . Smoking status: Former Smoker -- 0.00 packs/day    Quit date: 11/16/2008  . Smokeless tobacco: Never Used  . Alcohol Use: 3.6 oz/week    6 Cans of beer per week   marijuana use OB History   Grav Para Term Preterm Abortions TAB SAB Ect Mult Living                 Review of Systems  Constitutional: Negative.   Skin: Positive for wound.  Allergic/Immunologic: Positive for immunocompromised state.       Status post splenectomy  Hematological: Negative.       Allergies  Adhesive; Demerol; and Nucynta  Home Medications   Prior to Admission medications   Medication Sig Start Date End Date Taking? Authorizing Provider  famciclovir (FAMVIR) 500 MG tablet Take 3 tablets at onset of symptoms.  Do not take more that 3 tablets per outbreak. 04/08/14   Brunetta Jeans, PA-C  Green Tea, Camillia sinensis, (GREEN TEA EXTRACT PO) Take 1 capsule by mouth daily.    Historical Provider, MD  meperidine (DEMEROL) 50 MG tablet Take 1 tablet (50 mg total) by mouth every 4 (four) hours as needed. 05/03/14   Bronson Ing, DPM  omega-3 acid ethyl esters (LOVAZA) 1 G capsule Take 1 g by mouth daily.    Historical Provider, MD  OVER THE COUNTER MEDICATION Hyloronic Acid- 2 capsules daily    Historical Provider, MD  promethazine (PHENERGAN) 25 MG tablet Take 1 tablet (25 mg total) by mouth every 6 (six) hours as needed for nausea or vomiting. 05/03/14   Bronson Ing, DPM  VIVELLE-DOT 0.1 MG/24HR Place 1 patch onto the skin every 3 (three) days.  06/01/11   Historical Provider, MD   BP 107/54  Pulse 96  Temp(Src) 98 F (36.7  C) (Oral)  Resp 28  Ht 5\' 6"  (1.676 m)  Wt 130 lb (58.968 kg)  BMI 20.99 kg/m2  SpO2 98% Physical Exam  Nursing note and vitals reviewed. Constitutional: She appears well-developed and well-nourished. She appears distressed.  Appears uncomfortable, anxious  HENT:  Head: Normocephalic and atraumatic.  Eyes: Conjunctivae are normal. Pupils are equal, round, and reactive to light.  Neck: Neck supple. No tracheal deviation present. No thyromegaly present.  Cardiovascular: Normal rate and regular rhythm.   No murmur heard. Pulmonary/Chest: Effort normal.  Abdominal: Bowel sounds are normal. She exhibits no distension.  Midline surgical scar  Musculoskeletal: Normal range of motion. She exhibits tenderness. She exhibits no edema.  Right lower extremity: 20 cm flap laceration margins the wound grossly dirty at the medial aspect of calf, with corresponding tenderness. No active bleeding subcutaneous fat at base area and no soft tissue swelling. DP pulse 2+ all other extremities without contusion abrasion or tenderness neurovascularly intact  Neurological: She is alert. Coordination normal.  Skin: Skin is warm and dry. No rash noted.  Psychiatric: She has a normal mood and affect.    ED Course  Procedures (including critical care time) Labs Review Labs Reviewed - No data to display  Imaging Review No results found.   EKG Interpretation None     LACERATION REPAIR Performed by: Orlie Dakin Authorized by: Orlie Dakin Consent: Verbal consent obtained. Risks and benefits: risks, benefits and alternatives were discussed Consent given by: patient Patient identity confirmed: provided demographic data Prepped and Draped in normal sterile fashion Wound explored  Laceration Location: right calf  Laceration Length: 20cm  No Foreign Bodies seen or palpted  Anesthesia: local infiltration  Local anesthetic: lidocaine 2% no epinephrine  Anesthetic total: 4 ml  Irrigation  method: syringe Amount of cleaning: standard  Skin closure: surgical staples      Patient tolerance: Patient tolerated the procedure well with no immediate complications.  MDM   Final diagnoses:  None  pt curent on tetanus immunization I light of splenectomy and dirty wound present on arrival will rx antibiotic.plan rx keflex, norco, phenergan wound check 3 days staples out 7 days  Dx laceration of right calf    Orlie Dakin, MD 06/08/14 1206

## 2014-06-08 NOTE — ED Notes (Signed)
Pt wound covered in bacitracin and nonadherent bandage and covered with roller gauze

## 2014-06-08 NOTE — Discharge Instructions (Signed)
Wound Check Keep your scheduled appointment with your physician and get your wound rechecked in 3 days. Staples to come out in 7 days. Take Tylenol for mild pain or the pain medicine prescribed for bad pain Your wound appears healthy today. Your wound will heal gradually over time. Eventually a scar will form that will fade with time. FACTORS THAT AFFECT SCAR FORMATION:  People differ in the severity in which they scar.  Scar severity varies according to location, size, and the traits you inherited from your parents (genetic predisposition).  Irritation to the wound from infection, rubbing, or chemical exposure will increase the amount of scar formation. HOME CARE INSTRUCTIONS   If you were given a dressing, you should change it at least once a day or as instructed by your caregiver. If the bandage sticks, soak it off with a solution of hydrogen peroxide.  If the bandage becomes wet, dirty, or develops a bad smell, change it as soon as possible.  Look for signs of infection.  Only take over-the-counter or prescription medicines for pain, discomfort, or fever as directed by your caregiver. SEEK IMMEDIATE MEDICAL CARE IF:   You have redness, swelling, or increasing pain in the wound.  You notice pus coming from the wound.  You have a fever.  You notice a bad smell coming from the wound or dressing. Document Released: 06/19/2004 Document Revised: 12/06/2011 Document Reviewed: 09/13/2005 [facility] Patient Information 2015 [place], [place]. This information is not intended to replace advice given to you by your health care provider. Make sure you discuss any questions you have with your health care provider. Please go to your Primary Care Physician, an Urgent Care or return to the Emergency Department to have your staples or sutures removed 7 -10 days from today.

## 2014-06-11 ENCOUNTER — Encounter: Payer: Self-pay | Admitting: Family Medicine

## 2014-06-11 ENCOUNTER — Ambulatory Visit (INDEPENDENT_AMBULATORY_CARE_PROVIDER_SITE_OTHER): Payer: 59 | Admitting: Family Medicine

## 2014-06-11 VITALS — BP 105/73 | HR 65 | Temp 98.4°F | Ht 66.0 in | Wt 133.6 lb

## 2014-06-11 DIAGNOSIS — R0602 Shortness of breath: Secondary | ICD-10-CM

## 2014-06-11 DIAGNOSIS — R5383 Other fatigue: Secondary | ICD-10-CM

## 2014-06-11 DIAGNOSIS — R1084 Generalized abdominal pain: Secondary | ICD-10-CM

## 2014-06-11 DIAGNOSIS — K589 Irritable bowel syndrome without diarrhea: Secondary | ICD-10-CM

## 2014-06-11 DIAGNOSIS — L03115 Cellulitis of right lower limb: Secondary | ICD-10-CM

## 2014-06-11 DIAGNOSIS — R5381 Other malaise: Secondary | ICD-10-CM

## 2014-06-11 DIAGNOSIS — Z5189 Encounter for other specified aftercare: Secondary | ICD-10-CM

## 2014-06-11 DIAGNOSIS — Z23 Encounter for immunization: Secondary | ICD-10-CM

## 2014-06-11 DIAGNOSIS — R110 Nausea: Secondary | ICD-10-CM

## 2014-06-11 DIAGNOSIS — Q8909 Congenital malformations of spleen: Secondary | ICD-10-CM

## 2014-06-11 DIAGNOSIS — Q8901 Asplenia (congenital): Secondary | ICD-10-CM

## 2014-06-11 DIAGNOSIS — T148XXA Other injury of unspecified body region, initial encounter: Secondary | ICD-10-CM

## 2014-06-11 MED ORDER — CEPHALEXIN 500 MG PO CAPS
500.0000 mg | ORAL_CAPSULE | Freq: Three times a day (TID) | ORAL | Status: DC
Start: 2014-06-11 — End: 2014-07-22

## 2014-06-11 MED ORDER — ALBUTEROL SULFATE HFA 108 (90 BASE) MCG/ACT IN AERS: 2.0000 | INHALATION_SPRAY | Freq: Four times a day (QID) | RESPIRATORY_TRACT | Status: DC | PRN

## 2014-06-11 MED ORDER — ONDANSETRON HCL 4 MG PO TABS: 4.0000 mg | ORAL_TABLET | Freq: Three times a day (TID) | ORAL | Status: DC | PRN

## 2014-06-11 NOTE — Patient Instructions (Signed)
Probiotic daily Digestive Advantage or Phillip's Colon Health Biotin and 50 mg of Zinc daily for the skin

## 2014-06-11 NOTE — Progress Notes (Signed)
Pre visit review using our clinic review tool, if applicable. No additional management support is needed unless otherwise documented below in the visit note. 

## 2014-06-14 ENCOUNTER — Encounter: Payer: Self-pay | Admitting: Medical

## 2014-06-14 ENCOUNTER — Ambulatory Visit (INDEPENDENT_AMBULATORY_CARE_PROVIDER_SITE_OTHER): Payer: 59 | Admitting: Medical

## 2014-06-14 VITALS — BP 112/75 | HR 64 | Temp 98.4°F | Ht 64.75 in | Wt 133.6 lb

## 2014-06-14 DIAGNOSIS — L03115 Cellulitis of right lower limb: Secondary | ICD-10-CM

## 2014-06-14 DIAGNOSIS — L0390 Cellulitis, unspecified: Secondary | ICD-10-CM | POA: Insufficient documentation

## 2014-06-14 DIAGNOSIS — L02419 Cutaneous abscess of limb, unspecified: Secondary | ICD-10-CM

## 2014-06-14 DIAGNOSIS — L03119 Cellulitis of unspecified part of limb: Secondary | ICD-10-CM

## 2014-06-14 MED ORDER — CEFTRIAXONE SODIUM 1 G IJ SOLR
1.0000 g | Freq: Once | INTRAMUSCULAR | Status: AC
  Administered 2014-06-14: 1 g via INTRAMUSCULAR

## 2014-06-14 MED ORDER — CEFTRIAXONE SODIUM 1 G IJ SOLR: 1.0000 g | Freq: Once | INTRAMUSCULAR | Status: DC

## 2014-06-14 MED ORDER — HYDROCODONE-ACETAMINOPHEN 5-325 MG PO TABS: 1.0000 | ORAL_TABLET | Freq: Four times a day (QID) | ORAL | Status: DC | PRN

## 2014-06-14 MED ORDER — SULFAMETHOXAZOLE-TMP DS 800-160 MG PO TABS: 1.0000 | ORAL_TABLET | Freq: Two times a day (BID) | ORAL | Status: DC

## 2014-06-14 MED ORDER — FLUCONAZOLE 150 MG PO TABS: 150.0000 mg | ORAL_TABLET | Freq: Once | ORAL | Status: DC

## 2014-06-14 MED ORDER — ONDANSETRON 8 MG PO TBDP: 8.0000 mg | ORAL_TABLET | Freq: Three times a day (TID) | ORAL | Status: DC | PRN

## 2014-06-14 NOTE — Progress Notes (Signed)
   Subjective:    Patient ID: Annette Snow, female    DOB: 26-Sep-1961, 53 y.o.   MRN: [id number]  HPI  Pt in with rt calf laceration that is healing. She states bike pedal laceration on Saturday. Pt was seen on 15th by Dr. Randel Pigg. The area looked better the other day per pt. Dr. Randel Pigg gave rx to take in the event she needs to take. She wrote the rx for keflex.Pt has not taken. She has no history of mrsa. 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 No fever, no chills and no sweating.     Review of Systems  Constitutional: Negative for fever, chills and fatigue.  Respiratory: Negative for cough, shortness of breath and wheezing.   Cardiovascular: Negative for chest pain and palpitations.  Musculoskeletal:       Tenderness to over laceration line rt calf. Redness and discolored.  Skin:       Redness to laceration line/wound. Area is increasingly tender.  Hematological: Negative for adenopathy.       Objective:   Physical Exam  General- no acute distress, pleasant pt.  Rt calf-  Half circle haped laceration laceration mid posterior calf. She has staple in place. Redness and tenderness along 2/3 of laceration line. The remaining 1/3 rd of laceration line looks bruised.Small dry yellow dc mid aspect from which I took culture.       Assessment & Plan:

## 2014-06-14 NOTE — Patient Instructions (Addendum)
You appear to have cellulitis on the laceration line. We gave you rocephin Im today and I am sending bactrim ds to your pharmacy. Culture of the wound is pending. I am writing a limited number of hydrocodone for pain this weekend. Follow up with a provider in [place] on Monday or Tuesday this coming week since you are heading out of town. It is too early to remove staples today. Infection can delay healing and if I  remove staples today I think wound would open up. Provider in [place] may be able to remove staples on Monday or Tuesday. You can call on Monday or Tuesday culture results may be in. Follow up with [place] when you are back in town.  Zofran for pt nausea when takes hydrocodone.  Diflucan rx if gets yeast infection.

## 2014-06-14 NOTE — Assessment & Plan Note (Signed)
Appears to have infection. Pt showed me features of wound before repair. Concern that there may be space underneath flap. The area does look infected. Wound culture pending. Leaned toward more aggressive treatment since pt  leaving out of town tomorrow. Gave rocpehin im and bactrim ds.(Provide for some coverage of community aquired mrsa. )Pt is aware she needs to follow up with provider in [employer]. If she gets expanding redness over weekend then see someone Saturday or Sunday.

## 2014-06-16 ENCOUNTER — Encounter: Payer: Self-pay | Admitting: Family Medicine

## 2014-06-16 DIAGNOSIS — R110 Nausea: Secondary | ICD-10-CM | POA: Insufficient documentation

## 2014-06-16 DIAGNOSIS — R5383 Other fatigue: Secondary | ICD-10-CM | POA: Insufficient documentation

## 2014-06-16 NOTE — Progress Notes (Signed)
Patient ID: Annette Snow, female   DOB: Aug 25, 1961, 53 y.o.   MRN: [id number] Annette Snow [id number] [birth year] 06/16/2014      Progress Note-Follow Up  Subjective  Chief Complaint  Chief Complaint  Patient presents with  . bruising  . Fatigue  . Injections    flu    HPI  Patient is a 53 year old female in today for routine medical care. She continues to struggle with abdominal pain although she has responded well to some scar reduction on her abdomen. Has intermittent trouble with abdominal pain constipation diarrhea. Struggles with nausea but no significant vomiting. Has injured her right outdoor bike riding. It is slowly improving. Denies CP/palp/SOB/HA/congestion/fevers/GI or GU c/o. Taking meds as prescribed  Past Medical History  Diagnosis Date  . Ulcer 1984    petic and duodenal  . IBS (irritable bowel syndrome)   . Migraines   . Hemorrhoids   . Blood transfusion   . 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 Neuropathy, sacral plexus 09/13/2011  . Osgood-Schlatter's disease 09/13/2011  . Endometriosis 09/13/2011  . Abdominal pain 09/13/2011  . Foot pain, right 09/13/2011  . Adenomatous colon polyp   . IBS (irritable bowel syndrome)   . ECG abnormal 11/19/2011    Past Surgical History  Procedure Laterality Date  . Laparoscopic hysterectomy  2000    total for extensive endometriosis  . Bunionectomy      bilateral  . Ankle reconstruction      left @ age 3  . Knee arthroscopy      bilateral  3 on left 2 on right  . Breast enhancement surgery    . Wrist reconstruction      left twice  . Foot surgery      left twice  . Tonsillectomy and adenoidectomy    . Laparoscopy      multiple times for endometriosis on all organs  . Abdominal hysterectomy    . 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 Metatarsal osteotomy  11/24/2011    Procedure: METATARSAL OSTEOTOMY;  Surgeon: Wallene Huh, DPM;  Location: [facility];  Service: Podiatry;  Laterality: Right;  RIGHT AIKENS  OSTEOTOMY; RIGHT METATARSAL OSTETOMY Second & Third WITH SCREW; Right Second arthroplasty lesser metatarsalphalangeal with total implant, FUSION Third TOE RIGHT  . Eye surgery  01/23/2012    cosmetic lift, eyelids, b/l    Family History  Problem Relation Age of Onset  . Alzheimer's disease Maternal Grandmother   . Heart attack Maternal Grandfather   . Leukemia Paternal Grandfather   . Liver disease Sister   . Cirrhosis Sister     primary biliary cirrhosis  . Heart disease Father     rheumatic heart disease    History   Social History  . Marital Status: Single    Spouse Name: N/A    Number of Children: N/A  . Years of Education: N/A   Occupational History  . Not on file.   Social History Main Topics  . Smoking status: Former Smoker -- 0.00 packs/day    Quit date: 11/16/2008  . Smokeless tobacco: Never Used  . Alcohol Use: 3.6 oz/week    6 Cans of beer per week  . Drug Use: Yes    Special: Marijuana     Comment: for pain  . Sexual Activity: No   Other Topics Concern  . Not on file   Social History Narrative  . No narrative on file    Current Outpatient Prescriptions on File Prior  to Visit  Medication Sig Dispense Refill  . famciclovir (FAMVIR) 500 MG tablet Take 3 tablets at onset of symptoms.  Do not take more that 3 tablets per outbreak.  15 tablet  0  . Green Tea, Camillia sinensis, (GREEN TEA EXTRACT PO) Take 1 capsule by mouth daily.      . meperidine (DEMEROL) 50 MG tablet Take 1 tablet (50 mg total) by mouth every 4 (four) hours as needed.  30 tablet  0  . omega-3 acid ethyl esters (LOVAZA) 1 G capsule Take 1 g by mouth daily.      Marland Kitchen OVER THE COUNTER MEDICATION Hyloronic Acid- 2 capsules daily      . promethazine (PHENERGAN) 25 MG tablet Take 1 tablet (25 mg total) by mouth every 6 (six) hours as needed for nausea or vomiting.  15 tablet  0  . VIVELLE-DOT 0.1 MG/24HR Place 1 patch onto the skin every 3 (three) days.        No current facility-administered  medications on file prior to visit.    Allergies  Allergen Reactions  . Adhesive [Tape]   . Demerol [Meperidine]   . Nucynta [Tapentadol Hydrochloride] Itching    Review of Systems  Review of Systems  Constitutional: Negative for fever and malaise/fatigue.  HENT: Negative for congestion.   Eyes: Negative for discharge.  Respiratory: Negative for shortness of breath.   Cardiovascular: Negative for chest pain, palpitations and leg swelling.  Gastrointestinal: Positive for abdominal pain. Negative for nausea and diarrhea.  Genitourinary: Negative for dysuria.  Musculoskeletal: Negative for falls.  Skin: Negative for rash.  Neurological: Negative for loss of consciousness and headaches.  Endo/Heme/Allergies: Negative for polydipsia.  Psychiatric/Behavioral: Negative for depression and suicidal ideas. The patient is not nervous/anxious and does not have insomnia.     Objective  BP 105/73  Pulse 65  Temp(Src) 98.4 F (36.9 C) (Oral)  Ht 5\' 6"  (1.676 m)  Wt 133 lb 9.6 oz (60.601 kg)  BMI 21.57 kg/m2  SpO2 100%  Physical Exam  Physical Exam  Constitutional: She is oriented to person, place, and time and well-developed, well-nourished, and in no distress. No distress.  HENT:  Head: Normocephalic and atraumatic.  Eyes: Conjunctivae are normal.  Neck: Neck supple. No thyromegaly present.  Cardiovascular: Normal rate, regular rhythm and normal heart sounds.   No murmur heard. Pulmonary/Chest: Effort normal and breath sounds normal. She has no wheezes.  Abdominal: She exhibits no distension and no mass.  Scarring on abdominal less impressive  Musculoskeletal: She exhibits no edema.  Lymphadenopathy:    She has no cervical adenopathy.  Neurological: She is alert and oriented to person, place, and time.  Skin: Skin is warm and dry. No rash noted. She is not diaphoretic.  Psychiatric: Memory, affect and judgment normal.    Lab Results  Component Value Date   TSH 1.13  01/24/2013   Lab Results  Component Value Date   WBC 5.7 01/24/2013   HGB 13.5 01/24/2013   HCT 40.3 01/24/2013   MCV 95.6 01/24/2013   PLT 223.0 01/24/2013   Lab Results  Component Value Date   CREATININE 0.7 01/24/2013   BUN 11 01/24/2013   NA 138 01/24/2013   K 4.4 01/24/2013   CL 103 01/24/2013   CO2 30 01/24/2013   Lab Results  Component Value Date   ALT 21 01/24/2013   AST 27 01/24/2013   ALKPHOS 44 01/24/2013   BILITOT 1.1 01/24/2013   Lab Results  Component Value  Date   CHOL 194 01/24/2013   Lab Results  Component Value Date   HDL 98.10 01/24/2013   Lab Results  Component Value Date   LDLCALC 86 01/24/2013   Lab Results  Component Value Date   TRIG 50.0 01/24/2013   Lab Results  Component Value Date   CHOLHDL 2 01/24/2013     Assessment & Plan  Abdominal pain Has had extensive work done on her scars on her abdomen and this has been somewhat helpful  IBS (irritable bowel syndrome) Encouraged ongoing healthy diet, probiotics and fiber.  Spleen absent Bruising easier but no concerning bleeding, gums etc.  Nausea alone Consider Curcumen, ginger, given refill on Ondansetron

## 2014-06-16 NOTE — Assessment & Plan Note (Signed)
Has had extensive work done on her scars on her abdomen and this has been somewhat helpful

## 2014-06-16 NOTE — Assessment & Plan Note (Signed)
Consider Curcumen, ginger, given refill on Ondansetron

## 2014-06-16 NOTE — Assessment & Plan Note (Signed)
Encouraged ongoing healthy diet, probiotics and fiber.

## 2014-06-16 NOTE — Assessment & Plan Note (Signed)
Bruising easier but no concerning bleeding, gums etc.

## 2014-06-18 ENCOUNTER — Telehealth: Payer: Self-pay | Admitting: Family Medicine

## 2014-06-18 NOTE — Telephone Encounter (Signed)
Caller name: Rashay Relation to pt: self Call back number: [phone] Pharmacy:  Reason for call:   Patient is requesting culture results from visit with Percell Miller

## 2014-06-19 NOTE — Telephone Encounter (Signed)
Annette Snow please advise.  There is an order in the system waiting for wound culture collection but nothing that shows it ever was.

## 2014-06-19 NOTE — Telephone Encounter (Signed)
I called pt and left message explaining culture results. I want her to call me back tomorrow and give me update on how wound looks and if she had provider in [place] remove staples.

## 2014-06-20 ENCOUNTER — Telehealth: Payer: Self-pay

## 2014-06-20 NOTE — Telephone Encounter (Signed)
Annette Snow Self [phone]  Arleen returned your call, she is in [place], she stated felling little better, and also had staples taking out while there.

## 2014-06-24 ENCOUNTER — Encounter: Payer: PRIVATE HEALTH INSURANCE | Admitting: Podiatry

## 2014-07-03 ENCOUNTER — Ambulatory Visit (INDEPENDENT_AMBULATORY_CARE_PROVIDER_SITE_OTHER): Payer: PRIVATE HEALTH INSURANCE

## 2014-07-03 ENCOUNTER — Ambulatory Visit (INDEPENDENT_AMBULATORY_CARE_PROVIDER_SITE_OTHER): Payer: PRIVATE HEALTH INSURANCE | Admitting: Podiatry

## 2014-07-03 ENCOUNTER — Encounter: Payer: Self-pay | Admitting: Podiatry

## 2014-07-03 VITALS — BP 100/68 | HR 68 | Resp 16

## 2014-07-03 DIAGNOSIS — M779 Enthesopathy, unspecified: Secondary | ICD-10-CM

## 2014-07-03 DIAGNOSIS — M2011 Hallux valgus (acquired), right foot: Secondary | ICD-10-CM

## 2014-07-03 DIAGNOSIS — M21611 Bunion of right foot: Secondary | ICD-10-CM

## 2014-07-03 DIAGNOSIS — M2042 Other hammer toe(s) (acquired), left foot: Secondary | ICD-10-CM

## 2014-07-03 MED ORDER — TRIAMCINOLONE ACETONIDE 10 MG/ML IJ SUSP
10.0000 mg | Freq: Once | INTRAMUSCULAR | Status: AC
  Administered 2014-07-03: 10 mg

## 2014-07-03 NOTE — Progress Notes (Signed)
Subjective:     Patient ID: Annette Snow, female   DOB: 04-16-1961, 53 y.o.   MRN: [id number]  HPI patient presents stating I'm doing pretty good with the area that had surgery but I have pain more proximal and a my left foot I know him to need to have that second toe straightened and the joint fixed because it's been so sore   Review of Systems     Objective:   Physical Exam Neurovascular status intact with well-healing surgical site right fifth metatarsal with inflammation and pain at the peroneal insertion fifth metatarsal right and discomfort and inflammation second metatarsophalangeal joint left    Assessment:     Peroneal tendinitis right secondary to change in gait and inflammatory capsulitis second MPJ left foot    Plan:     H&P and x-ray reviewed. Careful injection minister right 3 mg Kenalog 5 mg Xylocaine and discussed digital surgery for the left along with metatarsal osteotomy. Patient wants procedure but needs to hold off and will be seen back as needed

## 2014-07-22 ENCOUNTER — Encounter: Payer: Self-pay | Admitting: Family Medicine

## 2014-07-22 ENCOUNTER — Ambulatory Visit (INDEPENDENT_AMBULATORY_CARE_PROVIDER_SITE_OTHER): Payer: 59 | Admitting: Family Medicine

## 2014-07-22 VITALS — BP 108/73 | HR 70 | Temp 98.4°F | Ht 66.0 in | Wt 133.0 lb

## 2014-07-22 DIAGNOSIS — R5383 Other fatigue: Secondary | ICD-10-CM

## 2014-07-22 DIAGNOSIS — R112 Nausea with vomiting, unspecified: Secondary | ICD-10-CM

## 2014-07-22 DIAGNOSIS — L03119 Cellulitis of unspecified part of limb: Secondary | ICD-10-CM

## 2014-07-22 DIAGNOSIS — R233 Spontaneous ecchymoses: Secondary | ICD-10-CM

## 2014-07-22 DIAGNOSIS — R1084 Generalized abdominal pain: Secondary | ICD-10-CM

## 2014-07-22 DIAGNOSIS — R238 Other skin changes: Secondary | ICD-10-CM

## 2014-07-22 LAB — CBC
HCT: 40.6 % (ref 36.0–46.0)
Hemoglobin: 13.6 g/dL (ref 12.0–15.0)
MCHC: 33.4 g/dL (ref 30.0–36.0)
MCV: 95.5 fl (ref 78.0–100.0)
Platelets: 244 10*3/uL (ref 150.0–400.0)
RBC: 4.25 Mil/uL (ref 3.87–5.11)
RDW: 13.5 % (ref 11.5–15.5)
WBC: 6.5 10*3/uL (ref 4.0–10.5)

## 2014-07-22 LAB — HEPATIC FUNCTION PANEL
ALT: 14 U/L (ref 0–35)
AST: 19 U/L (ref 0–37)
Albumin: 3.5 g/dL (ref 3.5–5.2)
Alkaline Phosphatase: 47 U/L (ref 39–117)
Bilirubin, Direct: 0.1 mg/dL (ref 0.0–0.3)
TOTAL PROTEIN: 6.9 g/dL (ref 6.0–8.3)
Total Bilirubin: 0.8 mg/dL (ref 0.2–1.2)

## 2014-07-22 LAB — RENAL FUNCTION PANEL
ALBUMIN: 3.5 g/dL (ref 3.5–5.2)
BUN: 15 mg/dL (ref 6–23)
CALCIUM: 9.1 mg/dL (ref 8.4–10.5)
CO2: 29 mEq/L (ref 19–32)
CREATININE: 0.9 mg/dL (ref 0.4–1.2)
Chloride: 100 mEq/L (ref 96–112)
GFR: 71.72 mL/min (ref >60.00)
GLUCOSE: 82 mg/dL (ref 70–99)
Phosphorus: 2.9 mg/dL (ref 2.3–4.6)
Potassium: 4.2 mEq/L (ref 3.5–5.1)
Sodium: 136 mEq/L (ref 135–145)

## 2014-07-22 LAB — SEDIMENTATION RATE: SED RATE: 8 mm/h (ref 0–22)

## 2014-07-22 LAB — TSH: TSH: 0.47 u[IU]/mL (ref 0.35–4.50)

## 2014-07-22 NOTE — Progress Notes (Signed)
Pre visit review using our clinic review tool, if applicable. No additional management support is needed unless otherwise documented below in the visit note. 

## 2014-07-22 NOTE — Patient Instructions (Signed)
Biotin, collagen supplements

## 2014-07-24 LAB — ZINC: Zinc: 76 ug/dL (ref 60–130)

## 2014-07-25 ENCOUNTER — Other Ambulatory Visit (INDEPENDENT_AMBULATORY_CARE_PROVIDER_SITE_OTHER): Payer: 59

## 2014-07-25 DIAGNOSIS — R238 Other skin changes: Secondary | ICD-10-CM

## 2014-07-25 DIAGNOSIS — R233 Spontaneous ecchymoses: Secondary | ICD-10-CM

## 2014-07-25 LAB — PROTIME-INR
INR: 0.9 ratio (ref 0.8–1.0)
PROTHROMBIN TIME: 10.5 s (ref 9.6–13.1)

## 2014-07-28 DIAGNOSIS — R238 Other skin changes: Secondary | ICD-10-CM | POA: Insufficient documentation

## 2014-07-28 DIAGNOSIS — R233 Spontaneous ecchymoses: Secondary | ICD-10-CM | POA: Insufficient documentation

## 2014-07-28 DIAGNOSIS — R110 Nausea: Secondary | ICD-10-CM | POA: Insufficient documentation

## 2014-07-28 HISTORY — DX: Nausea: R11.0

## 2014-07-28 NOTE — Progress Notes (Signed)
Annette Snow [id number] Feb 22, 1961 07/28/2014      Progress Note-Follow Up  Subjective  Chief Complaint  Chief Complaint  Patient presents with  . Follow-up    6 week    HPI  Patient is a 53 year old female in today for routine medical care. She is in today with numerous concerns but her greatest concern is her persistent malaise and fatigue s/p numerous surgeries and illness and worsening bruising. No other bleeding such as gums or stool. Has intermittent abdominal pain and n/v still. No fever. Her right leg lesion/cellulitis is resolved. Denies CP/palp/SOB/HA/congestion/fevers or GU c/o. Taking meds as prescribed  Past Medical History  Diagnosis Date  . Ulcer 1984    petic and duodenal  . IBS (irritable bowel syndrome)   . Migraines   . Hemorrhoids   . Blood transfusion   . 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 Neuropathy, sacral plexus 09/13/2011  . Osgood-Schlatter's disease 09/13/2011  . Endometriosis 09/13/2011  . Abdominal pain 09/13/2011  . Foot pain, right 09/13/2011  . Adenomatous colon polyp   . IBS (irritable bowel syndrome)   . ECG abnormal 11/19/2011    Past Surgical History  Procedure Laterality Date  . Laparoscopic hysterectomy  2000    total for extensive endometriosis  . Bunionectomy      bilateral  . Ankle reconstruction      left @ age 85  . Knee arthroscopy      bilateral  3 on left 2 on right  . Breast enhancement surgery    . Wrist reconstruction      left twice  . Foot surgery      left twice  . Tonsillectomy and adenoidectomy    . Laparoscopy      multiple times for endometriosis on all organs  . Abdominal hysterectomy    . 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 Metatarsal osteotomy  11/24/2011    Procedure: METATARSAL OSTEOTOMY;  Surgeon: Wallene Huh, DPM;  Location: [facility];  Service: Podiatry;  Laterality: Right;  RIGHT AIKENS OSTEOTOMY; RIGHT METATARSAL OSTETOMY Second & Third WITH SCREW; Right Second arthroplasty lesser  metatarsalphalangeal with total implant, FUSION Third TOE RIGHT  . Eye surgery  01/23/2012    cosmetic lift, eyelids, b/l    Family History  Problem Relation Age of Onset  . Alzheimer's disease Maternal Grandmother   . Heart attack Maternal Grandfather   . Leukemia Paternal Grandfather   . Liver disease Sister   . Cirrhosis Sister     primary biliary cirrhosis  . Heart disease Father     rheumatic heart disease    History   Social History  . Marital Status: Single    Spouse Name: N/A    Number of Children: N/A  . Years of Education: N/A   Occupational History  . Not on file.   Social History Main Topics  . Smoking status: Former Smoker -- 0.00 packs/day    Quit date: 11/16/2008  . Smokeless tobacco: Never Used  . Alcohol Use: 3.6 oz/week    6 Cans of beer per week  . Drug Use: Yes    Special: Marijuana     Comment: for pain  . Sexual Activity: No   Other Topics Concern  . Not on file   Social History Narrative  . No narrative on file    Current Outpatient Prescriptions on File Prior to Visit  Medication Sig Dispense Refill  . albuterol (PROVENTIL HFA;VENTOLIN HFA) 108 (90 BASE) MCG/ACT inhaler  Inhale 2 puffs into the lungs every 6 (six) hours as needed for wheezing or shortness of breath. 1 Inhaler 1  . famciclovir (FAMVIR) 500 MG tablet Take 3 tablets at onset of symptoms.  Do not take more that 3 tablets per outbreak. 15 tablet 0  . Green Tea, Camillia sinensis, (GREEN TEA EXTRACT PO) Take 1 capsule by mouth daily.    Marland Kitchen omega-3 acid ethyl esters (LOVAZA) 1 G capsule Take 1 g by mouth daily.    Marland Kitchen OVER THE COUNTER MEDICATION Hyloronic Acid- 2 capsules daily    . VIVELLE-DOT 0.1 MG/24HR Place 1 patch onto the skin every 3 (three) days.      No current facility-administered medications on file prior to visit.    Allergies  Allergen Reactions  . Adhesive [Tape]   . Demerol [Meperidine]   . Nucynta [Tapentadol Hydrochloride] Itching    Review of  Systems Review of Systems  Constitutional: Positive for malaise/fatigue. Negative for fever.  HENT: Negative for congestion.   Eyes: Negative for discharge.  Respiratory: Negative for shortness of breath.   Cardiovascular: Negative for chest pain, palpitations and leg swelling.  Gastrointestinal: Positive for nausea, vomiting, abdominal pain and constipation. Negative for diarrhea.  Genitourinary: Negative for dysuria.  Musculoskeletal: Negative for falls.  Skin: Negative for rash.  Neurological: Negative for loss of consciousness and headaches.  Endo/Heme/Allergies: Negative for polydipsia.  Psychiatric/Behavioral: Negative for depression and suicidal ideas. The patient is not nervous/anxious and does not have insomnia.     Objective  BP 108/73 mmHg  Pulse 70  Temp(Src) 98.4 F (36.9 C) (Oral)  Ht 5\' 6"  (1.676 m)  Wt 133 lb (60.328 kg)  BMI 21.48 kg/m2  SpO2 99%  Physical Exam  Physical Exam  Constitutional: She is oriented to person, place, and time and well-developed, well-nourished, and in no distress. No distress.  HENT:  Head: Normocephalic and atraumatic.  Eyes: Conjunctivae are normal.  Neck: Neck supple. No thyromegaly present.  Cardiovascular: Normal rate, regular rhythm and normal heart sounds.   No murmur heard. Pulmonary/Chest: Effort normal and breath sounds normal. She has no wheezes.  Abdominal: Soft. Bowel sounds are normal. She exhibits no distension and no mass.  Significant surgical scar along abdominal midline with minimal hypertrophy  Musculoskeletal: She exhibits no edema.  Lymphadenopathy:    She has no cervical adenopathy.  Neurological: She is alert and oriented to person, place, and time.  Skin: Skin is warm and dry. No rash noted. She is not diaphoretic.  Psychiatric: Memory, affect and judgment normal.    Lab Results  Component Value Date   TSH 0.47 07/22/2014   Lab Results  Component Value Date   WBC 6.5 07/22/2014   HGB 13.6  07/22/2014   HCT 40.6 07/22/2014   MCV 95.5 07/22/2014   PLT 244.0 07/22/2014   Lab Results  Component Value Date   CREATININE 0.9 07/22/2014   BUN 15 07/22/2014   NA 136 07/22/2014   K 4.2 07/22/2014   CL 100 07/22/2014   CO2 29 07/22/2014   Lab Results  Component Value Date   ALT 14 07/22/2014   AST 19 07/22/2014   ALKPHOS 47 07/22/2014   BILITOT 0.8 07/22/2014   Lab Results  Component Value Date   CHOL 194 01/24/2013   Lab Results  Component Value Date   HDL 98.10 01/24/2013   Lab Results  Component Value Date   LDLCALC 86 01/24/2013   Lab Results  Component Value Date  TRIG 50.0 01/24/2013   Lab Results  Component Value Date   CHOLHDL 2 01/24/2013     Assessment & Plan  Fatigue Patient very frustrated with persistent fatigue and weakness as she recovers from her numerous medical concerns. Offered reassurance that she is doing all the right things trying to eat well, exercise and rest appropriately. Labs to investigate are unremarkable. She struggles with a degree of depression and anxiety as a result and 25 minutes of a 30 minute visit is spent in counseling  Abnormal bruising Has worsened recently and no bleeding noted. Patient will notify us if worsens  Nausea with vomiting Avoid offending foods, ginger prn, may continue with Zofran and Phenergan prn  Abdominal pain Secondary to numerous surgeries. Has had good results from recent surgery but pain continues, will follow up with surgery.  Cellulitis Right leg healed after much treatment

## 2014-07-28 NOTE — Assessment & Plan Note (Signed)
Secondary to numerous surgeries. Has had good results from recent surgery but pain continues, will follow up with surgery.

## 2014-07-28 NOTE — Assessment & Plan Note (Signed)
Patient very frustrated with persistent fatigue and weakness as she recovers from her numerous medical concerns. Offered reassurance that she is doing all the right things trying to eat well, exercise and rest appropriately. Labs to investigate are unremarkable. She struggles with a degree of depression and anxiety as a result and 25 minutes of a 30 minute visit is spent in counseling

## 2014-07-28 NOTE — Assessment & Plan Note (Signed)
Avoid offending foods, ginger prn, may continue with Zofran and Phenergan prn

## 2014-07-28 NOTE — Assessment & Plan Note (Signed)
Has worsened recently and no bleeding noted. Patient will notify us if worsens

## 2014-07-28 NOTE — Assessment & Plan Note (Signed)
Right leg healed after much treatment

## 2014-09-17 ENCOUNTER — Encounter: Payer: Self-pay | Admitting: Family Medicine

## 2014-09-18 ENCOUNTER — Other Ambulatory Visit: Payer: Self-pay | Admitting: Family Medicine

## 2014-09-18 MED ORDER — CIPROFLOXACIN HCL 500 MG PO TABS: 500.0000 mg | ORAL_TABLET | Freq: Two times a day (BID) | ORAL | Status: DC

## 2014-10-01 ENCOUNTER — Telehealth: Payer: Self-pay | Admitting: Family Medicine

## 2014-10-01 NOTE — Telephone Encounter (Signed)
Make sure she is taking probiotics, zinc such as Coldeeze and drinking plenty of clear fluids. If wheeze cough is worsening can have a prescription for medrol dospak

## 2014-10-01 NOTE — Telephone Encounter (Signed)
Caller name: Amanat Relation to pt: self Call back number: [phone]  Pharmacy:  Reason for call:   Patient states that she has finished a round of antibiotics(that was previously prescribed for something else) but is still having chest congestion and wanted to know what else she should do? Patient states that she doesn't want to come in to be seen because she gets so sick easily. Offered appointment, patient declined.

## 2014-10-02 NOTE — Telephone Encounter (Signed)
Pt made aware.  Wheezing and cough better today.  Pt knows to call back if symptoms worsen for medrol dospak.

## 2014-10-04 ENCOUNTER — Telehealth: Payer: Self-pay | Admitting: Family Medicine

## 2014-10-04 ENCOUNTER — Encounter: Payer: Self-pay | Admitting: Family Medicine

## 2014-10-04 ENCOUNTER — Other Ambulatory Visit: Payer: Self-pay | Admitting: Family Medicine

## 2014-10-04 MED ORDER — METHYLPREDNISOLONE (PAK) 4 MG PO TABS: ORAL_TABLET | ORAL | Status: DC

## 2014-10-04 NOTE — Telephone Encounter (Signed)
Please call in Medrol- pt cough is worse.

## 2014-10-04 NOTE — Telephone Encounter (Signed)
Sorry you are struggling. This is a tough call could still be viral but could be a secondary bacterial infection by now. So hi grade fevers (>101), thick green sputum worsening symptoms, etc I say start an antibiotic, if less than that I wait a good 4-8 weeks before I consider an antibiotic for myself. Does that help? I have put a prescription for Ciprofloxacin on hold at the pharmacy for you to take if you need it. I think you do these things but remember to take a good probiotic (multiple strains in it), Mucinex 600 mg twice daily, Zinc 50 mg daily and some Elderberry these things help fight off infections especially viral one's. Hope that helps. Dr B    Message left to call the office      KP

## 2014-11-18 ENCOUNTER — Ambulatory Visit: Payer: 59 | Admitting: Family Medicine

## 2014-12-02 ENCOUNTER — Ambulatory Visit: Payer: 59 | Admitting: Family Medicine

## 2015-01-06 ENCOUNTER — Encounter: Payer: Self-pay | Admitting: Family Medicine

## 2015-01-06 ENCOUNTER — Ambulatory Visit (INDEPENDENT_AMBULATORY_CARE_PROVIDER_SITE_OTHER): Payer: 59 | Admitting: Family Medicine

## 2015-01-06 VITALS — BP 107/73 | HR 76 | Temp 98.6°F | Resp 18 | Ht 66.0 in | Wt 135.0 lb

## 2015-01-06 DIAGNOSIS — R110 Nausea: Secondary | ICD-10-CM

## 2015-01-06 DIAGNOSIS — K589 Irritable bowel syndrome without diarrhea: Secondary | ICD-10-CM | POA: Diagnosis not present

## 2015-01-06 DIAGNOSIS — T7840XS Allergy, unspecified, sequela: Secondary | ICD-10-CM | POA: Diagnosis not present

## 2015-01-06 DIAGNOSIS — N809 Endometriosis, unspecified: Secondary | ICD-10-CM

## 2015-01-06 MED ORDER — METHYLPREDNISOLONE (PAK) 4 MG PO TABS: ORAL_TABLET | ORAL | Status: DC

## 2015-01-06 MED ORDER — FLUTICASONE PROPIONATE 50 MCG/ACT NA SUSP: 2.0000 | Freq: Every day | NASAL | Status: DC | PRN

## 2015-01-06 MED ORDER — MONTELUKAST SODIUM 10 MG PO TABS: 10.0000 mg | ORAL_TABLET | Freq: Every day | ORAL | Status: DC

## 2015-01-06 NOTE — Patient Instructions (Addendum)
[employer], soothing nasal flush, [email or web address] Zyrtec twice daily    Allergies Allergies may happen from anything your body is sensitive to. This may be food, medicines, pollens, chemicals, and nearly anything around you in everyday life that produces allergens. An allergen is anything that causes an allergy producing substance. Heredity is often a factor in causing these problems. This means you may have some of the same allergies as your parents. Food allergies happen in all age groups. Food allergies are some of the most severe and life threatening. Some common food allergies are cow's milk, seafood, eggs, nuts, wheat, and soybeans. SYMPTOMS   Swelling around the mouth.  An itchy red rash or hives.  Vomiting or diarrhea.  Difficulty breathing. SEVERE ALLERGIC REACTIONS ARE LIFE-THREATENING. This reaction is called anaphylaxis. It can cause the mouth and throat to swell and cause difficulty with breathing and swallowing. In severe reactions only a trace amount of food (for example, peanut oil in a salad) may cause death within seconds. Seasonal allergies occur in all age groups. These are seasonal because they usually occur during the same season every year. They may be a reaction to molds, grass pollens, or tree pollens. Other causes of problems are house dust mite allergens, pet dander, and mold spores. The symptoms often consist of nasal congestion, a runny itchy nose associated with sneezing, and tearing itchy eyes. There is often an associated itching of the mouth and ears. The problems happen when you come in contact with pollens and other allergens. Allergens are the particles in the air that the body reacts to with an allergic reaction. This causes you to release allergic antibodies. Through a chain of events, these eventually cause you to release histamine into the blood stream. Although it is meant to be protective to the body, it is this release that causes your discomfort. This  is why you were given anti-histamines to feel better. If you are unable to pinpoint the offending allergen, it may be determined by skin or blood testing. Allergies cannot be cured but can be controlled with medicine. Hay fever is a collection of all or some of the seasonal allergy problems. It may often be treated with simple over-the-counter medicine such as diphenhydramine. Take medicine as directed. Do not drink alcohol or drive while taking this medicine. Check with your caregiver or package insert for child dosages. If these medicines are not effective, there are many new medicines your caregiver can prescribe. Stronger medicine such as nasal spray, eye drops, and corticosteroids may be used if the first things you try do not work well. Other treatments such as immunotherapy or desensitizing injections can be used if all else fails. Follow up with your caregiver if problems continue. These seasonal allergies are usually not life threatening. They are generally more of a nuisance that can often be handled using medicine. HOME CARE INSTRUCTIONS   If unsure what causes a reaction, keep a diary of foods eaten and symptoms that follow. Avoid foods that cause reactions.  If hives or rash are present:  Take medicine as directed.  You may use an over-the-counter antihistamine (diphenhydramine) for hives and itching as needed.  Apply cold compresses (cloths) to the skin or take baths in cool water. Avoid hot baths or showers. Heat will make a rash and itching worse.  If you are severely allergic:  Following a treatment for a severe reaction, hospitalization is often required for closer follow-up.  Wear a medic-alert bracelet or necklace stating the allergy.  You and your family must learn how to give adrenaline or use an anaphylaxis kit.  If you have had a severe reaction, always carry your anaphylaxis kit or EpiPen with you. Use this medicine as directed by your caregiver if a severe reaction  is occurring. Failure to do so could have a fatal outcome. SEEK MEDICAL CARE IF:  You suspect a food allergy. Symptoms generally happen within 30 minutes of eating a food.  Your symptoms have not gone away within 2 days or are getting worse.  You develop new symptoms.  You want to retest yourself or your child with a food or drink you think causes an allergic reaction. Never do this if an anaphylactic reaction to that food or drink has happened before. Only do this under the care of a caregiver. SEEK IMMEDIATE MEDICAL CARE IF:   You have difficulty breathing, are wheezing, or have a tight feeling in your chest or throat.  You have a swollen mouth, or you have hives, swelling, or itching all over your body.  You have had a severe reaction that has responded to your anaphylaxis kit or an EpiPen. These reactions may return when the medicine has worn off. These reactions should be considered life threatening. MAKE SURE YOU:   Understand these instructions.  Will watch your condition.  Will get help right away if you are not doing well or get worse. Document Released: 12/07/2002 Document Revised: 01/08/2013 Document Reviewed: 05/13/2008 [facility] Patient Information 2015 [place], [place]. This information is not intended to replace advice given to you by your health care provider. Make sure you discuss any questions you have with your health care provider.

## 2015-01-12 DIAGNOSIS — T7840XA Allergy, unspecified, initial encounter: Secondary | ICD-10-CM | POA: Insufficient documentation

## 2015-01-12 NOTE — Progress Notes (Signed)
Annette Snow  [id number] [birth year] 01/12/2015      Progress Note-Follow Up  Subjective  Chief Complaint  Chief Complaint  Patient presents with  . Follow-up    4 mo    HPI  Patient is a 54 y.o. female in today for routine medical care. Patient is in today for routine follow-up. Has been struggling with worsening allergies recently antihistamines are partially helpful. Has also been struggling with a great deal of stress secondary to the unexpected death of her 35 year old sister from complications of primary biliary cirrhosis. She is also been physically ill herself with respiratory symptoms largely due to stress and travel. Feeling better today except for congestion. Continues to struggle some with abdominal pain and IBS symptoms but manageable at this time  Past Medical History  Diagnosis Date  . Ulcer 1984    petic and duodenal  . IBS (irritable bowel syndrome)   . Migraines   . Hemorrhoids   . Blood transfusion   . 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 Neuropathy, sacral plexus 09/13/2011  . Osgood-Schlatter's disease 09/13/2011  . Endometriosis 09/13/2011  . Abdominal pain 09/13/2011  . Foot pain, right 09/13/2011  . Adenomatous colon polyp   . IBS (irritable bowel syndrome)   . ECG abnormal 11/19/2011    Past Surgical History  Procedure Laterality Date  . Laparoscopic hysterectomy  2000    total for extensive endometriosis  . Bunionectomy      bilateral  . Ankle reconstruction      left @ age 14  . Knee arthroscopy      bilateral  3 on left 2 on right  . Breast enhancement surgery    . Wrist reconstruction      left twice  . Foot surgery      left twice  . Tonsillectomy and adenoidectomy    . Laparoscopy      multiple times for endometriosis on all organs  . Abdominal hysterectomy    . 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 Metatarsal osteotomy  11/24/2011    Procedure: METATARSAL OSTEOTOMY;  Surgeon: Wallene Huh, DPM;  Location: [facility];  Service:  Podiatry;  Laterality: Right;  RIGHT AIKENS OSTEOTOMY; RIGHT METATARSAL OSTETOMY Second & Third WITH SCREW; Right Second arthroplasty lesser metatarsalphalangeal with total implant, FUSION Third TOE RIGHT  . Eye surgery  01/23/2012    cosmetic lift, eyelids, b/l    Family History  Problem Relation Age of Onset  . Alzheimer's disease Maternal Grandmother   . Heart attack Maternal Grandfather   . Leukemia Paternal Grandfather   . Liver disease Sister   . Cirrhosis Sister     primary biliary cirrhosis  . Heart disease Father     rheumatic heart disease    History   Social History  . Marital Status: Single    Spouse Name: N/A  . Number of Children: N/A  . Years of Education: N/A   Occupational History  . Not on file.   Social History Main Topics  . Smoking status: Former Smoker -- 0.00 packs/day    Quit date: 11/16/2008  . Smokeless tobacco: Never Used  . Alcohol Use: 3.6 oz/week    6 Cans of beer per week  . Drug Use: Yes    Special: Marijuana     Comment: for pain  . Sexual Activity: No   Other Topics Concern  . Not on file   Social History Narrative    Current Outpatient Prescriptions on File Prior to  Visit  Medication Sig Dispense Refill  . albuterol (PROVENTIL HFA;VENTOLIN HFA) 108 (90 BASE) MCG/ACT inhaler Inhale 2 puffs into the lungs every 6 (six) hours as needed for wheezing or shortness of breath. 1 Inhaler 1  . famciclovir (FAMVIR) 500 MG tablet Take 3 tablets at onset of symptoms.  Do not take more that 3 tablets per outbreak. 15 tablet 0  . Green Tea, Camillia sinensis, (GREEN TEA EXTRACT PO) Take 1 capsule by mouth daily.    . methylPREDNIsolone (MEDROL DOSPACK) 4 MG tablet follow package directions 21 tablet 0  . omega-3 acid ethyl esters (LOVAZA) 1 G capsule Take 1 g by mouth daily.    Marland Kitchen OVER THE COUNTER MEDICATION Hyloronic Acid- 2 capsules daily    . VIVELLE-DOT 0.1 MG/24HR Place 1 patch onto the skin every 3 (three) days.      No current  facility-administered medications on file prior to visit.    Allergies  Allergen Reactions  . Demerol [Meperidine] Nausea And Vomiting  . Nucynta [Tapentadol Hydrochloride] Itching  . Adhesive [Tape] Rash    Review of Systems  Review of Systems  Constitutional: Negative for fever and malaise/fatigue.  HENT: Positive for congestion. Negative for nosebleeds and sore throat.   Eyes: Negative for discharge.  Respiratory: Negative for shortness of breath.   Cardiovascular: Negative for chest pain, palpitations and leg swelling.  Gastrointestinal: Positive for nausea and abdominal pain. Negative for diarrhea, blood in stool and melena.  Genitourinary: Negative for dysuria.  Musculoskeletal: Negative for falls.  Skin: Negative for rash.  Neurological: Negative for loss of consciousness and headaches.  Endo/Heme/Allergies: Negative for polydipsia.  Psychiatric/Behavioral: Negative for depression and suicidal ideas. The patient is not nervous/anxious and does not have insomnia.     Objective  BP 107/73 mmHg  Pulse 76  Temp(Src) 98.6 F (37 C) (Oral)  Resp 18  Ht 5\' 6"  (1.676 m)  Wt 135 lb (61.236 kg)  BMI 21.80 kg/m2  SpO2 98%  Physical Exam  Physical Exam  Constitutional: She is oriented to person, place, and time and well-developed, well-nourished, and in no distress. No distress.  HENT:  Head: Normocephalic and atraumatic.  Eyes: Conjunctivae are normal.  Neck: Neck supple. No thyromegaly present.  Cardiovascular: Normal rate, regular rhythm and normal heart sounds.   No murmur heard. Pulmonary/Chest: Effort normal and breath sounds normal. She has no wheezes.  Abdominal: Bowel sounds are normal. She exhibits no distension and no mass.  Musculoskeletal: She exhibits no edema.  Lymphadenopathy:    She has no cervical adenopathy.  Neurological: She is alert and oriented to person, place, and time.  Skin: Skin is warm and dry. No rash noted. She is not diaphoretic.    Psychiatric: Memory, affect and judgment normal.    Lab Results  Component Value Date   TSH 0.47 07/22/2014   Lab Results  Component Value Date   WBC 6.5 07/22/2014   HGB 13.6 07/22/2014   HCT 40.6 07/22/2014   MCV 95.5 07/22/2014   PLT 244.0 07/22/2014   Lab Results  Component Value Date   CREATININE 0.9 07/22/2014   BUN 15 07/22/2014   NA 136 07/22/2014   K 4.2 07/22/2014   CL 100 07/22/2014   CO2 29 07/22/2014   Lab Results  Component Value Date   ALT 14 07/22/2014   AST 19 07/22/2014   ALKPHOS 47 07/22/2014   BILITOT 0.8 07/22/2014   Lab Results  Component Value Date   CHOL 194 01/24/2013  Lab Results  Component Value Date   HDL 98.10 01/24/2013   Lab Results  Component Value Date   LDLCALC 86 01/24/2013   Lab Results  Component Value Date   TRIG 50.0 01/24/2013   Lab Results  Component Value Date   CHOLHDL 2 01/24/2013     Assessment & Plan  Endometriosis Infertility, hi testosterone Follows with GYN in [place], [place] where she used to live. Has appt soon   IBS (irritable bowel syndrome) Avoid offending foods, continue probiotics. Do not eat large meals in late evening and consider raising head of bed.    Allergic state May use antihistamines bid, Flonase daily, nasal saline prn   Nausea without vomiting Avoid offending foods, small meals. Ginger prn, report worsening symptoms

## 2015-01-12 NOTE — Assessment & Plan Note (Signed)
Infertility, hi testosterone Follows with GYN in [place], [place] where she used to live. Has appt soon

## 2015-01-12 NOTE — Assessment & Plan Note (Signed)
Avoid offending foods, small meals. Ginger prn, report worsening symptoms

## 2015-01-12 NOTE — Assessment & Plan Note (Signed)
Avoid offending foods, continue probiotics. Do not eat large meals in late evening and consider raising head of bed.

## 2015-01-12 NOTE — Assessment & Plan Note (Signed)
May use antihistamines bid, Flonase daily, nasal saline prn

## 2015-03-07 ENCOUNTER — Other Ambulatory Visit: Payer: Self-pay

## 2015-03-27 ENCOUNTER — Encounter: Payer: Self-pay | Admitting: Family Medicine

## 2015-03-27 ENCOUNTER — Ambulatory Visit (INDEPENDENT_AMBULATORY_CARE_PROVIDER_SITE_OTHER): Payer: 59 | Admitting: Family Medicine

## 2015-03-27 VITALS — BP 120/72 | HR 61 | Temp 98.3°F | Ht 66.0 in | Wt 133.2 lb

## 2015-03-27 DIAGNOSIS — R002 Palpitations: Secondary | ICD-10-CM | POA: Diagnosis not present

## 2015-03-27 DIAGNOSIS — R5383 Other fatigue: Secondary | ICD-10-CM

## 2015-03-27 DIAGNOSIS — R110 Nausea: Secondary | ICD-10-CM

## 2015-03-27 DIAGNOSIS — R197 Diarrhea, unspecified: Secondary | ICD-10-CM

## 2015-03-27 DIAGNOSIS — K589 Irritable bowel syndrome without diarrhea: Secondary | ICD-10-CM

## 2015-03-27 DIAGNOSIS — R9431 Abnormal electrocardiogram [ECG] [EKG]: Secondary | ICD-10-CM

## 2015-03-27 LAB — COMPREHENSIVE METABOLIC PANEL
ALK PHOS: 41 U/L (ref 39–117)
ALT: 14 U/L (ref 0–35)
AST: 17 U/L (ref 0–37)
Albumin: 3.9 g/dL (ref 3.5–5.2)
BILIRUBIN TOTAL: 1.1 mg/dL (ref 0.2–1.2)
BUN: 10 mg/dL (ref 6–23)
CO2: 30 meq/L (ref 19–32)
CREATININE: 0.82 mg/dL (ref 0.40–1.20)
Calcium: 9.1 mg/dL (ref 8.4–10.5)
Chloride: 103 mEq/L (ref 96–112)
GFR: 76.6 mL/min (ref >60.00)
GLUCOSE: 83 mg/dL (ref 70–99)
Potassium: 4 mEq/L (ref 3.5–5.1)
Sodium: 138 mEq/L (ref 135–145)
TOTAL PROTEIN: 6.6 g/dL (ref 6.0–8.3)

## 2015-03-27 LAB — SEDIMENTATION RATE: SED RATE: 8 mm/h (ref 0–22)

## 2015-03-27 LAB — CBC
HCT: 41.3 % (ref 36.0–46.0)
HEMOGLOBIN: 13.7 g/dL (ref 12.0–15.0)
MCHC: 33 g/dL (ref 30.0–36.0)
MCV: 97.3 fl (ref 78.0–100.0)
Platelets: 246 10*3/uL (ref 150.0–400.0)
RBC: 4.25 Mil/uL (ref 3.87–5.11)
RDW: 13.7 % (ref 11.5–15.5)
WBC: 9.6 10*3/uL (ref 4.0–10.5)

## 2015-03-27 LAB — MAGNESIUM: MAGNESIUM: 2 mg/dL (ref 1.5–2.5)

## 2015-03-27 LAB — TSH: TSH: 0.78 u[IU]/mL (ref 0.35–4.50)

## 2015-03-27 NOTE — Progress Notes (Signed)
Annette Snow  [id number] 10-22-1960 03/27/2015      Progress Note-Follow Up  Subjective  Chief Complaint  Chief Complaint  Patient presents with  . Fatigue  . Palpitations  . Nausea    HPI Patient is a 54 year old female in today for routine medical care. Complaining of worsening fatigue. She's been having trouble off and on for about the last month. She's had 3 separate incidents while exercising once while hiking once while walking and once kayaking where she had some palpitations with nausea fatigue and yawning. She notes when she rested the symptoms resolved quickly. She also notes she's just general been more fatigued over the last 2 weeks she's also had an increase in nausea and loose stool. No fevers or chills. She has had some increased head congestion and fever blisters as well. Denies CP/palp/SOB/HA/congestion/fevers or GU c/o. Taking meds as prescribed  Past Medical History  Diagnosis Date  . Ulcer 1984    petic and duodenal  . IBS (irritable bowel syndrome)   . Migraines   . Hemorrhoids   . Blood transfusion   . 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 Neuropathy, sacral plexus 09/13/2011  . Osgood-Schlatter's disease 09/13/2011  . Endometriosis 09/13/2011  . Abdominal pain 09/13/2011  . Foot pain, right 09/13/2011  . Adenomatous colon polyp   . IBS (irritable bowel syndrome)   . ECG abnormal 11/19/2011    Past Surgical History  Procedure Laterality Date  . Laparoscopic hysterectomy  2000    total for extensive endometriosis  . Bunionectomy      bilateral  . Ankle reconstruction      left @ age 1  . Knee arthroscopy      bilateral  3 on left 2 on right  . Breast enhancement surgery    . Wrist reconstruction      left twice  . Foot surgery      left twice  . Tonsillectomy and adenoidectomy    . Laparoscopy      multiple times for endometriosis on all organs  . Abdominal hysterectomy    . 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 Metatarsal osteotomy  11/24/2011    Procedure:  METATARSAL OSTEOTOMY;  Surgeon: Wallene Huh, DPM;  Location: [facility];  Service: Podiatry;  Laterality: Right;  RIGHT AIKENS OSTEOTOMY; RIGHT METATARSAL OSTETOMY Second & Third WITH SCREW; Right Second arthroplasty lesser metatarsalphalangeal with total implant, FUSION Third TOE RIGHT  . Eye surgery  01/23/2012    cosmetic lift, eyelids, b/l    Family History  Problem Relation Age of Onset  . Alzheimer's disease Maternal Grandmother   . Heart attack Maternal Grandfather   . Leukemia Paternal Grandfather   . Liver disease Sister   . Cirrhosis Sister     primary biliary cirrhosis  . Heart disease Father     rheumatic heart disease    History   Social History  . Marital Status: Single    Spouse Name: N/A  . Number of Children: N/A  . Years of Education: N/A   Occupational History  . Not on file.   Social History Main Topics  . Smoking status: Former Smoker -- 0.00 packs/day    Quit date: 11/16/2008  . Smokeless tobacco: Never Used  . Alcohol Use: 3.6 oz/week    6 Cans of beer per week  . Drug Use: Yes    Special: Marijuana     Comment: for pain  . Sexual Activity: No   Other Topics  Concern  . Not on file   Social History Narrative    Current Outpatient Prescriptions on File Prior to Visit  Medication Sig Dispense Refill  . albuterol (PROVENTIL HFA;VENTOLIN HFA) 108 (90 BASE) MCG/ACT inhaler Inhale 2 puffs into the lungs every 6 (six) hours as needed for wheezing or shortness of breath. 1 Inhaler 1  . famciclovir (FAMVIR) 500 MG tablet Take 3 tablets at onset of symptoms.  Do not take more that 3 tablets per outbreak. 15 tablet 0  . fluticasone (FLONASE) 50 MCG/ACT nasal spray Place 2 sprays into both nostrils daily as needed for allergies or rhinitis. 16 g 6  . Green Tea, Camillia sinensis, (GREEN TEA EXTRACT PO) Take 1 capsule by mouth daily.    Marland Kitchen LATISSE 0.03 % ophthalmic solution   11  . omega-3 acid ethyl esters (LOVAZA) 1 G capsule Take 1 g by  mouth daily.    Marland Kitchen OVER THE COUNTER MEDICATION Hyloronic Acid- 2 capsules daily    . VIVELLE-DOT 0.1 MG/24HR Place 1 patch onto the skin every 3 (three) days.      No current facility-administered medications on file prior to visit.    Allergies  Allergen Reactions  . Demerol [Meperidine] Nausea And Vomiting  . Nucynta [Tapentadol Hydrochloride] Itching  . Adhesive [Tape] Rash    Review of Systems  Review of Systems  Constitutional: Positive for malaise/fatigue. Negative for fever.  HENT: Negative for congestion.   Eyes: Negative for discharge.  Respiratory: Negative for shortness of breath.   Cardiovascular: Positive for palpitations. Negative for chest pain and leg swelling.  Gastrointestinal: Positive for nausea, abdominal pain and constipation. Negative for diarrhea.  Genitourinary: Negative for dysuria.  Musculoskeletal: Negative for falls.  Skin: Negative for rash.  Neurological: Negative for loss of consciousness and headaches.  Endo/Heme/Allergies: Negative for polydipsia.  Psychiatric/Behavioral: Negative for depression and suicidal ideas. The patient is not nervous/anxious and does not have insomnia.     Objective  BP 120/72 mmHg  Pulse 61  Temp(Src) 98.3 F (36.8 C) (Oral)  Ht 5\' 6"  (1.676 m)  Wt 133 lb 3 oz (60.413 kg)  BMI 21.51 kg/m2  SpO2 99%  Physical Exam  Physical Exam  Constitutional: She is oriented to person, place, and time and well-developed, well-nourished, and in no distress. No distress.  HENT:  Head: Normocephalic and atraumatic.  Eyes: Conjunctivae are normal.  Neck: Neck supple. No thyromegaly present.  Cardiovascular: Normal rate, regular rhythm and normal heart sounds.   No murmur heard. Pulmonary/Chest: Effort normal and breath sounds normal. She has no wheezes.  Abdominal: She exhibits no distension and no mass.  Musculoskeletal: She exhibits no edema.  Lymphadenopathy:    She has no cervical adenopathy.  Neurological: She is  alert and oriented to person, place, and time.  Skin: Skin is warm and dry. No rash noted. She is not diaphoretic.  Psychiatric: Memory, affect and judgment normal.    Lab Results  Component Value Date   TSH 0.47 07/22/2014   Lab Results  Component Value Date   WBC 6.5 07/22/2014   HGB 13.6 07/22/2014   HCT 40.6 07/22/2014   MCV 95.5 07/22/2014   PLT 244.0 07/22/2014   Lab Results  Component Value Date   CREATININE 0.9 07/22/2014   BUN 15 07/22/2014   NA 136 07/22/2014   K 4.2 07/22/2014   CL 100 07/22/2014   CO2 29 07/22/2014   Lab Results  Component Value Date   ALT 14 07/22/2014  AST 19 07/22/2014   ALKPHOS 47 07/22/2014   BILITOT 0.8 07/22/2014   Lab Results  Component Value Date   CHOL 194 01/24/2013   Lab Results  Component Value Date   HDL 98.10 01/24/2013   Lab Results  Component Value Date   LDLCALC 86 01/24/2013   Lab Results  Component Value Date   TRIG 50.0 01/24/2013   Lab Results  Component Value Date   CHOLHDL 2 01/24/2013     Assessment & Plan  IBS (irritable bowel syndrome) Notes an increase in loose stool in past week. Usually has one loose stool daily and has been having 5-6 loose stool daily this week, no increase in abdominal pain or fevers. Does daily probiotics and will continue with bowel rest. Likely a gastroenteritis  Nausea without vomiting Nausea has been increased with the palpitations. No vomiting. Encouraged bland diet and ginger prn  Fatigue Worse recently notes some palpitations with some nausea and excessive fatigue with exertion she would usually tolerate. She has been exercising lately when these episodes occur. Episodes resolve with rest, ekg normal today. If episodes persist will need referral to cardiology. Labs unremarkable today.   ECG abnormal No acute concerns on EKG today

## 2015-03-27 NOTE — Progress Notes (Signed)
Pre visit review using our clinic review tool, if applicable. No additional management support is needed unless otherwise documented below in the visit note. 

## 2015-03-27 NOTE — Assessment & Plan Note (Addendum)
Nausea has been increased with the palpitations. No vomiting. Encouraged bland diet and ginger prn

## 2015-03-27 NOTE — Patient Instructions (Signed)

## 2015-03-27 NOTE — Assessment & Plan Note (Addendum)
Notes an increase in loose stool in past week. Usually has one loose stool daily and has been having 5-6 loose stool daily this week, no increase in abdominal pain or fevers. Does daily probiotics and will continue with bowel rest. Likely a gastroenteritis

## 2015-04-06 ENCOUNTER — Encounter: Payer: Self-pay | Admitting: Family Medicine

## 2015-04-06 NOTE — Assessment & Plan Note (Signed)
No acute concerns on EKG today

## 2015-04-06 NOTE — Assessment & Plan Note (Addendum)
Worse recently notes some palpitations with some nausea and excessive fatigue with exertion she would usually tolerate. She has been exercising lately when these episodes occur. Episodes resolve with rest, ekg normal today. If episodes persist will need referral to cardiology. Labs unremarkable today.

## 2015-08-12 ENCOUNTER — Other Ambulatory Visit: Payer: Self-pay | Admitting: Family Medicine

## 2015-08-12 DIAGNOSIS — B009 Herpesviral infection, unspecified: Secondary | ICD-10-CM

## 2015-08-12 DIAGNOSIS — R0602 Shortness of breath: Secondary | ICD-10-CM

## 2015-08-12 MED ORDER — ALBUTEROL SULFATE HFA 108 (90 BASE) MCG/ACT IN AERS: 2.0000 | INHALATION_SPRAY | Freq: Four times a day (QID) | RESPIRATORY_TRACT | Status: DC | PRN

## 2015-08-12 MED ORDER — FAMCICLOVIR 500 MG PO TABS: ORAL_TABLET | ORAL | Status: DC

## 2015-09-30 ENCOUNTER — Encounter: Payer: 59 | Admitting: Family Medicine

## 2015-10-06 ENCOUNTER — Other Ambulatory Visit: Payer: Self-pay | Admitting: Plastic Surgery

## 2015-10-28 ENCOUNTER — Other Ambulatory Visit: Payer: Self-pay | Admitting: Plastic Surgery

## 2015-11-05 DIAGNOSIS — H9190 Unspecified hearing loss, unspecified ear: Secondary | ICD-10-CM | POA: Insufficient documentation

## 2015-11-05 DIAGNOSIS — C44721 Squamous cell carcinoma of skin of unspecified lower limb, including hip: Secondary | ICD-10-CM | POA: Insufficient documentation

## 2015-11-10 ENCOUNTER — Telehealth: Payer: Self-pay | Admitting: *Deleted

## 2015-11-10 NOTE — Telephone Encounter (Signed)
Unable to reach patient at time of call.  Left message for patient to return call when available.   

## 2015-12-08 ENCOUNTER — Other Ambulatory Visit: Payer: Self-pay | Admitting: Orthopedic Surgery

## 2015-12-18 DIAGNOSIS — M1811 Unilateral primary osteoarthritis of first carpometacarpal joint, right hand: Secondary | ICD-10-CM | POA: Insufficient documentation

## 2016-01-29 ENCOUNTER — Other Ambulatory Visit: Payer: Self-pay

## 2016-01-29 DIAGNOSIS — Z1231 Encounter for screening mammogram for malignant neoplasm of breast: Secondary | ICD-10-CM

## 2016-01-29 DIAGNOSIS — Z9882 Breast implant status: Secondary | ICD-10-CM

## 2016-02-06 ENCOUNTER — Telehealth: Payer: Self-pay

## 2016-02-09 ENCOUNTER — Ambulatory Visit (INDEPENDENT_AMBULATORY_CARE_PROVIDER_SITE_OTHER): Payer: PRIVATE HEALTH INSURANCE | Admitting: Family Medicine

## 2016-02-09 ENCOUNTER — Other Ambulatory Visit: Payer: Self-pay

## 2016-02-09 ENCOUNTER — Encounter: Payer: Self-pay | Admitting: Family Medicine

## 2016-02-09 ENCOUNTER — Ambulatory Visit
Admission: RE | Admit: 2016-02-09 | Discharge: 2016-02-09 | Disposition: A | Discharge status: Home / Self Care | Payer: PRIVATE HEALTH INSURANCE | Source: Ambulatory Visit

## 2016-02-09 VITALS — BP 94/64 | HR 71 | Temp 98.1°F | Ht 66.0 in | Wt 128.2 lb

## 2016-02-09 DIAGNOSIS — C4499 Other specified malignant neoplasm of skin, unspecified: Secondary | ICD-10-CM

## 2016-02-09 DIAGNOSIS — Z85828 Personal history of other malignant neoplasm of skin: Secondary | ICD-10-CM | POA: Diagnosis not present

## 2016-02-09 DIAGNOSIS — Z23 Encounter for immunization: Secondary | ICD-10-CM | POA: Diagnosis not present

## 2016-02-09 DIAGNOSIS — Z9882 Breast implant status: Secondary | ICD-10-CM

## 2016-02-09 DIAGNOSIS — Z1159 Encounter for screening for other viral diseases: Secondary | ICD-10-CM

## 2016-02-09 DIAGNOSIS — Z0000 Encounter for general adult medical examination without abnormal findings: Secondary | ICD-10-CM | POA: Diagnosis not present

## 2016-02-09 DIAGNOSIS — L719 Rosacea, unspecified: Secondary | ICD-10-CM

## 2016-02-09 DIAGNOSIS — Z1231 Encounter for screening mammogram for malignant neoplasm of breast: Secondary | ICD-10-CM

## 2016-02-09 DIAGNOSIS — K589 Irritable bowel syndrome without diarrhea: Secondary | ICD-10-CM

## 2016-02-09 DIAGNOSIS — Z114 Encounter for screening for human immunodeficiency virus [HIV]: Secondary | ICD-10-CM

## 2016-02-09 LAB — CBC
HCT: 41.3 % (ref 36.0–46.0)
HEMOGLOBIN: 13.8 g/dL (ref 12.0–15.0)
MCHC: 33.4 g/dL (ref 30.0–36.0)
MCV: 94.4 fl (ref 78.0–100.0)
PLATELETS: 241 10*3/uL (ref 150.0–400.0)
RBC: 4.37 Mil/uL (ref 3.87–5.11)
RDW: 13.6 % (ref 11.5–15.5)
WBC: 8.9 10*3/uL (ref 4.0–10.5)

## 2016-02-09 LAB — COMPREHENSIVE METABOLIC PANEL
ALBUMIN: 4.3 g/dL (ref 3.5–5.2)
ALT: 15 U/L (ref 0–35)
AST: 21 U/L (ref 0–37)
Alkaline Phosphatase: 41 U/L (ref 39–117)
BUN: 10 mg/dL (ref 6–23)
CALCIUM: 9.3 mg/dL (ref 8.4–10.5)
CHLORIDE: 100 meq/L (ref 96–112)
CO2: 29 meq/L (ref 19–32)
CREATININE: 0.7 mg/dL (ref 0.40–1.20)
GFR: 91.65 mL/min (ref >60.00)
Glucose, Bld: 90 mg/dL (ref 70–99)
POTASSIUM: 3.9 meq/L (ref 3.5–5.1)
Sodium: 136 mEq/L (ref 135–145)
Total Bilirubin: 1.1 mg/dL (ref 0.2–1.2)
Total Protein: 6.8 g/dL (ref 6.0–8.3)

## 2016-02-09 LAB — TSH: TSH: 0.61 u[IU]/mL (ref 0.35–4.50)

## 2016-02-09 LAB — LIPID PANEL
CHOL/HDL RATIO: 2
CHOLESTEROL: 208 mg/dL — AB (ref 0–200)
HDL: 85.2 mg/dL (ref >39.00)
LDL CALC: 113 mg/dL — AB (ref 0–99)
NonHDL: 123.05
TRIGLYCERIDES: 49 mg/dL (ref 0.0–149.0)
VLDL: 9.8 mg/dL (ref 0.0–40.0)

## 2016-02-09 LAB — HIV ANTIBODY (ROUTINE TESTING W REFLEX): HIV: NONREACTIVE

## 2016-02-09 MED ORDER — METRONIDAZOLE 1 % EX GEL: Freq: Every day | CUTANEOUS | Status: DC

## 2016-02-09 MED ORDER — MENINGOCOCCAL A C Y&W-135 CONJ IM INJ: 0.5000 mL | INJECTION | Freq: Once | INTRAMUSCULAR | Status: DC

## 2016-02-09 NOTE — Assessment & Plan Note (Signed)
Continues to happen, check labs,

## 2016-02-09 NOTE — Assessment & Plan Note (Signed)
Is

## 2016-02-09 NOTE — Progress Notes (Signed)
Pre visit review using our clinic review tool, if applicable. No additional management support is needed unless otherwise documented below in the visit note. 

## 2016-02-09 NOTE — Assessment & Plan Note (Signed)
Doing better at this time

## 2016-02-09 NOTE — Patient Instructions (Addendum)
Rel of records Dr. Harlow Mares clinic note and pathology And Dr. Renda Rolls, and Dr. Bluford Main at [facility] [place].   3-5 drops of peroxide in ears after shower as needed. Come back in 8 weeks for pneumo 23 vacine and 2nd menectra vaccine. Preventive Care for Adults, Female A healthy lifestyle and preventive care can promote health and wellness. Preventive health guidelines for women include the following key practices.  A routine yearly physical is a good way to check with your health care provider about your health and preventive screening. It is a chance to share any concerns and updates on your health and to receive a thorough exam.  Visit your dentist for a routine exam and preventive care every 6 months. Brush your teeth twice a day and floss once a day. Good oral hygiene prevents tooth decay and gum disease.  The frequency of eye exams is based on your age, health, family medical history, use of contact lenses, and other factors. Follow your health care provider's recommendations for frequency of eye exams.  Eat a healthy diet. Foods like vegetables, fruits, whole grains, low-fat dairy products, and lean protein foods contain the nutrients you need without too many calories. Decrease your intake of foods high in solid fats, added sugars, and salt. Eat the right amount of calories for you.Get information about a proper diet from your health care provider, if necessary.  Regular physical exercise is one of the most important things you can do for your health. Most adults should get at least 150 minutes of moderate-intensity exercise (any activity that increases your heart rate and causes you to sweat) each week. In addition, most adults need muscle-strengthening exercises on 2 or more days a week.  Maintain a healthy weight. The body mass index (BMI) is a screening tool to identify possible weight problems. It provides an estimate of body fat based on height and weight. Your health  care provider can find your BMI and can help you achieve or maintain a healthy weight.For adults 20 years and older:  A BMI below 18.5 is considered underweight.  A BMI of 18.5 to 24.9 is normal.  A BMI of 25 to 29.9 is considered overweight.  A BMI of 30 and above is considered obese.  Maintain normal blood lipids and cholesterol levels by exercising and minimizing your intake of saturated fat. Eat a balanced diet with plenty of fruit and vegetables. Blood tests for lipids and cholesterol should begin at age 24 and be repeated every 5 years. If your lipid or cholesterol levels are high, you are over 50, or you are at high risk for heart disease, you may need your cholesterol levels checked more frequently.Ongoing high lipid and cholesterol levels should be treated with medicines if diet and exercise are not working.  If you smoke, find out from your health care provider how to quit. If you do not use tobacco, do not start.  Lung cancer screening is recommended for adults aged 20-80 years who are at high risk for developing lung cancer because of a history of smoking. A yearly low-dose CT scan of the lungs is recommended for people who have at least a 30-pack-year history of smoking and are a current smoker or have quit within the past 15 years. A pack year of smoking is smoking an average of 1 pack of cigarettes a day for 1 year (for example: 1 pack a day for 30 years or 2 packs a day for 15 years). Yearly screening  should continue until the smoker has stopped smoking for at least 15 years. Yearly screening should be stopped for people who develop a health problem that would prevent them from having lung cancer treatment.  If you are pregnant, do not drink alcohol. If you are breastfeeding, be very cautious about drinking alcohol. If you are not pregnant and choose to drink alcohol, do not have more than 1 drink per day. One drink is considered to be 12 ounces (355 mL) of beer, 5 ounces (148 mL)  of wine, or 1.5 ounces (44 mL) of liquor.  Avoid use of street drugs. Do not share needles with anyone. Ask for help if you need support or instructions about stopping the use of drugs.  High blood pressure causes heart disease and increases the risk of stroke. Your blood pressure should be checked at least every 1 to 2 years. Ongoing high blood pressure should be treated with medicines if weight loss and exercise do not work.  If you are 64-7 years old, ask your health care provider if you should take aspirin to prevent strokes.  Diabetes screening is done by taking a blood sample to check your blood glucose level after you have not eaten for a certain period of time (fasting). If you are not overweight and you do not have risk factors for diabetes, you should be screened once every 3 years starting at age 67. If you are overweight or obese and you are 33-70 years of age, you should be screened for diabetes every year as part of your cardiovascular risk assessment.  Breast cancer screening is essential preventive care for women. You should practice "breast self-awareness." This means understanding the normal appearance and feel of your breasts and may include breast self-examination. Any changes detected, no matter how small, should be reported to a health care provider. Women in their 33s and 30s should have a clinical breast exam (CBE) by a health care provider as part of a regular health exam every 1 to 3 years. After age 42, women should have a CBE every year. Starting at age 52, women should consider having a mammogram (breast X-ray test) every year. Women who have a family history of breast cancer should talk to their health care provider about genetic screening. Women at a high risk of breast cancer should talk to their health care providers about having an MRI and a mammogram every year.  Breast cancer gene (BRCA)-related cancer risk assessment is recommended for women who have family members  with BRCA-related cancers. BRCA-related cancers include breast, ovarian, tubal, and peritoneal cancers. Having family members with these cancers may be associated with an increased risk for harmful changes (mutations) in the breast cancer genes BRCA1 and BRCA2. Results of the assessment will determine the need for genetic counseling and BRCA1 and BRCA2 testing.  Your health care provider may recommend that you be screened regularly for cancer of the pelvic organs (ovaries, uterus, and vagina). This screening involves a pelvic examination, including checking for microscopic changes to the surface of your cervix (Pap test). You may be encouraged to have this screening done every 3 years, beginning at age 52.  For women ages 71-65, health care providers may recommend pelvic exams and Pap testing every 3 years, or they may recommend the Pap and pelvic exam, combined with testing for human papilloma virus (HPV), every 5 years. Some types of HPV increase your risk of cervical cancer. Testing for HPV may also be done on women of any  age with unclear Pap test results.  Other health care providers may not recommend any screening for nonpregnant women who are considered low risk for pelvic cancer and who do not have symptoms. Ask your health care provider if a screening pelvic exam is right for you.  If you have had past treatment for cervical cancer or a condition that could lead to cancer, you need Pap tests and screening for cancer for at least 20 years after your treatment. If Pap tests have been discontinued, your risk factors (such as having a new sexual partner) need to be reassessed to determine if screening should resume. Some women have medical problems that increase the chance of getting cervical cancer. In these cases, your health care provider may recommend more frequent screening and Pap tests.  Colorectal cancer can be detected and often prevented. Most routine colorectal cancer screening begins at  the age of 77 years and continues through age 79 years. However, your health care provider may recommend screening at an earlier age if you have risk factors for colon cancer. On a yearly basis, your health care provider may provide home test kits to check for hidden blood in the stool. Use of a small camera at the end of a tube, to directly examine the colon (sigmoidoscopy or colonoscopy), can detect the earliest forms of colorectal cancer. Talk to your health care provider about this at age 59, when routine screening begins. Direct exam of the colon should be repeated every 5-10 years through age 62 years, unless early forms of precancerous polyps or small growths are found.  People who are at an increased risk for hepatitis B should be screened for this virus. You are considered at high risk for hepatitis B if:  You were born in a country where hepatitis B occurs often. Talk with your health care provider about which countries are considered high risk.  Your parents were born in a high-risk country and you have not received a shot to protect against hepatitis B (hepatitis B vaccine).  You have HIV or AIDS.  You use needles to inject street drugs.  You live with, or have sex with, someone who has hepatitis B.  You get hemodialysis treatment.  You take certain medicines for conditions like cancer, organ transplantation, and autoimmune conditions.  Hepatitis C blood testing is recommended for all people born from [birth year] through 1965 and any individual with known risks for hepatitis C.  Practice safe sex. Use condoms and avoid high-risk sexual practices to reduce the spread of sexually transmitted infections (STIs). STIs include gonorrhea, chlamydia, syphilis, trichomonas, herpes, HPV, and human immunodeficiency virus (HIV). Herpes, HIV, and HPV are viral illnesses that have no cure. They can result in disability, cancer, and death.  You should be screened for sexually transmitted illnesses  (STIs) including gonorrhea and chlamydia if:  You are sexually active and are younger than 24 years.  You are older than 24 years and your health care provider tells you that you are at risk for this type of infection.  Your sexual activity has changed since you were last screened and you are at an increased risk for chlamydia or gonorrhea. Ask your health care provider if you are at risk.  If you are at risk of being infected with HIV, it is recommended that you take a prescription medicine daily to prevent HIV infection. This is called preexposure prophylaxis (PrEP). You are considered at risk if:  You are sexually active and do not regularly use  condoms or know the HIV status of your partner(s).  You take drugs by injection.  You are sexually active with a partner who has HIV.  Talk with your health care provider about whether you are at high risk of being infected with HIV. If you choose to begin PrEP, you should first be tested for HIV. You should then be tested every 3 months for as long as you are taking PrEP.  Osteoporosis is a disease in which the bones lose minerals and strength with aging. This can result in serious bone fractures or breaks. The risk of osteoporosis can be identified using a bone density scan. Women ages 74 years and over and women at risk for fractures or osteoporosis should discuss screening with their health care providers. Ask your health care provider whether you should take a calcium supplement or vitamin D to reduce the rate of osteoporosis.  Menopause can be associated with physical symptoms and risks. Hormone replacement therapy is available to decrease symptoms and risks. You should talk to your health care provider about whether hormone replacement therapy is right for you.  Use sunscreen. Apply sunscreen liberally and repeatedly throughout the day. You should seek shade when your shadow is shorter than you. Protect yourself by wearing long sleeves, pants,  a wide-brimmed hat, and sunglasses year round, whenever you are outdoors.  Once a month, do a whole body skin exam, using a mirror to look at the skin on your back. Tell your health care provider of new moles, moles that have irregular borders, moles that are larger than a pencil eraser, or moles that have changed in shape or color.  Stay current with required vaccines (immunizations).  Influenza vaccine. All adults should be immunized every year.  Tetanus, diphtheria, and acellular pertussis (Td, Tdap) vaccine. Pregnant women should receive 1 dose of Tdap vaccine during each pregnancy. The dose should be obtained regardless of the length of time since the last dose. Immunization is preferred during the 27th-36th week of gestation. An adult who has not previously received Tdap or who does not know her vaccine status should receive 1 dose of Tdap. This initial dose should be followed by tetanus and diphtheria toxoids (Td) booster doses every 10 years. Adults with an unknown or incomplete history of completing a 3-dose immunization series with Td-containing vaccines should begin or complete a primary immunization series including a Tdap dose. Adults should receive a Td booster every 10 years.  Varicella vaccine. An adult without evidence of immunity to varicella should receive 2 doses or a second dose if she has previously received 1 dose. Pregnant females who do not have evidence of immunity should receive the first dose after pregnancy. This first dose should be obtained before leaving the health care facility. The second dose should be obtained 4-8 weeks after the first dose.  Human papillomavirus (HPV) vaccine. Females aged 13-26 years who have not received the vaccine previously should obtain the 3-dose series. The vaccine is not recommended for use in pregnant females. However, pregnancy testing is not needed before receiving a dose. If a female is found to be pregnant after receiving a dose, no  treatment is needed. In that case, the remaining doses should be delayed until after the pregnancy. Immunization is recommended for any person with an immunocompromised condition through the age of 33 years if she did not get any or all doses earlier. During the 3-dose series, the second dose should be obtained 4-8 weeks after the first dose. The  third dose should be obtained 24 weeks after the first dose and 16 weeks after the second dose.  Zoster vaccine. One dose is recommended for adults aged 30 years or older unless certain conditions are present.  Measles, mumps, and rubella (MMR) vaccine. Adults born before [birth year] generally are considered immune to measles and mumps. Adults born in [birth year] or later should have 1 or more doses of MMR vaccine unless there is a contraindication to the vaccine or there is laboratory evidence of immunity to each of the three diseases. A routine second dose of MMR vaccine should be obtained at least 28 days after the first dose for students attending postsecondary schools, health care workers, or international travelers. People who received inactivated measles vaccine or an unknown type of measles vaccine during 1963-1967 should receive 2 doses of MMR vaccine. People who received inactivated mumps vaccine or an unknown type of mumps vaccine before 1979 and are at high risk for mumps infection should consider immunization with 2 doses of MMR vaccine. For females of childbearing age, rubella immunity should be determined. If there is no evidence of immunity, females who are not pregnant should be vaccinated. If there is no evidence of immunity, females who are pregnant should delay immunization until after pregnancy. Unvaccinated health care workers born before [birth year] who lack laboratory evidence of measles, mumps, or rubella immunity or laboratory confirmation of disease should consider measles and mumps immunization with 2 doses of MMR vaccine or rubella immunization with 1 dose of  MMR vaccine.  Pneumococcal 13-valent conjugate (PCV13) vaccine. When indicated, a person who is uncertain of his immunization history and has no record of immunization should receive the PCV13 vaccine. All adults 3 years of age and older should receive this vaccine. An adult aged 14 years or older who has certain medical conditions and has not been previously immunized should receive 1 dose of PCV13 vaccine. This PCV13 should be followed with a dose of pneumococcal polysaccharide (PPSV23) vaccine. Adults who are at high risk for pneumococcal disease should obtain the PPSV23 vaccine at least 8 weeks after the dose of PCV13 vaccine. Adults older than 57 years of age who have normal immune system function should obtain the PPSV23 vaccine dose at least 1 year after the dose of PCV13 vaccine.  Pneumococcal polysaccharide (PPSV23) vaccine. When PCV13 is also indicated, PCV13 should be obtained first. All adults aged 27 years and older should be immunized. An adult younger than age 52 years who has certain medical conditions should be immunized. Any person who resides in a nursing home or long-term care facility should be immunized. An adult smoker should be immunized. People with an immunocompromised condition and certain other conditions should receive both PCV13 and PPSV23 vaccines. People with human immunodeficiency virus (HIV) infection should be immunized as soon as possible after diagnosis. Immunization during chemotherapy or radiation therapy should be avoided. Routine use of PPSV23 vaccine is not recommended for American Indians, [place] Natives, or people younger than 65 years unless there are medical conditions that require PPSV23 vaccine. When indicated, people who have unknown immunization and have no record of immunization should receive PPSV23 vaccine. One-time revaccination 5 years after the first dose of PPSV23 is recommended for people aged 19-64 years who have chronic kidney failure, nephrotic  syndrome, asplenia, or immunocompromised conditions. People who received 1-2 doses of PPSV23 before age 15 years should receive another dose of PPSV23 vaccine at age 13 years or later if at least 5 years have passed since  the previous dose. Doses of PPSV23 are not needed for people immunized with PPSV23 at or after age 43 years.  Meningococcal vaccine. Adults with asplenia or persistent complement component deficiencies should receive 2 doses of quadrivalent meningococcal conjugate (MenACWY-D) vaccine. The doses should be obtained at least 2 months apart. Microbiologists working with certain meningococcal bacteria, [place] recruits, people at risk during an outbreak, and people who travel to or live in countries with a high rate of meningitis should be immunized. A first-year college student up through age 44 years who is living in a residence hall should receive a dose if she did not receive a dose on or after her 16th birthday. Adults who have certain high-risk conditions should receive one or more doses of vaccine.  Hepatitis A vaccine. Adults who wish to be protected from this disease, have certain high-risk conditions, work with hepatitis A-infected animals, work in hepatitis A research labs, or travel to or work in countries with a high rate of hepatitis A should be immunized. Adults who were previously unvaccinated and who anticipate close contact with an international adoptee during the first 60 days after arrival in the [place] from a country with a high rate of hepatitis A should be immunized.  Hepatitis B vaccine. Adults who wish to be protected from this disease, have certain high-risk conditions, may be exposed to blood or other infectious body fluids, are household contacts or sex partners of hepatitis B positive people, are clients or workers in certain care facilities, or travel to or work in countries with a high rate of hepatitis B should be immunized.  Haemophilus influenzae type b  (Hib) vaccine. A previously unvaccinated person with asplenia or sickle cell disease or having a scheduled splenectomy should receive 1 dose of Hib vaccine. Regardless of previous immunization, a recipient of a hematopoietic stem cell transplant should receive a 3-dose series 6-12 months after her successful transplant. Hib vaccine is not recommended for adults with HIV infection. Preventive Services / Frequency Ages 74 to 43 years  Blood pressure check.** / Every 3-5 years.  Lipid and cholesterol check.** / Every 5 years beginning at age 29.  Clinical breast exam.** / Every 3 years for women in their 56s and 36s.  BRCA-related cancer risk assessment.** / For women who have family members with a BRCA-related cancer (breast, ovarian, tubal, or peritoneal cancers).  Pap test.** / Every 2 years from ages 69 through 38. Every 3 years starting at age 1 through age 31 or 4 with a history of 3 consecutive normal Pap tests.  HPV screening.** / Every 3 years from ages 69 through ages 14 to 35 with a history of 3 consecutive normal Pap tests.  Hepatitis C blood test.** / For any individual with known risks for hepatitis C.  Skin self-exam. / Monthly.  Influenza vaccine. / Every year.  Tetanus, diphtheria, and acellular pertussis (Tdap, Td) vaccine.** / Consult your health care provider. Pregnant women should receive 1 dose of Tdap vaccine during each pregnancy. 1 dose of Td every 10 years.  Varicella vaccine.** / Consult your health care provider. Pregnant females who do not have evidence of immunity should receive the first dose after pregnancy.  HPV vaccine. / 3 doses over 6 months, if 30 and younger. The vaccine is not recommended for use in pregnant females. However, pregnancy testing is not needed before receiving a dose.  Measles, mumps, rubella (MMR) vaccine.** / You need at least 1 dose of MMR if you were born  in 1957 or later. You may also need a 2nd dose. For females of childbearing  age, rubella immunity should be determined. If there is no evidence of immunity, females who are not pregnant should be vaccinated. If there is no evidence of immunity, females who are pregnant should delay immunization until after pregnancy.  Pneumococcal 13-valent conjugate (PCV13) vaccine.** / Consult your health care provider.  Pneumococcal polysaccharide (PPSV23) vaccine.** / 1 to 2 doses if you smoke cigarettes or if you have certain conditions.  Meningococcal vaccine.** / 1 dose if you are age 76 to 34 years and a Market researcher living in a residence hall, or have one of several medical conditions, you need to get vaccinated against meningococcal disease. You may also need additional booster doses.  Hepatitis A vaccine.** / Consult your health care provider.  Hepatitis B vaccine.** / Consult your health care provider.  Haemophilus influenzae type b (Hib) vaccine.** / Consult your health care provider. Ages 35 to 7 years  Blood pressure check.** / Every year.  Lipid and cholesterol check.** / Every 5 years beginning at age 37 years.  Lung cancer screening. / Every year if you are aged 89-80 years and have a 30-pack-year history of smoking and currently smoke or have quit within the past 15 years. Yearly screening is stopped once you have quit smoking for at least 15 years or develop a health problem that would prevent you from having lung cancer treatment.  Clinical breast exam.** / Every year after age 67 years.  BRCA-related cancer risk assessment.** / For women who have family members with a BRCA-related cancer (breast, ovarian, tubal, or peritoneal cancers).  Mammogram.** / Every year beginning at age 36 years and continuing for as long as you are in good health. Consult with your health care provider.  Pap test.** / Every 3 years starting at age 78 years through age 68 or 68 years with a history of 3 consecutive normal Pap tests.  HPV screening.** / Every 3 years  from ages 5 years through ages 77 to 64 years with a history of 3 consecutive normal Pap tests.  Fecal occult blood test (FOBT) of stool. / Every year beginning at age 42 years and continuing until age 61 years. You may not need to do this test if you get a colonoscopy every 10 years.  Flexible sigmoidoscopy or colonoscopy.** / Every 5 years for a flexible sigmoidoscopy or every 10 years for a colonoscopy beginning at age 44 years and continuing until age 98 years.  Hepatitis C blood test.** / For all people born from [birth year] through 1967 and any individual with known risks for hepatitis C.  Skin self-exam. / Monthly.  Influenza vaccine. / Every year.  Tetanus, diphtheria, and acellular pertussis (Tdap/Td) vaccine.** / Consult your health care provider. Pregnant women should receive 1 dose of Tdap vaccine during each pregnancy. 1 dose of Td every 10 years.  Varicella vaccine.** / Consult your health care provider. Pregnant females who do not have evidence of immunity should receive the first dose after pregnancy.  Zoster vaccine.** / 1 dose for adults aged 6 years or older.  Measles, mumps, rubella (MMR) vaccine.** / You need at least 1 dose of MMR if you were born in 1957 or later. You may also need a second dose. For females of childbearing age, rubella immunity should be determined. If there is no evidence of immunity, females who are not pregnant should be vaccinated. If there is no evidence of immunity,  females who are pregnant should delay immunization until after pregnancy.  Pneumococcal 13-valent conjugate (PCV13) vaccine.** / Consult your health care provider.  Pneumococcal polysaccharide (PPSV23) vaccine.** / 1 to 2 doses if you smoke cigarettes or if you have certain conditions.  Meningococcal vaccine.** / Consult your health care provider.  Hepatitis A vaccine.** / Consult your health care provider.  Hepatitis B vaccine.** / Consult your health care provider.  Haemophilus  influenzae type b (Hib) vaccine.** / Consult your health care provider. Ages 10 years and over  Blood pressure check.** / Every year.  Lipid and cholesterol check.** / Every 5 years beginning at age 51 years.  Lung cancer screening. / Every year if you are aged 35-80 years and have a 30-pack-year history of smoking and currently smoke or have quit within the past 15 years. Yearly screening is stopped once you have quit smoking for at least 15 years or develop a health problem that would prevent you from having lung cancer treatment.  Clinical breast exam.** / Every year after age 20 years.  BRCA-related cancer risk assessment.** / For women who have family members with a BRCA-related cancer (breast, ovarian, tubal, or peritoneal cancers).  Mammogram.** / Every year beginning at age 53 years and continuing for as long as you are in good health. Consult with your health care provider.  Pap test.** / Every 3 years starting at age 78 years through age 15 or 57 years with 3 consecutive normal Pap tests. Testing can be stopped between 65 and 70 years with 3 consecutive normal Pap tests and no abnormal Pap or HPV tests in the past 10 years.  HPV screening.** / Every 3 years from ages 68 years through ages 80 or 26 years with a history of 3 consecutive normal Pap tests. Testing can be stopped between 65 and 70 years with 3 consecutive normal Pap tests and no abnormal Pap or HPV tests in the past 10 years.  Fecal occult blood test (FOBT) of stool. / Every year beginning at age 29 years and continuing until age 29 years. You may not need to do this test if you get a colonoscopy every 10 years.  Flexible sigmoidoscopy or colonoscopy.** / Every 5 years for a flexible sigmoidoscopy or every 10 years for a colonoscopy beginning at age 54 years and continuing until age 42 years.  Hepatitis C blood test.** / For all people born from [birth year] through 1967 and any individual with known risks for hepatitis  C.  Osteoporosis screening.** / A one-time screening for women ages 83 years and over and women at risk for fractures or osteoporosis.  Skin self-exam. / Monthly.  Influenza vaccine. / Every year.  Tetanus, diphtheria, and acellular pertussis (Tdap/Td) vaccine.** / 1 dose of Td every 10 years.  Varicella vaccine.** / Consult your health care provider.  Zoster vaccine.** / 1 dose for adults aged 5 years or older.  Pneumococcal 13-valent conjugate (PCV13) vaccine.** / Consult your health care provider.  Pneumococcal polysaccharide (PPSV23) vaccine.** / 1 dose for all adults aged 40 years and older.  Meningococcal vaccine.** / Consult your health care provider.  Hepatitis A vaccine.** / Consult your health care provider.  Hepatitis B vaccine.** / Consult your health care provider.  Haemophilus influenzae type b (Hib) vaccine.** / Consult your health care provider. ** Family history and personal history of risk and conditions may change your health care provider's recommendations.   This information is not intended to replace advice given to you by your  health care provider. Make sure you discuss any questions you have with your health care provider.   Document Released: 11/09/2001 Document Revised: 10/04/2014 Document Reviewed: 02/08/2011 Elsevier Interactive Patient Education [employer].

## 2016-02-09 NOTE — Assessment & Plan Note (Signed)
Given Prevnar and menningococcal vaccine

## 2016-02-09 NOTE — Progress Notes (Signed)
Subjective:    Patient ID: Annette Snow, female    DOB: Mar 31, 1961, 55 y.o.   MRN: [id number]  Chief Complaint  Patient presents with  . Annual Exam    HPI Patient is in today for Annual Exam.  Patient reports with some concerns with itchy rash on left side of cheek and previously had some on the right cheek that has resolved, had a previous diagnosis of rosacea about 10 yrs ago.  Patient has seen a dermatologist in January and had basal cell skin cancer removed from right leg.   Patient was seeing Dr. Renda Rolls in the past for her rosacea.  In January patient broke ligaments in both wrist, and has some numbness from arm to wrist area but is seeing Dr. Oneta Rack follow up with and do therapy for hands. Patient has a Ob gyn she goes to for pap smears.  Denies CP/palp/SOB/HA/congestion/fevers/GI or GU c/o. Taking meds as prescribed.  Past Medical History  Diagnosis Date  . Ulcer 1984    petic and duodenal  . IBS (irritable bowel syndrome)   . Migraines   . Hemorrhoids   . Blood transfusion   . 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 Neuropathy, sacral plexus 09/13/2011  . Osgood-Schlatter's disease 09/13/2011  . Endometriosis 09/13/2011  . Abdominal pain 09/13/2011  . Foot pain, right 09/13/2011  . Adenomatous colon polyp   . IBS (irritable bowel syndrome)   . ECG abnormal 11/19/2011  . Nausea without vomiting 07/28/2014  . Rosacea 02/15/2016  . Preventative health care 02/15/2016    Past Surgical History  Procedure Laterality Date  . Laparoscopic hysterectomy  2000    total for extensive endometriosis  . Bunionectomy      bilateral  . Ankle reconstruction      left @ age 62  . Knee arthroscopy      bilateral  3 on left 2 on right  . Breast enhancement surgery    . Wrist reconstruction      left twice  . Foot surgery      left twice  . Tonsillectomy and adenoidectomy    . Laparoscopy      multiple times for endometriosis on all organs  . Abdominal hysterectomy    . Splenectomy   07/01/11  . Metatarsal osteotomy  11/24/2011    Procedure: METATARSAL OSTEOTOMY;  Surgeon: Wallene Huh, DPM;  Location: [facility];  Service: Podiatry;  Laterality: Right;  RIGHT AIKENS OSTEOTOMY; RIGHT METATARSAL OSTETOMY Second & Third WITH SCREW; Right Second arthroplasty lesser metatarsalphalangeal with total implant, FUSION Third TOE RIGHT  . Eye surgery  01/23/2012    cosmetic lift, eyelids, b/l    Family History  Problem Relation Age of Onset  . Alzheimer's disease Maternal Grandmother   . Heart attack Maternal Grandfather   . Leukemia Paternal Grandfather   . Liver disease Sister   . Cirrhosis Sister     primary biliary cirrhosis  . Kidney disease Sister   . Cancer Sister     breast  . Heart disease Father     rheumatic heart disease    Social History   Social History  . Marital Status: Single    Spouse Name: N/A  . Number of Children: N/A  . Years of Education: N/A   Occupational History  . Not on file.   Social History Main Topics  . Smoking status: Former Smoker -- 0.00 packs/day    Quit date: 11/16/2008  . Smokeless tobacco: Never Used  .  Alcohol Use: 3.6 oz/week    6 Cans of beer per week  . Drug Use: Yes    Special: Marijuana     Comment: for pain  . Sexual Activity: No   Other Topics Concern  . 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     Outpatient Prescriptions Prior to Visit  Medication Sig Dispense Refill  . albuterol (PROVENTIL HFA;VENTOLIN HFA) 108 (90 BASE) MCG/ACT inhaler Inhale 2 puffs into the lungs every 6 (six) hours as needed for wheezing or shortness of breath. 1 Inhaler 4  . famciclovir (FAMVIR) 500 MG tablet Take 3 tablets at onset of symptoms.  Do not take more that 3 tablets per outbreak. 15 tablet 0  . fluticasone (FLONASE) 50 MCG/ACT nasal spray Place 2 sprays into both nostrils daily as needed for allergies or rhinitis. 16 g 6  . Green Tea,  Camillia sinensis, (GREEN TEA EXTRACT PO) Take 1 capsule by mouth daily.    Marland Kitchen LATISSE 0.03 % ophthalmic solution   11  . omega-3 acid ethyl esters (LOVAZA) 1 G capsule Take 1 g by mouth daily.    Marland Kitchen OVER THE COUNTER MEDICATION Hyloronic Acid- 2 capsules daily    . VIVELLE-DOT 0.1 MG/24HR Place 1 patch onto the skin every 3 (three) days.      No facility-administered medications prior to visit.    Allergies  Allergen Reactions  . Demerol [Meperidine] Nausea And Vomiting  . Nucynta [Tapentadol Hydrochloride] Itching  . Adhesive [Tape] Rash    Review of Systems  Constitutional: Negative for fever and malaise/fatigue.  HENT: Negative for congestion.   Eyes: Negative for blurred vision.  Respiratory: Negative for shortness of breath.   Cardiovascular: Negative for chest pain, palpitations and leg swelling.  Gastrointestinal: Negative for nausea, abdominal pain and blood in stool.  Genitourinary: Negative for dysuria and frequency.  Musculoskeletal: Negative for falls.  Skin: Positive for itching and rash.  Neurological: Negative for dizziness, loss of consciousness and headaches.  Endo/Heme/Allergies: Negative for environmental allergies.  Psychiatric/Behavioral: Negative for depression. The patient is not nervous/anxious.        Objective:    Physical Exam  Constitutional: She is oriented to person, place, and time. She appears well-developed and well-nourished. No distress.  HENT:  Head: Normocephalic and atraumatic.  Eyes: Conjunctivae are normal.  Neck: Neck supple. No thyromegaly present.  Cardiovascular: Normal rate, regular rhythm and normal heart sounds.   No murmur heard. Pulmonary/Chest: Effort normal and breath sounds normal. No respiratory distress.  Abdominal: Soft. Bowel sounds are normal. She exhibits no distension and no mass. There is no tenderness.  Musculoskeletal: She exhibits no edema.  Lymphadenopathy:    She has no cervical adenopathy.  Neurological:  She is alert and oriented to person, place, and time.  Skin: Skin is warm and dry.  Psychiatric: She has a normal mood and affect. Her behavior is normal.    BP 94/64 mmHg  Pulse 71  Temp(Src) 98.1 F (36.7 C) (Oral)  Ht _0  (1.676 m)  Wt 128 lb 4 oz (58.174 kg)  BMI 20.71 kg/m2  SpO2 98% Wt Readings from Last 3 Encounters:  02/09/16 128 lb 4 oz (58.174 kg)  03/27/15 133 lb 3 oz (60.413 kg)  01/06/15 135 lb (61.236 kg)     Lab Results  Component Value Date   WBC 8.9 02/09/2016   HGB 13.8 02/09/2016   HCT  41.3 02/09/2016   PLT 241.0 02/09/2016   GLUCOSE 90 02/09/2016   CHOL 208* 02/09/2016   TRIG 49.0 02/09/2016   HDL 85.20 02/09/2016   LDLCALC 113* 02/09/2016   ALT 15 02/09/2016   AST 21 02/09/2016   NA 136 02/09/2016   K 3.9 02/09/2016   CL 100 02/09/2016   CREATININE 0.70 02/09/2016   BUN 10 02/09/2016   CO2 29 02/09/2016   TSH 0.61 02/09/2016   INR 0.9 07/25/2014    Lab Results  Component Value Date   TSH 0.61 02/09/2016   Lab Results  Component Value Date   WBC 8.9 02/09/2016   HGB 13.8 02/09/2016   HCT 41.3 02/09/2016   MCV 94.4 02/09/2016   PLT 241.0 02/09/2016   Lab Results  Component Value Date   NA 136 02/09/2016   K 3.9 02/09/2016   CO2 29 02/09/2016   GLUCOSE 90 02/09/2016   BUN 10 02/09/2016   CREATININE 0.70 02/09/2016   BILITOT 1.1 02/09/2016   ALKPHOS 41 02/09/2016   AST 21 02/09/2016   ALT 15 02/09/2016   PROT 6.8 02/09/2016   ALBUMIN 4.3 02/09/2016   CALCIUM 9.3 02/09/2016   GFR 91.65 02/09/2016   Lab Results  Component Value Date   CHOL 208* 02/09/2016   Lab Results  Component Value Date   HDL 85.20 02/09/2016   Lab Results  Component Value Date   LDLCALC 113* 02/09/2016   Lab Results  Component Value Date   TRIG 49.0 02/09/2016   Lab Results  Component Value Date   CHOLHDL 2 02/09/2016   No results found for: HGBA1C     Assessment & Plan:   Problem List Items Addressed This Visit    Rosacea     Patient reports h/o similar symptoms years ago and now had some flushing episodes on her cheeks recently none today. They feel flushed and itchy. Try Metrogel prn      Preventative health care    Patient encouraged to maintain heart healthy diet, regular exercise, adequate sleep. Consider daily probiotics. Take medications as prescribed. Given and reviewed copy of ACP documents from [facility] Secretary of State and encouraged to complete and return      Relevant Medications   metroNIDAZOLE (METROGEL) 1 % gel   Other Relevant Orders   Ambulatory referral to Dermatology   CBC (Completed)   TSH (Completed)   Lipid panel (Completed)   Comp Met (CMET) (Completed)   IBS (irritable bowel syndrome)    Is        Other Visit Diagnoses    Need for Menactra vaccination    -  Primary    Relevant Orders    Meningococcal polysaccharide vaccine subcutaneous (Completed)    Recurrent skin cancer        Relevant Medications    metroNIDAZOLE (METROGEL) 1 % gel    Other Relevant Orders    Ambulatory referral to Dermatology    CBC (Completed)    TSH (Completed)    Lipid panel (Completed)    Comp Met (CMET) (Completed)    Hx of skin cancer, basal cell        Relevant Medications    metroNIDAZOLE (METROGEL) 1 % gel    Other Relevant Orders    Ambulatory referral to Dermatology    CBC (Completed)    TSH (Completed)    Lipid panel (Completed)    Comp Met (CMET) (Completed)    Need for vaccination with 13-polyvalent pneumococcal conjugate vaccine  Relevant Orders    Pneumococcal conjugate vaccine 13-valent IM (Completed)    Need for hepatitis C screening test        Relevant Orders    Hepatitis C Antibody (Completed)    Screening for HIV (human immunodeficiency virus)        Relevant Orders    HIV antibody (with reflex) (Completed)    Need for meningococcal vaccination        Relevant Orders    Meningococcal polysaccharide vaccine subcutaneous (Completed)       I have discontinued Ms.  Schroll's meningococcal polysaccharide. I am also having her start on metroNIDAZOLE. Additionally, I am having her maintain her VIVELLE-DOT, omega-3 acid ethyl esters, (Green Tea, Camillia sinensis, (GREEN TEA EXTRACT PO)), OVER THE COUNTER MEDICATION, LATISSE, fluticasone, albuterol, and famciclovir.  Meds ordered this encounter  Medications  . metroNIDAZOLE (METROGEL) 1 % gel    Sig: Apply topically daily.    Dispense:  45 g    Refill:  1  . DISCONTD: meningococcal polysaccharide (MENACTRA) injection    Sig: Inject 0.5 mLs into the muscle once.    Dispense:  0.5 mL    Refill:  0     [name], MD

## 2016-02-09 NOTE — Assessment & Plan Note (Signed)
New lesion just removed by Plastics, Dr Harlow Mares, reported as aggressive will request records and refer back to Dr [facility] dermatology.

## 2016-02-10 LAB — HEPATITIS C ANTIBODY: HCV AB: NEGATIVE

## 2016-02-15 ENCOUNTER — Encounter: Payer: Self-pay | Admitting: Family Medicine

## 2016-02-15 DIAGNOSIS — L719 Rosacea, unspecified: Secondary | ICD-10-CM

## 2016-02-15 DIAGNOSIS — Z0000 Encounter for general adult medical examination without abnormal findings: Secondary | ICD-10-CM | POA: Insufficient documentation

## 2016-02-15 HISTORY — DX: Rosacea, unspecified: L71.9

## 2016-02-15 HISTORY — DX: Encounter for general adult medical examination without abnormal findings: Z00.00

## 2016-02-15 NOTE — Assessment & Plan Note (Signed)
Patient encouraged to maintain heart healthy diet, regular exercise, adequate sleep. Consider daily probiotics. Take medications as prescribed. Given and reviewed copy of ACP documents from [place] Secretary of State and encouraged to complete and return 

## 2016-02-15 NOTE — Assessment & Plan Note (Addendum)
Patient reports h/o similar symptoms years ago and now had some flushing episodes on her cheeks recently none today. They feel flushed and itchy. Try Metrogel prn

## 2016-02-27 NOTE — Telephone Encounter (Signed)
Pre Visit call completed. 

## 2016-03-22 ENCOUNTER — Other Ambulatory Visit: Payer: Self-pay | Admitting: Plastic Surgery

## 2016-03-29 ENCOUNTER — Other Ambulatory Visit: Payer: Self-pay | Admitting: Family Medicine

## 2016-03-29 DIAGNOSIS — T7840XS Allergy, unspecified, sequela: Secondary | ICD-10-CM

## 2016-03-29 MED ORDER — FLUTICASONE PROPIONATE 50 MCG/ACT NA SUSP: 2.0000 | Freq: Every day | NASAL | Status: DC | PRN

## 2016-04-06 ENCOUNTER — Ambulatory Visit (INDEPENDENT_AMBULATORY_CARE_PROVIDER_SITE_OTHER): Payer: PRIVATE HEALTH INSURANCE | Admitting: *Deleted

## 2016-04-06 DIAGNOSIS — Z23 Encounter for immunization: Secondary | ICD-10-CM

## 2016-04-06 NOTE — Progress Notes (Signed)
Pre visit review using our clinic review tool, if applicable. No additional management support is needed unless otherwise documented below in the visit note.  Patient tolerated injection well.  Yalitza Teed J Vander Kueker, RN 

## 2016-04-08 ENCOUNTER — Telehealth: Payer: Self-pay | Admitting: *Deleted

## 2016-04-08 NOTE — Telephone Encounter (Signed)
Called pt and left detailed message to call the office to schedule nurse visit to start Men B and Hib vaccines. She will need 1 dose of Hib, and 2 doses of Men B (Bexsero) w/ the second dose at least 1 month after the first dose.

## 2016-04-08 NOTE — Telephone Encounter (Signed)
-----   Message from Mosie Lukes, MD sent at 04/07/2016  5:34 PM EDT ----- Regarding: RE: Immunizations Thanks for all the hard work. I had printed some info so we could discuss yesterday and we never had a free minute. Yes start the Men B in a month and I believe I discussed HIB with patient so I believe she will agree and can start that as well. Dr B ----- Message -----    From: Dorrene German, RN    Sent: 04/07/2016   8:51 AM      To: Mosie Lukes, MD Subject: RE: Immunizations                              See immunization record below. It looks like her 1st meningococcal was incorrectly documented as meningococcal polysaccharide, which we do not carry, so she likely received Menveo as intended. Unfortunately the label was not placed on the vaccine order for scanning, so I cannot verify.   Meningococcal Conjugate 04/06/2016, 07/09/2011        Meningococcal Polysaccharide 02/09/2016   She is UTD w/ PNA and TDAP. 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 Would you like me to schedule nurse visit to start Men B series? Okay to start in about 30 days since she just received PPSV 23 and 2nd Menveo? Looks like Hib may also be recommended based on CDC recommendations for pt's w/ asplenia? See page 3 of link below.  [email or web address]   Thanks, Hoyle Sauer   ----- Message -----    From: Mosie Lukes, MD    Sent: 04/06/2016  12:49 PM      To: Dorrene German, RN Subject: RE: Immunizations                              She is all set with pneumonia shots but may need boosters on meningitis shots. We should review which ones she got so we can come up with a schedule. Dr B ----- Message -----    From: Dorrene German, RN    Sent: 04/06/2016   9:43 AM      To: Mosie Lukes, MD Subject: Immunizations                                  Pt in today for PPSV-23 and Menveo vaccines. She  thought she was supposed to come back in 30 days for more vaccinations. I do not see anything documented. Please advise if any additional vaccines are needed and when if so.   Thanks, Hoyle Sauer

## 2016-05-03 ENCOUNTER — Encounter: Payer: Self-pay | Admitting: Gastroenterology

## 2016-05-11 ENCOUNTER — Ambulatory Visit (INDEPENDENT_AMBULATORY_CARE_PROVIDER_SITE_OTHER): Payer: PRIVATE HEALTH INSURANCE | Admitting: *Deleted

## 2016-05-11 DIAGNOSIS — Z23 Encounter for immunization: Secondary | ICD-10-CM

## 2016-05-11 NOTE — Progress Notes (Signed)
Pre visit review using our clinic review tool, if applicable. No additional management support is needed unless otherwise documented below in the visit note.  Pt here for MenB and Hib vaccine per Dr. Charlett Blake (see phone notes). Patient tolerated injection well.  Dorrene German, RN

## 2016-07-13 ENCOUNTER — Ambulatory Visit (INDEPENDENT_AMBULATORY_CARE_PROVIDER_SITE_OTHER): Payer: PRIVATE HEALTH INSURANCE | Admitting: Behavioral Health

## 2016-07-13 DIAGNOSIS — Z23 Encounter for immunization: Secondary | ICD-10-CM

## 2016-07-13 NOTE — Progress Notes (Signed)
Pre visit review using our clinic review tool, if applicable. No additional management support is needed unless otherwise documented below in the visit note.  Patient in clinic today for Men B vaccination (2nd). IM given in Left Deltoid. Patient tolerated injection well.

## 2016-08-08 ENCOUNTER — Encounter (HOSPITAL_BASED_OUTPATIENT_CLINIC_OR_DEPARTMENT_OTHER): Payer: Self-pay | Admitting: *Deleted

## 2016-08-08 ENCOUNTER — Emergency Department (HOSPITAL_BASED_OUTPATIENT_CLINIC_OR_DEPARTMENT_OTHER)
Admission: EM | Admit: 2016-08-08 | Discharge: 2016-08-08 | Disposition: A | Discharge status: Home / Self Care | Payer: 59 | Attending: Emergency Medicine | Admitting: Emergency Medicine

## 2016-08-08 ENCOUNTER — Emergency Department (HOSPITAL_BASED_OUTPATIENT_CLINIC_OR_DEPARTMENT_OTHER): Payer: 59

## 2016-08-08 DIAGNOSIS — M7989 Other specified soft tissue disorders: Secondary | ICD-10-CM | POA: Insufficient documentation

## 2016-08-08 DIAGNOSIS — Z87891 Personal history of nicotine dependence: Secondary | ICD-10-CM | POA: Diagnosis not present

## 2016-08-08 NOTE — ED Triage Notes (Signed)
Pt c/o left lower leg pain and swelling x 2 days.

## 2016-08-08 NOTE — ED Notes (Signed)
Awaiting US.

## 2016-08-08 NOTE — ED Notes (Signed)
Back from US.

## 2016-08-08 NOTE — ED Notes (Signed)
Patient transported to Ultrasound 

## 2016-08-08 NOTE — ED Provider Notes (Signed)
[place] [facility] Provider Note   CSN: [id number] Arrival date & time: 08/08/16  1118     History   Chief Complaint Chief Complaint  Patient presents with  . Leg Swelling    HPI Annette Snow is a 55 y.o. female.  55yo F w/ PMH below who p/w L leg swelling and pain. 2-3 days ago, the patient began having pain and swelling in her left lower leg starting at her knees down to her ankle. She has not had any injury or trauma to her leg. She has been trying elevation, rest, and compression stockings with only mild relief of the swelling. She denies any fevers or new rash on her leg. No recent illness. No long travel, history of blood clots, or history of cancer. No family history of blood clots. She does report history of estrogen use. No chest pain or SOB.   The history is provided by the patient.    Past Medical History:  Diagnosis Date  . Abdominal pain 09/13/2011  . Adenomatous colon polyp   . Blood transfusion   . ECG abnormal 11/19/2011  . Endometriosis 09/13/2011  . Foot pain, right 09/13/2011  . Hemorrhoids   . IBS (irritable bowel syndrome)   . IBS (irritable bowel syndrome)   . Migraines   . Nausea without vomiting 07/28/2014  . Neuropathy, sacral plexus 09/13/2011  . Osgood-Schlatter's disease 09/13/2011  . Preventative health care 02/15/2016  . Rosacea 02/15/2016  . 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 Ulcer ([place]) 1984   petic and duodenal    Patient Active Problem List   Diagnosis Date Noted  . Rosacea 02/15/2016  . Preventative health care 02/15/2016  . Allergic state 01/12/2015  . Abnormal bruising 07/28/2014  . Nausea without vomiting 07/28/2014  . Fatigue 06/16/2014  . Cellulitis 06/14/2014  . HSV infection 04/08/2014  . BCC (basal cell carcinoma), leg 04/08/2014  . Nodule of neck 01/24/2013  . Otitis externa 02/02/2012  . ECG abnormal 11/19/2011  . Neuropathy, sacral plexus 09/13/2011  . Osgood-Schlatter's disease 09/13/2011  . Endometriosis  09/13/2011  . Ganglion cyst of wrist 09/13/2011  . Abdominal pain 09/13/2011  . Foot pain, right 09/13/2011  . 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 IBS (irritable bowel syndrome) 09/08/2011    Past Surgical History:  Procedure Laterality Date  . ABDOMINAL HYSTERECTOMY    . ANKLE RECONSTRUCTION     left @ age 27  . BREAST ENHANCEMENT SURGERY    . BUNIONECTOMY     bilateral  . EYE SURGERY  01/23/2012   cosmetic lift, eyelids, b/l  . FOOT SURGERY     left twice  . KNEE ARTHROSCOPY     bilateral  3 on left 2 on right  . LAPAROSCOPIC HYSTERECTOMY  2000   total for extensive endometriosis  . LAPAROSCOPY     multiple times for endometriosis on all organs  . METATARSAL OSTEOTOMY  11/24/2011   Procedure: METATARSAL OSTEOTOMY;  Surgeon: Wallene Huh, DPM;  Location: [facility];  Service: Podiatry;  Laterality: Right;  RIGHT AIKENS OSTEOTOMY; RIGHT METATARSAL OSTETOMY Second & Third WITH SCREW; Right Second arthroplasty lesser metatarsalphalangeal with total implant, FUSION Third TOE RIGHT  . SPLENECTOMY  07/01/11  . TONSILLECTOMY AND ADENOIDECTOMY    . WRIST RECONSTRUCTION     left twice    OB History    No data available       Home Medications    Prior to Admission medications   Medication Sig Start Date  End Date Taking? Authorizing Provider  albuterol (PROVENTIL HFA;VENTOLIN HFA) 108 (90 BASE) MCG/ACT inhaler Inhale 2 puffs into the lungs every 6 (six) hours as needed for wheezing or shortness of breath. 08/12/15   Mosie Lukes, MD  famciclovir (FAMVIR) 500 MG tablet Take 3 tablets at onset of symptoms.  Do not take more that 3 tablets per outbreak. 08/12/15   Mosie Lukes, MD  fluticasone (FLONASE) 50 MCG/ACT nasal spray Place 2 sprays into both nostrils daily as needed for allergies or rhinitis. 03/29/16   Mosie Lukes, MD  Green Tea, Camillia sinensis, (GREEN TEA EXTRACT PO) Take 1 capsule by mouth daily.    Historical Provider, MD  LATISSE 0.03 % ophthalmic  solution  12/09/14   Historical Provider, MD  omega-3 acid ethyl esters (LOVAZA) 1 G capsule Take 1 g by mouth daily.    Historical Provider, MD  OVER THE COUNTER MEDICATION Hyloronic Acid- 2 capsules daily    Historical Provider, MD  VIVELLE-DOT 0.1 MG/24HR Place 1 patch onto the skin every 3 (three) days.  06/01/11   Historical Provider, MD    Family History Family History  Problem Relation Age of Onset  . Alzheimer's disease Maternal Grandmother   . Heart attack Maternal Grandfather   . Leukemia Paternal Grandfather   . Liver disease Sister   . Cirrhosis Sister     primary biliary cirrhosis  . Kidney disease Sister   . Cancer Sister     breast  . Heart disease Father     rheumatic heart disease    Social History Social History  Substance Use Topics  . Smoking status: Former Smoker    Packs/day: 0.00    Quit date: 11/16/2008  . Smokeless tobacco: Never Used  . Alcohol use 3.6 oz/week    6 Cans of beer per week     Allergies   Demerol [meperidine]; Nucynta [tapentadol hydrochloride]; and Adhesive [tape]   Review of Systems Review of Systems 10 Systems reviewed and are negative for acute change except as noted in the HPI.   Physical Exam Updated Vital Signs BP 101/72 (BP Location: Right Arm)   Pulse (!) 58   Temp 97.6 F (36.4 C)   Resp 18   Ht 5\' 6"  (1.676 m)   Wt 128 lb (58.1 kg)   SpO2 98%   BMI 20.66 kg/m   Physical Exam  Constitutional: She is oriented to person, place, and time. She appears well-developed and well-nourished. No distress.  HENT:  Head: Normocephalic and atraumatic.  Moist mucous membranes  Eyes: Conjunctivae are normal. Pupils are equal, round, and reactive to light.  Neck: Neck supple.  Cardiovascular: Normal rate, regular rhythm and normal heart sounds.   No murmur heard. Pulmonary/Chest: Effort normal and breath sounds normal.  Abdominal: Soft. Bowel sounds are normal. She exhibits no distension. There is no tenderness.    Musculoskeletal: She exhibits edema.  Mild edema of distal L lower leg near ankle; mild tenderness of L calf; normal strength and sensation LLE  Neurological: She is alert and oriented to person, place, and time. No sensory deficit.  Fluent speech  Skin: Skin is warm and dry. Capillary refill takes less than 2 seconds. No rash noted. No pallor.  Psychiatric: She has a normal mood and affect. Judgment normal.  Nursing note and vitals reviewed.    ED Treatments / Results  Labs (all labs ordered are listed, but only abnormal results are displayed) Labs Reviewed - No data to display  EKG  EKG Interpretation None       Radiology US Venous Img Lower Unilateral Left  Result Date: 08/08/2016 CLINICAL DATA:  Left lower extremity pain and edema. EXAM: LEFT LOWER EXTREMITY VENOUS DOPPLER ULTRASOUND TECHNIQUE: Gray-scale sonography with graded compression, as well as color Doppler and duplex ultrasound were performed to evaluate the lower extremity deep venous systems from the level of the common femoral vein and including the common femoral, femoral, profunda femoral, popliteal and calf veins including the posterior tibial, peroneal and gastrocnemius veins when visible. The superficial great saphenous vein was also interrogated. Spectral Doppler was utilized to evaluate flow at rest and with distal augmentation maneuvers in the common femoral, femoral and popliteal veins. COMPARISON:  None. FINDINGS: Contralateral Common Femoral Vein: Respiratory phasicity is normal and symmetric with the symptomatic side. No evidence of thrombus. Normal compressibility. Common Femoral Vein: No evidence of thrombus. Normal compressibility, respiratory phasicity and response to augmentation. Saphenofemoral Junction: No evidence of thrombus. Normal compressibility and flow on color Doppler imaging. Profunda Femoral Vein: No evidence of thrombus. Normal compressibility and flow on color Doppler imaging. Femoral Vein:  No evidence of thrombus. Normal compressibility, respiratory phasicity and response to augmentation. Popliteal Vein: No evidence of thrombus. Normal compressibility, respiratory phasicity and response to augmentation. Calf Veins: No evidence of thrombus. Normal compressibility and flow on color Doppler imaging. Superficial Great Saphenous Vein: Diminutive left great saphenous vein. This may be developmental or reflective of prior stripping or endo venous ablation. Venous Reflux:  None. Other Findings: No evidence of superficial thrombophlebitis or abnormal fluid collection. IMPRESSION: No evidence of left lower extremity deep venous thrombosis. Electronically Signed   By: Aletta Edouard M.D.   On: 08/08/2016 14:32    Procedures Procedures (including critical care time)  Medications Ordered in ED Medications - No data to display   Initial Impression / Assessment and Plan / ED Course  I have reviewed the triage vital signs and the nursing notes.  Pertinent imaging results that were available during my care of the patient were reviewed by me and considered in my medical decision making (see chart for details).  PT w/ a few days of atraumatic L lower leg swelling. No Infectious symptoms or skin changes. She was well-appearing on exam with normal vital signs. No complaints of chest pain or shortness of breath.  Given no trauma or deformity on exam, I do not feel she needs XR. No redness or skin changes to suggest infection.   [place] negative for DVT. Pt Otherwise well-appearing with no other complaints. Because of unilateral swelling, I recommended a repeat ultrasound with her PCP in 5 days and patient states that she will be able to do this. Reviewed return precautions including any skin changes, fever, chest pain or shortness of breath. Reviewed supportive care including compression, elevation, and ice. Patient voiced understanding and was discharged in satisfactory condition.  Final Clinical  Impressions(s) / ED Diagnoses   Final diagnoses:  Left leg swelling    New Prescriptions New Prescriptions   No medications on file     Sharlett Iles, MD 08/08/16 1451

## 2016-08-08 NOTE — ED Notes (Signed)
States woke up Friday with swelling and pain to left lower leg. Denies recent injury or long distance travel. Pain worse yesterday, to left calf and ant lower leg/shin area

## 2016-08-10 ENCOUNTER — Telehealth: Payer: Self-pay | Admitting: Family Medicine

## 2016-08-10 NOTE — Telephone Encounter (Signed)
Patient is calling because she was seen on Sunday at the ED for a swollen leg. She states that her leg started to swell out of no where on Saturday and now the leg is starting to bruise from the calf down to her ankle. An ultrasound was done at the ED but they didn't give any indication as to what it is. They recommended a follow up with her PCP. She would really like to get in with Dr. Charlett Blake because this is not normal for her. She would not mind seeing another provider any other time but this time she would like to see Dr. Charlett Blake and would feel more comfortable with her. Please advise.    Patient phone: [phone]

## 2016-08-10 NOTE — Telephone Encounter (Signed)
After speaking to PCP she stated ok for any of the same day/acutes appts for this Thursday 08/12/16--will forward message to [facility] to schedule.

## 2016-08-12 ENCOUNTER — Ambulatory Visit (INDEPENDENT_AMBULATORY_CARE_PROVIDER_SITE_OTHER): Payer: PRIVATE HEALTH INSURANCE | Admitting: Family Medicine

## 2016-08-12 ENCOUNTER — Ambulatory Visit (HOSPITAL_BASED_OUTPATIENT_CLINIC_OR_DEPARTMENT_OTHER)
Admission: RE | Admit: 2016-08-12 | Discharge: 2016-08-12 | Disposition: A | Discharge status: Home / Self Care | Payer: 59 | Source: Ambulatory Visit | Attending: Family Medicine | Admitting: Family Medicine

## 2016-08-12 ENCOUNTER — Encounter: Payer: Self-pay | Admitting: Family Medicine

## 2016-08-12 VITALS — BP 102/62 | HR 77 | Temp 98.1°F | Ht 66.0 in | Wt 132.5 lb

## 2016-08-12 DIAGNOSIS — M79662 Pain in left lower leg: Secondary | ICD-10-CM | POA: Insufficient documentation

## 2016-08-12 DIAGNOSIS — M25462 Effusion, left knee: Secondary | ICD-10-CM | POA: Diagnosis not present

## 2016-08-12 DIAGNOSIS — R238 Other skin changes: Secondary | ICD-10-CM

## 2016-08-12 DIAGNOSIS — R600 Localized edema: Secondary | ICD-10-CM

## 2016-08-12 DIAGNOSIS — R233 Spontaneous ecchymoses: Secondary | ICD-10-CM

## 2016-08-12 DIAGNOSIS — M25569 Pain in unspecified knee: Secondary | ICD-10-CM | POA: Diagnosis not present

## 2016-08-12 DIAGNOSIS — Z86718 Personal history of other venous thrombosis and embolism: Secondary | ICD-10-CM

## 2016-08-12 DIAGNOSIS — M7122 Synovial cyst of popliteal space [Baker], left knee: Secondary | ICD-10-CM | POA: Insufficient documentation

## 2016-08-12 LAB — CBC WITH DIFFERENTIAL/PLATELET
BASOS PCT: 1 % (ref 0.0–3.0)
Basophils Absolute: 0.1 10*3/uL (ref 0.0–0.1)
EOS PCT: 3.4 % (ref 0.0–5.0)
Eosinophils Absolute: 0.2 10*3/uL (ref 0.0–0.7)
HEMATOCRIT: 39.6 % (ref 36.0–46.0)
Hemoglobin: 13.3 g/dL (ref 12.0–15.0)
LYMPHS ABS: 2.6 10*3/uL (ref 0.7–4.0)
Lymphocytes Relative: 36.6 % (ref 12.0–46.0)
MCHC: 33.4 g/dL (ref 30.0–36.0)
MCV: 94.9 fl (ref 78.0–100.0)
MONOS PCT: 7.7 % (ref 3.0–12.0)
Monocytes Absolute: 0.5 10*3/uL (ref 0.1–1.0)
NEUTROS ABS: 3.6 10*3/uL (ref 1.4–7.7)
NEUTROS PCT: 51.3 % (ref 43.0–77.0)
PLATELETS: 261 10*3/uL (ref 150.0–400.0)
RBC: 4.18 Mil/uL (ref 3.87–5.11)
RDW: 13.6 % (ref 11.5–15.5)
WBC: 7 10*3/uL (ref 4.0–10.5)

## 2016-08-12 LAB — COMPREHENSIVE METABOLIC PANEL
ALBUMIN: 4 g/dL (ref 3.5–5.2)
ALT: 12 U/L (ref 0–35)
AST: 16 U/L (ref 0–37)
Alkaline Phosphatase: 43 U/L (ref 39–117)
BUN: 14 mg/dL (ref 6–23)
CHLORIDE: 103 meq/L (ref 96–112)
CO2: 32 meq/L (ref 19–32)
CREATININE: 0.79 mg/dL (ref 0.40–1.20)
Calcium: 9.3 mg/dL (ref 8.4–10.5)
GFR: 79.57 mL/min (ref >60.00)
GLUCOSE: 82 mg/dL (ref 70–99)
Potassium: 4.6 mEq/L (ref 3.5–5.1)
SODIUM: 140 meq/L (ref 135–145)
Total Bilirubin: 0.8 mg/dL (ref 0.2–1.2)
Total Protein: 6.5 g/dL (ref 6.0–8.3)

## 2016-08-12 LAB — PROTIME-INR
INR: 1.1 ratio — AB (ref 0.8–1.0)
Prothrombin Time: 11.2 s (ref 9.6–13.1)

## 2016-08-12 MED ORDER — IOPAMIDOL (ISOVUE-370) INJECTION 76%
100.0000 mL | Freq: Once | INTRAVENOUS | Status: AC | PRN
  Administered 2016-08-12: 100 mL via INTRAVENOUS

## 2016-08-12 NOTE — Patient Instructions (Signed)
Deep Vein Thrombosis A deep vein thrombosis (DVT) is a blood clot (thrombus) that usually occurs in a deep, larger vein of the lower leg or the pelvis, or in an upper extremity such as the arm. These are dangerous and can lead to serious and even life-threatening complications if the clot travels to the lungs. A DVT can damage the valves in your leg veins so that instead of flowing upward, the blood pools in the lower leg. This is called post-thrombotic syndrome, and it can result in pain, swelling, discoloration, and sores on the leg. What are the causes? A DVT is caused by the formation of a blood clot in your leg, pelvis, or arm. Usually, several things contribute to the formation of blood clots. A clot may develop when:  Your blood flow slows down.  Your vein becomes damaged in some way.  You have a condition that makes your blood clot more easily.  What increases the risk? A DVT is more likely to develop in:  People who are older, especially over 60 years of age.  People who are overweight (obese).  People who sit or lie still for a long time, such as during long-distance travel (over 4 hours), bed rest, hospitalization, or during recovery from certain medical conditions like a stroke.  People who do not engage in much physical activity (sedentary lifestyle).  People who have chronic breathing disorders.  People who have a personal or family history of blood clots or blood clotting disease.  People who have peripheral vascular disease (PVD), diabetes, or some types of cancer.  People who have heart disease, especially if the person had a recent heart attack or has congestive heart failure.  People who have neurological diseases that affect the legs (leg paresis).  People who have had a traumatic injury, such as breaking a hip or leg.  People who have recently had major or lengthy surgery, especially on the hip, knee, or abdomen.  People who have had a central line placed  inside a large vein.  People who take medicines that contain the hormone estrogen. These include birth control pills and hormone replacement therapy.  Pregnancy or during childbirth or the postpartum period.  Long plane flights (over 8 hours).  What are the signs or symptoms?  Symptoms of a DVT can include:  Swelling of your leg or arm, especially if one side is much worse.  Warmth and redness of your leg or arm, especially if one side is much worse.  Pain in your arm or leg. If the clot is in your leg, symptoms may be more noticeable or worse when you stand or walk.  A feeling of pins and needles, if the clot is in the arm.  The symptoms of a DVT that has traveled to the lungs (pulmonary embolism, PE) usually start suddenly and include:  Shortness of breath while active or at rest.  Coughing or coughing up blood or blood-tinged mucus.  Chest pain that is often worse with deep breaths.  Rapid or irregular heartbeat.  Feeling light-headed or dizzy.  Fainting.  Feeling anxious.  Sweating.  There may also be pain and swelling in a leg if that is where the blood clot started. These symptoms may represent a serious problem that is an emergency. Do not wait to see if the symptoms will go away. Get medical help right away. Call your local emergency services (911 in the [place] Do not drive yourself to the hospital. How is this diagnosed? Your health   care provider will take a medical history and perform a physical exam. You may also have other tests, including:  Blood tests to assess the clotting properties of your blood.  Imaging tests, such as CT, ultrasound, MRI, X-ray, and other tests to see if you have clots anywhere in your body.  How is this treated? After a DVT is identified, it can be treated. The type of treatment that you receive depends on many factors, such as the cause of your DVT, your risk for bleeding or developing more clots, and other medical conditions that  you have. Sometimes, a combination of treatments is necessary. Treatment options may be combined and include:  Monitoring the blood clot with ultrasound.  Taking medicines by mouth, such as newer blood thinners (anticoagulants), thrombolytics, or warfarin.  Taking anticoagulant medicine by injection or through an IV tube.  Wearing compression stockings or using different types ofdevices.  Surgery (rare) to remove the blood clot or to place a filter in your abdomen to stop the blood clot from traveling to your lungs.  Treatments for a DVT are often divided into immediate treatment and long-term treatment (up to 3 months after DVT). You can work with your health care provider to choose the treatment program that is best for you. Follow these instructions at home: If you are taking a newer oral anticoagulant:  Take the medicine every single day at the same time each day.  Understand what foods and drugs interact with this medicine.  Understand that there are no regular blood tests required when using this medicine.  Understand the side effects of this medicine, including excessive bruising or bleeding. Ask your health care provider or pharmacist about other possible side effects. If you are taking warfarin:  Understand how to take warfarin and know which foods can affect how warfarin works in your body.  Understand that it is dangerous to take too much or too little warfarin. Too much warfarin increases the risk of bleeding. Too little warfarin continues to allow the risk for blood clots.  Follow your PT and INR blood testing schedule. The PT and INR results allow your health care provider to adjust your dose of warfarin. It is very important that you have your PT and INR tested as often as told by your health care provider.  Avoid major changes in your diet, or tell your health care provider before you change your diet. Arrange a visit with a registered dietitian to answer your  questions. Many foods, especially foods that are high in vitamin K, can interfere with warfarin and affect the PT and INR results. Eat a consistent amount of foods that are high in vitamin K, such as: ? Spinach, kale, broccoli, cabbage, collard greens, turnip greens, Brussels sprouts, peas, cauliflower, seaweed, and parsley. ? Beef liver and pork liver. ? Green tea. ? Soybean oil.  Tell your health care provider about any and all medicines, vitamins, and supplements that you take, including aspirin and other over-the-counter anti-inflammatory medicines. Be especially cautious with aspirin and anti-inflammatory medicines. Do not take those before you ask your health care provider if it is safe to do so. This is important because many medicines can interfere with warfarin and affect the PT and INR results.  Do not start or stop taking any over-the-counter or prescription medicine unless your health care provider or pharmacist tells you to do so. If you take warfarin, you will also need to do these things:  Hold pressure over cuts for longer than   usual.  Tell your dentist and other health care providers that you are taking warfarin before you have any procedures in which bleeding may occur.  Avoid alcohol or drink very small amounts. Tell your health care provider if you change your alcohol intake.  Do not use tobacco products, including cigarettes, chewing tobacco, and e-cigarettes. If you need help quitting, ask your health care provider.  Avoid contact sports.  General instructions  Take over-the-counter and prescription medicines only as told by your health care provider. Anticoagulant medicines can have side effects, including easy bruising and difficulty stopping bleeding. If you are prescribed an anticoagulant, you will also need to do these things: ? Hold pressure over cuts for longer than usual. ? Tell your dentist and other health care providers that you are taking anticoagulants  before you have any procedures in which bleeding may occur. ? Avoid contact sports.  Wear a medical alert bracelet or carry a medical alert card that says you have had a PE.  Ask your health care provider how soon you can go back to your normal activities. Stay active to prevent new blood clots from forming.  Make sure to exercise while traveling or when you have been sitting or standing for a long period of time. It is very important to exercise. Exercise your legs by walking or by tightening and relaxing your leg muscles often. Take frequent walks.  Wear compression stockings as told by your health care provider to help prevent more blood clots from forming.  Do not use tobacco products, including cigarettes, chewing tobacco, and e-cigarettes. If you need help quitting, ask your health care provider.  Keep all follow-up appointments with your health care provider. This is important. How is this prevented? Take these actions to decrease your risk of developing another DVT:  Exercise regularly. For at least 30 minutes every day, engage in: ? Activity that involves moving your arms and legs. ? Activity that encourages good blood flow through your body by increasing your heart rate.  Exercise your arms and legs every hour during long-distance travel (over 4 hours). Drink plenty of water and avoid drinking alcohol while traveling.  Avoid sitting or lying in bed for long periods of time without moving your legs.  Maintain a weight that is appropriate for your height. Ask your health care provider what weight is healthy for you.  If you are a woman who is over 35 years of age, avoid unnecessary use of medicines that contain estrogen. These include birth control pills.  Do not smoke, especially if you take estrogen medicines. If you need help quitting, ask your health care provider.  If you are hospitalized, prevention measures may include:  Early walking after surgery, as soon as your  health care provider says that it is safe.  Receiving anticoagulants to prevent blood clots.If you cannot take anticoagulants, other options may be available, such as wearing compression stockings or using different types of devices.  Get help right away if:  You have new or increased pain, swelling, or redness in an arm or leg.  You have numbness or tingling in an arm or leg.  You have shortness of breath while active or at rest.  You have chest pain.  You have a rapid or irregular heartbeat.  You feel light-headed or dizzy.  You cough up blood.  You notice blood in your vomit, bowel movement, or urine. These symptoms may represent a serious problem that is an emergency. Do not wait to see   if the symptoms will go away. Get medical help right away. Call your local emergency services (911 in the [place] Do not drive yourself to the hospital. This information is not intended to replace advice given to you by your health care provider. Make sure you discuss any questions you have with your health care provider. Document Released: 09/13/2005 Document Revised: 02/19/2016 Document Reviewed: 01/08/2015 Elsevier Interactive Patient Education  2017 [employer]  

## 2016-08-12 NOTE — Progress Notes (Signed)
Pre visit review using our clinic review tool, if applicable. No additional management support is needed unless otherwise documented below in the visit note. 

## 2016-08-13 ENCOUNTER — Other Ambulatory Visit: Payer: Self-pay | Admitting: Family Medicine

## 2016-08-13 DIAGNOSIS — M25562 Pain in left knee: Secondary | ICD-10-CM

## 2016-08-13 NOTE — Telephone Encounter (Signed)
Appointment was completed 08/12/16

## 2016-08-16 ENCOUNTER — Other Ambulatory Visit: Payer: Self-pay | Admitting: Plastic Surgery

## 2016-08-17 ENCOUNTER — Encounter: Payer: Self-pay | Admitting: Family Medicine

## 2016-08-17 ENCOUNTER — Other Ambulatory Visit: Payer: Self-pay | Admitting: Family Medicine

## 2016-08-17 ENCOUNTER — Ambulatory Visit (INDEPENDENT_AMBULATORY_CARE_PROVIDER_SITE_OTHER): Payer: PRIVATE HEALTH INSURANCE | Admitting: Family Medicine

## 2016-08-17 VITALS — BP 115/77 | HR 65 | Ht 66.0 in | Wt 129.0 lb

## 2016-08-17 DIAGNOSIS — M25562 Pain in left knee: Secondary | ICD-10-CM

## 2016-08-17 DIAGNOSIS — M25462 Effusion, left knee: Secondary | ICD-10-CM

## 2016-08-17 NOTE — Patient Instructions (Addendum)
We will contact you with your lab results. You were given an injection into this knee as well. Icing 15 minutes at a time 3-4 times a day. Compression sleeve or ACE wrap for support. Elevate above the level of your heart as much as possible. Aleve 2 tabs twice a day with food OR ibuprofen 600mg  three times a day with food for pain and inflammation if needed and you can take anti-inflammatories. Follow up as needed.

## 2016-08-18 DIAGNOSIS — M25462 Effusion, left knee: Secondary | ICD-10-CM | POA: Diagnosis not present

## 2016-08-18 LAB — SYNOVIAL CELL COUNT + DIFF, W/ CRYSTALS
BASOPHILS, %: 0 %
EOSINOPHILS-SYNOVIAL: 0 % (ref 0–2)
LYMPHOCYTES-SYNOVIAL FLD: 75 % — AB (ref 0–74)
Monocyte/Macrophage: 6 % (ref 0–69)
NEUTROPHIL, SYNOVIAL: 18 % (ref 0–24)
Synoviocytes, %: 1 % (ref 0–15)
WBC, SYNOVIAL: 750 {cells}/uL — AB (ref <150)

## 2016-08-18 MED ORDER — METHYLPREDNISOLONE ACETATE 40 MG/ML IJ SUSP
40.0000 mg | Freq: Once | INTRAMUSCULAR | Status: AC
  Administered 2016-08-18: 40 mg via INTRA_ARTICULAR

## 2016-08-22 DIAGNOSIS — M25562 Pain in left knee: Secondary | ICD-10-CM | POA: Insufficient documentation

## 2016-08-22 NOTE — Assessment & Plan Note (Signed)
spontaneous and by aspiration a hemarthrosis.  I suspect this is due to osteoarthritis though she reports easy bruisability.  Recent CBC normal including platelets and INR was 1.1.  We will send synovial fluid for analysis.  She would like hematology consult - placed this as well.  Icing, compression, elevation, aleve or ibuprofen.  F/u prn.  After informed written consent patient was lying supine on exam table.  Left knee was prepped with alcohol swab.  Utilizing superolateral approach, 3 mL of marcaine was used for local anesthesia.  Then using an 18g needle on 60cc syringe with ultrasound guidance, 16 mL of sanguinous fluid was aspirated from left knee.  Knee was then injected with 3:1 marcaine:depomedrol.  Patient tolerated procedure well without immediate complications

## 2016-08-22 NOTE — Progress Notes (Signed)
PCP and consultation requested by: [name], MD  Subjective:   HPI: Patient is a 55 y.o. female here for left knee swelling.  Patient reports on 11/11 she woke up with left knee and leg swollen. No acute injury or trauma Did get a massage 2 days prior to this but nothing unusual about this. Has been icing, using compression. She had a venogram and doppler studies done - found to have moderate effusion left knee and baker's cyst. Remotely had 3 arthroscopic surgeries on this and intraarticular injections. No skin changes, numbness. Had bruising in this leg and notes she bruises very easily. No known family history of clotting disorders.   Past Medical History:  Diagnosis Date  . Abdominal pain 09/13/2011  . Adenomatous colon polyp   . Blood transfusion   . ECG abnormal 11/19/2011  . Endometriosis 09/13/2011  . Foot pain, right 09/13/2011  . Hemorrhoids   . IBS (irritable bowel syndrome)   . IBS (irritable bowel syndrome)   . Migraines   . Nausea without vomiting 07/28/2014  . Neuropathy, sacral plexus 09/13/2011  . Osgood-Schlatter's disease 09/13/2011  . Preventative health care 02/15/2016  . Rosacea 02/15/2016  . 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 Ulcer ([place]) 1984   petic and duodenal    Current Outpatient Prescriptions on File Prior to Visit  Medication Sig Dispense Refill  . albuterol (PROVENTIL HFA;VENTOLIN HFA) 108 (90 BASE) MCG/ACT inhaler Inhale 2 puffs into the lungs every 6 (six) hours as needed for wheezing or shortness of breath. 1 Inhaler 4  . famciclovir (FAMVIR) 500 MG tablet Take 3 tablets at onset of symptoms.  Do not take more that 3 tablets per outbreak. 15 tablet 0  . fluticasone (FLONASE) 50 MCG/ACT nasal spray Place 2 sprays into both nostrils daily as needed for allergies or rhinitis. 16 g 6  . Green Tea, Camillia sinensis, (GREEN TEA EXTRACT PO) Take 1 capsule by mouth daily.    Marland Kitchen LATISSE 0.03 % ophthalmic solution   11  . omega-3 acid ethyl esters  (LOVAZA) 1 G capsule Take 1 g by mouth daily.    Marland Kitchen OVER THE COUNTER MEDICATION Hyloronic Acid- 2 capsules daily    . VIVELLE-DOT 0.1 MG/24HR Place 1 patch onto the skin every 3 (three) days.      No current facility-administered medications on file prior to visit.     Past Surgical History:  Procedure Laterality Date  . ABDOMINAL HYSTERECTOMY    . ANKLE RECONSTRUCTION     left @ age 65  . BREAST ENHANCEMENT SURGERY    . BUNIONECTOMY     bilateral  . EYE SURGERY  01/23/2012   cosmetic lift, eyelids, b/l  . FOOT SURGERY     left twice  . KNEE ARTHROSCOPY     bilateral  3 on left 2 on right  . LAPAROSCOPIC HYSTERECTOMY  2000   total for extensive endometriosis  . LAPAROSCOPY     multiple times for endometriosis on all organs  . METATARSAL OSTEOTOMY  11/24/2011   Procedure: METATARSAL OSTEOTOMY;  Surgeon: Wallene Huh, DPM;  Location: [facility];  Service: Podiatry;  Laterality: Right;  RIGHT AIKENS OSTEOTOMY; RIGHT METATARSAL OSTETOMY Second & Third WITH SCREW; Right Second arthroplasty lesser metatarsalphalangeal with total implant, FUSION Third TOE RIGHT  . SPLENECTOMY  07/01/11  . TONSILLECTOMY AND ADENOIDECTOMY    . WRIST RECONSTRUCTION     left twice    Allergies  Allergen Reactions  . Demerol [Meperidine] Nausea And  Vomiting  . Nucynta [Tapentadol Hydrochloride] Itching  . Adhesive [Tape] Rash    Social History   Social History  . Marital status: Single    Spouse name: N/A  . Number of children: N/A  . Years of education: N/A   Occupational History  . Not on file.   Social History Main Topics  . Smoking status: Former Smoker    Packs/day: 0.00    Quit date: 11/16/2008  . Smokeless tobacco: Never Used  . Alcohol use 3.6 oz/week    6 Cans of beer per week  . Drug use:     Types: Marijuana     Comment: for pain  . Sexual activity: No   Other Topics Concern  . 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     Family History  Problem Relation Age of Onset  . Alzheimer's disease Maternal Grandmother   . Heart attack Maternal Grandfather   . Leukemia Paternal Grandfather   . Liver disease Sister   . Cirrhosis Sister     primary biliary cirrhosis  . Kidney disease Sister   . Cancer Sister     breast  . Heart disease Father     rheumatic heart disease    BP 115/77   Pulse 65   Ht 5\' 6"  (1.676 m)   Wt 129 lb (58.5 kg)   BMI 20.82 kg/m   Review of Systems: See HPI above.     Objective:  Physical Exam:  Gen: NAD, comfortable in exam room  Left knee: Mild effusion.  No other gross deformity, ecchymoses.  1+ pitting edema. TTP suprapatellar pouch, popliteal fossa.  No joint line or other tenderness. Full extension - lacks a few degrees flexion. Negative ant/post drawers. Negative valgus/varus testing. Negative lachmanns. Negative mcmurrays, apleys, patellar apprehension. NV intact distally.   Right knee: FROM without pain  Assessment & Plan:  1. Left knee effusion - spontaneous and by aspiration a hemarthrosis.  I suspect this is due to osteoarthritis though she reports easy bruisability.  Recent CBC normal including platelets and INR was 1.1.  We will send synovial fluid for analysis.  She would like hematology consult - placed this as well.  Icing, compression, elevation, aleve or ibuprofen.  F/u prn.  After informed written consent patient was lying supine on exam table.  Left knee was prepped with alcohol swab.  Utilizing superolateral approach, 3 mL of marcaine was used for local anesthesia.  Then using an 18g needle on 60cc syringe with ultrasound guidance, 16 mL of sanguinous fluid was aspirated from left knee.  Knee was then injected with 3:1 marcaine:depomedrol.  Patient tolerated procedure well without immediate complications

## 2016-08-23 NOTE — Assessment & Plan Note (Signed)
Patient with significant pain and swelling throughout left leg. Was first noted after a deep tissue massage to left hip. No trauma. Ultrasound of LLE in ER was negative, further imaging is negative except for Baker's cyst. Is referred to sports med for further consideration. May use ice, heat, topical treatments and pain meds prn.

## 2016-08-23 NOTE — Progress Notes (Signed)
Patient ID: Annette Snow, female   DOB: October 14, 1960, 55 y.o.   MRN: [id number]   Subjective:    Patient ID: Annette Snow, female    DOB: 01-05-1961, 55 y.o.   MRN: [id number]  Chief Complaint  Patient presents with  . Hospitalization Follow-up    leg swelling    HPI Patient is in today for ER follow up. She presented to the ER 2 days ago with severe pain in left leg most notably behind left calf. Ultrasound was negative for blood clot. Unfortunately her pain, swelling and ecchymosis have persisted and are significant. She denies any falls or injury although the symptoms started after a deep tissue massage. Of note she had a DVT on right many years ago. She denies any sob, cp or palpitations.  Past Medical History:  Diagnosis Date  . Abdominal pain 09/13/2011  . Adenomatous colon polyp   . Blood transfusion   . ECG abnormal 11/19/2011  . Endometriosis 09/13/2011  . Foot pain, right 09/13/2011  . Hemorrhoids   . IBS (irritable bowel syndrome)   . IBS (irritable bowel syndrome)   . Migraines   . Nausea without vomiting 07/28/2014  . Neuropathy, sacral plexus 09/13/2011  . Osgood-Schlatter's disease 09/13/2011  . Preventative health care 02/15/2016  . Rosacea 02/15/2016  . 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 Ulcer ([place]) 1984   petic and duodenal    Past Surgical History:  Procedure Laterality Date  . ABDOMINAL HYSTERECTOMY    . ANKLE RECONSTRUCTION     left @ age 59  . BREAST ENHANCEMENT SURGERY    . BUNIONECTOMY     bilateral  . EYE SURGERY  01/23/2012   cosmetic lift, eyelids, b/l  . FOOT SURGERY     left twice  . KNEE ARTHROSCOPY     bilateral  3 on left 2 on right  . LAPAROSCOPIC HYSTERECTOMY  2000   total for extensive endometriosis  . LAPAROSCOPY     multiple times for endometriosis on all organs  . METATARSAL OSTEOTOMY  11/24/2011   Procedure: METATARSAL OSTEOTOMY;  Surgeon: Wallene Huh, DPM;  Location: [facility];  Service: Podiatry;  Laterality:  Right;  RIGHT AIKENS OSTEOTOMY; RIGHT METATARSAL OSTETOMY Second & Third WITH SCREW; Right Second arthroplasty lesser metatarsalphalangeal with total implant, FUSION Third TOE RIGHT  . SPLENECTOMY  07/01/11  . TONSILLECTOMY AND ADENOIDECTOMY    . WRIST RECONSTRUCTION     left twice    Family History  Problem Relation Age of Onset  . Alzheimer's disease Maternal Grandmother   . Heart attack Maternal Grandfather   . Leukemia Paternal Grandfather   . Liver disease Sister   . Cirrhosis Sister     primary biliary cirrhosis  . Kidney disease Sister   . Cancer Sister     breast  . Heart disease Father     rheumatic heart disease    Social History   Social History  . Marital status: Single    Spouse name: N/A  . Number of children: N/A  . Years of education: N/A   Occupational History  . Not on file.   Social History Main Topics  . Smoking status: Former Smoker    Packs/day: 0.00    Quit date: 11/16/2008  . Smokeless tobacco: Never Used  . Alcohol use 3.6 oz/week    6 Cans of beer per week  . Drug use:     Types: Marijuana     Comment: for pain  .  Sexual activity: No   Other Topics Concern  . 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     Outpatient Medications Prior to Visit  Medication Sig Dispense Refill  . albuterol (PROVENTIL HFA;VENTOLIN HFA) 108 (90 BASE) MCG/ACT inhaler Inhale 2 puffs into the lungs every 6 (six) hours as needed for wheezing or shortness of breath. 1 Inhaler 4  . famciclovir (FAMVIR) 500 MG tablet Take 3 tablets at onset of symptoms.  Do not take more that 3 tablets per outbreak. 15 tablet 0  . fluticasone (FLONASE) 50 MCG/ACT nasal spray Place 2 sprays into both nostrils daily as needed for allergies or rhinitis. 16 g 6  . Green Tea, Camillia sinensis, (GREEN TEA EXTRACT PO) Take 1 capsule by mouth daily.    Marland Kitchen LATISSE 0.03 % ophthalmic solution   11  . omega-3 acid ethyl esters  (LOVAZA) 1 G capsule Take 1 g by mouth daily.    Marland Kitchen OVER THE COUNTER MEDICATION Hyloronic Acid- 2 capsules daily    . VIVELLE-DOT 0.1 MG/24HR Place 1 patch onto the skin every 3 (three) days.      No facility-administered medications prior to visit.     Allergies  Allergen Reactions  . Demerol [Meperidine] Nausea And Vomiting  . Nucynta [Tapentadol Hydrochloride] Itching  . Adhesive [Tape] Rash    Review of Systems  Constitutional: Negative for fever and malaise/fatigue.  HENT: Negative for congestion.   Eyes: Negative for blurred vision.  Respiratory: Negative for shortness of breath.   Cardiovascular: Positive for chest pain and leg swelling. Negative for palpitations.  Gastrointestinal: Negative for abdominal pain, blood in stool and nausea.  Genitourinary: Negative for dysuria and frequency.  Musculoskeletal: Positive for joint pain. Negative for falls.  Skin: Negative for rash.  Neurological: Negative for dizziness, loss of consciousness and headaches.  Endo/Heme/Allergies: Negative for environmental allergies.  Psychiatric/Behavioral: Negative for depression. The patient is not nervous/anxious.        Objective:    Physical Exam  Constitutional: She is oriented to person, place, and time. She appears well-developed and well-nourished. No distress.  HENT:  Head: Normocephalic and atraumatic.  Nose: Nose normal.  Eyes: Right eye exhibits no discharge. Left eye exhibits no discharge.  Neck: Normal range of motion. Neck supple.  Cardiovascular: Normal rate and regular rhythm.   No murmur heard. Pulmonary/Chest: Effort normal and breath sounds normal.  Abdominal: Soft. Bowel sounds are normal. There is no tenderness.  Musculoskeletal: She exhibits edema and tenderness.  Throughout left leg.   Neurological: She is alert and oriented to person, place, and time.  Skin: Skin is warm and dry.  Bruising throughout left leg.   Psychiatric: She has a normal mood and affect.    Nursing note and vitals reviewed.   BP 102/62 (BP Location: Left Arm, Patient Position: Sitting, Cuff Size: Normal)   Pulse 77   Temp 98.1 F (36.7 C) (Oral)   Ht 5\' 6"  (1.676 m)   Wt 132 lb 8 oz (60.1 kg)   SpO2 98%   BMI 21.39 kg/m  Wt Readings from Last 3 Encounters:  08/17/16 129 lb (58.5 kg)  08/12/16 132 lb 8 oz (60.1 kg)  08/08/16 128 lb (58.1 kg)     Lab Results  Component Value Date   WBC 7.0 08/12/2016   HGB 13.3 08/12/2016   HCT 39.6 08/12/2016   PLT 261.0 08/12/2016   GLUCOSE  82 08/12/2016   CHOL 208 (H) 02/09/2016   TRIG 49.0 02/09/2016   HDL 85.20 02/09/2016   LDLCALC 113 (H) 02/09/2016   ALT 12 08/12/2016   AST 16 08/12/2016   NA 140 08/12/2016   K 4.6 08/12/2016   CL 103 08/12/2016   CREATININE 0.79 08/12/2016   BUN 14 08/12/2016   CO2 32 08/12/2016   TSH 0.61 02/09/2016   INR 1.1 (H) 08/12/2016    Lab Results  Component Value Date   TSH 0.61 02/09/2016   Lab Results  Component Value Date   WBC 7.0 08/12/2016   HGB 13.3 08/12/2016   HCT 39.6 08/12/2016   MCV 94.9 08/12/2016   PLT 261.0 08/12/2016   Lab Results  Component Value Date   NA 140 08/12/2016   K 4.6 08/12/2016   CO2 32 08/12/2016   GLUCOSE 82 08/12/2016   BUN 14 08/12/2016   CREATININE 0.79 08/12/2016   BILITOT 0.8 08/12/2016   ALKPHOS 43 08/12/2016   AST 16 08/12/2016   ALT 12 08/12/2016   PROT 6.5 08/12/2016   ALBUMIN 4.0 08/12/2016   CALCIUM 9.3 08/12/2016   GFR 79.57 08/12/2016   Lab Results  Component Value Date   CHOL 208 (H) 02/09/2016   Lab Results  Component Value Date   HDL 85.20 02/09/2016   Lab Results  Component Value Date   LDLCALC 113 (H) 02/09/2016   Lab Results  Component Value Date   TRIG 49.0 02/09/2016   Lab Results  Component Value Date   CHOLHDL 2 02/09/2016   No results found for: HGBA1C     Assessment & Plan:   Problem List Items Addressed This Visit    Abnormal bruising    Patient with significant pain and swelling  throughout left leg. Was first noted after a deep tissue massage to left hip. No trauma. Ultrasound of LLE in ER was negative, further imaging is negative except for Baker's cyst. Is referred to sports med for further consideration. May use ice, heat, topical treatments and pain meds prn.       Other Visit Diagnoses    Arthralgia of lower leg, unspecified laterality    -  Primary   Relevant Orders   Comprehensive metabolic panel (Completed)   INR/PT (Completed)   CBC w/Diff (Completed)   CT VENOGRAM ABDOMEN PELVIS (Completed)   US Venous Img Lower Unilateral Left (Completed)   Pedal edema       Relevant Orders   Comprehensive metabolic panel (Completed)   INR/PT (Completed)   CBC w/Diff (Completed)   CT VENOGRAM ABDOMEN PELVIS (Completed)   US Venous Img Lower Unilateral Left (Completed)   Pain of left calf       Relevant Orders   Comprehensive metabolic panel (Completed)   INR/PT (Completed)   CBC w/Diff (Completed)   CT VENOGRAM ABDOMEN PELVIS (Completed)   US Venous Img Lower Unilateral Left (Completed)   History of DVT (deep vein thrombosis)       Relevant Orders   Comprehensive metabolic panel (Completed)   INR/PT (Completed)   CBC w/Diff (Completed)   CT VENOGRAM ABDOMEN PELVIS (Completed)   US Venous Img Lower Unilateral Left (Completed)      I am having Annette Snow maintain her VIVELLE-DOT, omega-3 acid ethyl esters, (Green Tea, Camillia sinensis, (GREEN TEA EXTRACT PO)), OVER THE COUNTER MEDICATION, LATISSE, albuterol, famciclovir, and fluticasone.  No orders of the defined types were placed in this encounter.    [name], MD

## 2016-09-10 ENCOUNTER — Ambulatory Visit: Payer: PRIVATE HEALTH INSURANCE

## 2016-09-10 ENCOUNTER — Ambulatory Visit (HOSPITAL_BASED_OUTPATIENT_CLINIC_OR_DEPARTMENT_OTHER): Payer: PRIVATE HEALTH INSURANCE | Admitting: Hematology & Oncology

## 2016-09-10 ENCOUNTER — Other Ambulatory Visit (HOSPITAL_BASED_OUTPATIENT_CLINIC_OR_DEPARTMENT_OTHER): Payer: PRIVATE HEALTH INSURANCE

## 2016-09-10 VITALS — BP 122/86 | HR 62 | Temp 98.2°F | Wt 128.5 lb

## 2016-09-10 DIAGNOSIS — R58 Hemorrhage, not elsewhere classified: Secondary | ICD-10-CM

## 2016-09-10 DIAGNOSIS — M9250 Juvenile osteochondrosis of tibia and fibula, unspecified leg: Principal | ICD-10-CM

## 2016-09-10 DIAGNOSIS — M92529 Juvenile osteochondrosis of tibia tubercle, unspecified leg: Secondary | ICD-10-CM

## 2016-09-10 LAB — CBC WITH DIFFERENTIAL (CANCER CENTER ONLY)
BASO#: 0.1 10*3/uL (ref 0.0–0.2)
BASO%: 1.1 % (ref 0.0–2.0)
EOS ABS: 0.3 10*3/uL (ref 0.0–0.5)
EOS%: 4.2 % (ref 0.0–7.0)
HCT: 40.2 % (ref 34.8–46.6)
HEMOGLOBIN: 13.8 g/dL (ref 11.6–15.9)
LYMPH#: 3.2 10*3/uL (ref 0.9–3.3)
LYMPH%: 43 % (ref 14.0–48.0)
MCH: 32.2 pg (ref 26.0–34.0)
MCHC: 34.3 g/dL (ref 32.0–36.0)
MCV: 94 fL (ref 81–101)
MONO#: 0.6 10*3/uL (ref 0.1–0.9)
MONO%: 8.5 % (ref 0.0–13.0)
NEUT%: 43.2 % (ref 39.6–80.0)
NEUTROS ABS: 3.2 10*3/uL (ref 1.5–6.5)
Platelets: 227 10*3/uL (ref 145–400)
RBC: 4.29 10*6/uL (ref 3.70–5.32)
RDW: 12.8 % (ref 11.1–15.7)
WBC: 7.4 10*3/uL (ref 3.9–10.0)

## 2016-09-10 LAB — CHCC SATELLITE - SMEAR

## 2016-09-10 NOTE — Progress Notes (Signed)
Referral MD  Reason for Referral: Spontaneous bleed into the left knee  No chief complaint on file. : I do not know why I am here  HPI: [name] is a very charming 55 year old white female. She is originally from [place]. She's been in the [place] area for at least 10 years.  She apparently had a near death experience about 5 years ago. She had a colonoscopy. She says that the doctor hit her spleen. What I can tell, she had some polyps removed. This somehow led to internal bleeding. She underwent export for surgery. She had a splenic rupture. Her spleen was removed. She lost 1500 mL of blood.  Recently, she had swelling of the left leg. She went to the emergency room. She was felt to have a ruptured cyst. She did have a Doppler of her leg which was negative.  She's going to see sports medicine. She had an aspiration. Apparently blood was aspirated.  She then reports that she has easy bruising.  There is nobody in the family as ever had any bleeding issues. She has had prior surgeries without any issues. She had endometriosis back about 18 years ago. She had a total hysterectomy.  Because of this bleeding to the knee, and she was told she had a CVA hematologist.  Again she's not sure as to why she came to see me. She is very nice. She is very active. She is a Haematologist.  She does state some easy bruising. It sounds like this has been going on for a couple years. I will note this might be more prominent after her menopause.  She does not smoke. She has occasional beer.  There's been no weight loss or weight gain.  Again, there is nobody in the family with any bleeding history. She really is not sure about her family history. It sounds like she is not all that close to her family.  I would have considered her performance status is ECOG 0.    Past Medical History:  Diagnosis Date  . Abdominal pain 09/13/2011  . Adenomatous colon polyp   . Blood transfusion   . ECG abnormal  11/19/2011  . Endometriosis 09/13/2011  . Foot pain, right 09/13/2011  . Hemorrhoids   . IBS (irritable bowel syndrome)   . IBS (irritable bowel syndrome)   . Migraines   . Nausea without vomiting 07/28/2014  . Neuropathy, sacral plexus 09/13/2011  . Osgood-Schlatter's disease 09/13/2011  . Preventative health care 02/15/2016  . Rosacea 02/15/2016  . 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 Ulcer ([place]) 1984   petic and duodenal  :  Past Surgical History:  Procedure Laterality Date  . ABDOMINAL HYSTERECTOMY    . ANKLE RECONSTRUCTION     left @ age 20  . BREAST ENHANCEMENT SURGERY    . BUNIONECTOMY     bilateral  . EYE SURGERY  01/23/2012   cosmetic lift, eyelids, b/l  . FOOT SURGERY     left twice  . KNEE ARTHROSCOPY     bilateral  3 on left 2 on right  . LAPAROSCOPIC HYSTERECTOMY  2000   total for extensive endometriosis  . LAPAROSCOPY     multiple times for endometriosis on all organs  . METATARSAL OSTEOTOMY  11/24/2011   Procedure: METATARSAL OSTEOTOMY;  Surgeon: Wallene Huh, DPM;  Location: [facility];  Service: Podiatry;  Laterality: Right;  RIGHT AIKENS OSTEOTOMY; RIGHT METATARSAL OSTETOMY Second & Third WITH SCREW; Right Second arthroplasty lesser metatarsalphalangeal with total implant,  FUSION Third TOE RIGHT  . SPLENECTOMY  07/01/11  . TONSILLECTOMY AND ADENOIDECTOMY    . WRIST RECONSTRUCTION     left twice  :   Current Outpatient Prescriptions:  .  albuterol (PROVENTIL HFA;VENTOLIN HFA) 108 (90 BASE) MCG/ACT inhaler, Inhale 2 puffs into the lungs every 6 (six) hours as needed for wheezing or shortness of breath., Disp: 1 Inhaler, Rfl: 4 .  famciclovir (FAMVIR) 500 MG tablet, Take 3 tablets at onset of symptoms.  Do not take more that 3 tablets per outbreak., Disp: 15 tablet, Rfl: 0 .  fluticasone (FLONASE) 50 MCG/ACT nasal spray, Place 2 sprays into both nostrils daily as needed for allergies or rhinitis., Disp: 16 g, Rfl: 6 .  Green Tea, Camillia sinensis,  (GREEN TEA EXTRACT PO), Take 1 capsule by mouth daily., Disp: , Rfl:  .  omega-3 acid ethyl esters (LOVAZA) 1 G capsule, Take 1 g by mouth daily., Disp: , Rfl:  .  OVER THE COUNTER MEDICATION, Hyloronic Acid- 2 capsules daily, Disp: , Rfl:  .  VIVELLE-DOT 0.1 MG/24HR, Place 1 patch onto the skin every 3 (three) days. , Disp: , Rfl:  .  DOCOSAHEXAENOIC ACID PO, Take 1 g by mouth., Disp: , Rfl:  .  LATISSE 0.03 % ophthalmic solution, , Disp: , Rfl: 11:  :  Allergies  Allergen Reactions  . Demerol [Meperidine] Nausea And Vomiting  . Nucynta [Tapentadol Hydrochloride] Itching  . Adhesive [Tape] Rash  :  Family History  Problem Relation Age of Onset  . Alzheimer's disease Maternal Grandmother   . Heart attack Maternal Grandfather   . Leukemia Paternal Grandfather   . Liver disease Sister   . Cirrhosis Sister     primary biliary cirrhosis  . Kidney disease Sister   . Cancer Sister     breast  . Heart disease Father     rheumatic heart disease  :  Social History   Social History  . Marital status: Single    Spouse name: N/A  . Number of children: N/A  . Years of education: N/A   Occupational History  . Not on file.   Social History Main Topics  . Smoking status: Former Smoker    Packs/day: 0.00    Quit date: 11/16/2008  . Smokeless tobacco: Never Used  . Alcohol use 3.6 oz/week    6 Cans of beer per week  . Drug use:     Types: Marijuana     Comment: for pain  . Sexual activity: No   Other Topics Concern  . 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   :  Pertinent items are noted in HPI.  Exam: @IPVITALS @  Well-developed and well-nourished white female in no obvious distress. Vital signs are temperature of 98 2. Pulse 62. Blood pressure 122/86. Weight is 129 pounds. Head neck exam shows no ocular or oral lesions. She has no operable cervical or supraclavicular lymph nodes. Lungs are clear  bilaterally. Cardiac exam regular rate and rhythm with no murmurs, rubs or bruits. Abdomen is soft. She has a well-healed laparotomy scar. She has no fluid wave. There is no palpable liver edge. Back exam shows no tenderness over the spine, ribs or hips. Extremities shows no clubbing, cyanosis or edema. She is good range of motion of her joints. There is no joint swelling. She has good range of motion of her joints. Skin exam shows no  rashes, ecchymoses or petechia. She may have some scattered ecchymoses. Neurological exam shows no focal neurological deficits.    Recent Labs  09/10/16 1424  WBC 7.4  HGB 13.8  HCT 40.2  PLT 227   No results for input(s): NA, K, CL, CO2, GLUCOSE, BUN, CREATININE, CALCIUM in the last 72 hours.  Blood smear review: Normochromic and normocytic population of red blood cells. There are no teardrop cells. There are no nucleated red blood cells. I see no rouleau formation. There are no target cells. White cells are normal in morphology maturation. There are no hypersegmented polys. I see no immature myeloid or lymphoid forms. Platelets are adequate in number and size. She has a few large platelets. Platelets are well granulated.  Pathology: None     Assessment and Plan:  [name] is a very nice 55 year old white female. She has an episode of bleeding with left knee hemarthrosis.  I'm not sure what might cause this. At 55 years old, I would not think that she would have a bleeding disorder. Being that she is female, hemophilia would be highly unlikely. There is no history of bleeding in the family.  Sometimes, acquired von Willebrand's can occur. We are checking her for this.  She's had past surgery without any problems. The bleeding that she had about 4 years or so ago was from a splenic injury from a colonoscopy.  Her blood smear is unremarkable.  I cannot find any thing on her physical exam that looks suspicious.  We will have to see what her lab work  looks like. I really don't think we have to get her back. Again, I cannot find anything obvious that would point to a bleeding disorder. I would think that she had one, then this would manifested a lot sooner.  I spent about 45 mins with her. She is very nice.

## 2016-09-11 LAB — VON WILLEBRAND PANEL
Factor VIII Activity: 118 % (ref 57–163)
vWF Activity: 123 % (ref 50–200)
von Willebrand Factor (vWF) Ag: 95 % (ref 50–200)

## 2016-09-11 LAB — APTT: aPTT: 27 s (ref 24–33)

## 2016-09-13 ENCOUNTER — Telehealth: Payer: Self-pay | Admitting: *Deleted

## 2016-09-13 NOTE — Telephone Encounter (Signed)
-----   Message from Volanda Napoleon, MD sent at 09/13/2016  2:34 PM EST ----- Call - I cannot find anything in the bllodwork that indicates a bleeding disorder.  Everything looks good!!!  Keep exercising!!  Merry Christmas!!  pete

## 2016-10-14 ENCOUNTER — Other Ambulatory Visit: Payer: Self-pay | Admitting: Family Medicine

## 2016-10-14 DIAGNOSIS — T7840XS Allergy, unspecified, sequela: Secondary | ICD-10-CM

## 2016-11-11 ENCOUNTER — Other Ambulatory Visit: Payer: Self-pay | Admitting: Family Medicine

## 2016-11-11 ENCOUNTER — Telehealth: Payer: Self-pay

## 2016-11-11 DIAGNOSIS — T7840XS Allergy, unspecified, sequela: Secondary | ICD-10-CM

## 2016-11-11 NOTE — Telephone Encounter (Signed)
Patient called and stated she has a fever of 100 degrees up to 102 degrees. She also stated she has a headache, body aches, vomiting and no spleen. She also stated that she went to see her plastic surgeon and he advised her that she had a flesh eating bacteria on her leg and referred her to dermatology. She stated she was on some antibiotics that did not seem to be doing the work. Patient called in just wanting to know if she could continue taking her tylenol. Per Dr. Etter Sjogren she needed to go to the ER to be further evaluated. Patient agreed.  PC

## 2017-01-25 ENCOUNTER — Other Ambulatory Visit: Payer: Self-pay | Admitting: Family Medicine

## 2017-01-25 DIAGNOSIS — R0602 Shortness of breath: Secondary | ICD-10-CM

## 2017-02-10 ENCOUNTER — Ambulatory Visit (INDEPENDENT_AMBULATORY_CARE_PROVIDER_SITE_OTHER): Payer: PRIVATE HEALTH INSURANCE | Admitting: Family Medicine

## 2017-02-10 ENCOUNTER — Encounter: Payer: Self-pay | Admitting: Family Medicine

## 2017-02-10 VITALS — BP 104/71 | HR 60 | Temp 98.3°F | Ht 66.0 in | Wt 130.4 lb

## 2017-02-10 DIAGNOSIS — Z23 Encounter for immunization: Secondary | ICD-10-CM | POA: Diagnosis not present

## 2017-02-10 DIAGNOSIS — C44729 Squamous cell carcinoma of skin of left lower limb, including hip: Secondary | ICD-10-CM

## 2017-02-10 DIAGNOSIS — Z0000 Encounter for general adult medical examination without abnormal findings: Secondary | ICD-10-CM | POA: Diagnosis not present

## 2017-02-10 DIAGNOSIS — Q8901 Asplenia (congenital): Secondary | ICD-10-CM | POA: Diagnosis not present

## 2017-02-10 DIAGNOSIS — K589 Irritable bowel syndrome without diarrhea: Secondary | ICD-10-CM | POA: Diagnosis not present

## 2017-02-10 LAB — COMPREHENSIVE METABOLIC PANEL
ALT: 13 U/L (ref 0–35)
AST: 16 U/L (ref 0–37)
Albumin: 4.3 g/dL (ref 3.5–5.2)
Alkaline Phosphatase: 40 U/L (ref 39–117)
BUN: 13 mg/dL (ref 6–23)
CHLORIDE: 103 meq/L (ref 96–112)
CO2: 29 meq/L (ref 19–32)
CREATININE: 0.84 mg/dL (ref 0.40–1.20)
Calcium: 9.8 mg/dL (ref 8.4–10.5)
GFR: 74 mL/min (ref >60.00)
Glucose, Bld: 85 mg/dL (ref 70–99)
Potassium: 4.9 mEq/L (ref 3.5–5.1)
SODIUM: 137 meq/L (ref 135–145)
Total Bilirubin: 0.8 mg/dL (ref 0.2–1.2)
Total Protein: 6.8 g/dL (ref 6.0–8.3)

## 2017-02-10 LAB — LIPID PANEL
CHOL/HDL RATIO: 2
Cholesterol: 209 mg/dL — ABNORMAL HIGH (ref 0–200)
HDL: 101.4 mg/dL (ref >39.00)
LDL CALC: 95 mg/dL (ref 0–99)
NonHDL: 107.56
TRIGLYCERIDES: 61 mg/dL (ref 0.0–149.0)
VLDL: 12.2 mg/dL (ref 0.0–40.0)

## 2017-02-10 LAB — CBC
HCT: 42.8 % (ref 36.0–46.0)
Hemoglobin: 14.3 g/dL (ref 12.0–15.0)
MCHC: 33.4 g/dL (ref 30.0–36.0)
MCV: 96.1 fl (ref 78.0–100.0)
Platelets: 224 10*3/uL (ref 150.0–400.0)
RBC: 4.45 Mil/uL (ref 3.87–5.11)
RDW: 13.9 % (ref 11.5–15.5)
WBC: 7 10*3/uL (ref 4.0–10.5)

## 2017-02-10 LAB — TSH: TSH: 1.08 u[IU]/mL (ref 0.35–4.50)

## 2017-02-10 NOTE — Progress Notes (Signed)
Pre visit review using our clinic review tool, if applicable. No additional management support is needed unless otherwise documented below in the visit note. 

## 2017-02-10 NOTE — Assessment & Plan Note (Signed)
Doing well on current diet. No significant abdominal pain or concerns

## 2017-02-10 NOTE — Patient Instructions (Addendum)
Apply Salonpas, Apspercreme, or Icy Hot (with Lidocaine) to the affected area as needed. Spoke with you today in regards to Annette Snow. Encouraged to you bring a notarized copy of your Living Will and Healthcare Power of Attorney into Dr. Frederik Pear Office to be scanned into your Chart. Preventive Care 40-64 Years, Female Preventive care refers to lifestyle choices and visits with your health care provider that can promote health and wellness. What does preventive care include?  A yearly physical exam. This is also called an annual well check.  Dental exams once or twice a year.  Routine eye exams. Ask your health care provider how often you should have your eyes checked.  Personal lifestyle choices, including:  Daily care of your teeth and gums.  Regular physical activity.  Eating a healthy diet.  Avoiding tobacco and drug use.  Limiting alcohol use.  Practicing safe sex.  Taking low-dose aspirin daily starting at age 2.  Taking vitamin and mineral supplements as recommended by your health care provider. What happens during an annual well check? The services and screenings done by your health care provider during your annual well check will depend on your age, overall health, lifestyle risk factors, and family history of disease. Counseling  Your health care provider may ask you questions about your:  Alcohol use.  Tobacco use.  Drug use.  Emotional well-being.  Home and relationship well-being.  Sexual activity.  Eating habits.  Work and work Statistician.  Method of birth control.  Menstrual cycle.  Pregnancy history. Screening  You may have the following tests or measurements:  Height, weight, and BMI.  Blood pressure.  Lipid and cholesterol levels. These may be checked every 5 years, or more frequently if you are over 14 years old.  Skin check.  Lung cancer screening. You may have this screening every year starting at age 47 if you have a  30-pack-year history of smoking and currently smoke or have quit within the past 15 years.  Fecal occult blood test (FOBT) of the stool. You may have this test every year starting at age 4.  Flexible sigmoidoscopy or colonoscopy. You may have a sigmoidoscopy every 5 years or a colonoscopy every 10 years starting at age 28.  Hepatitis C blood test.  Hepatitis B blood test.  Sexually transmitted disease (STD) testing.  Diabetes screening. This is done by checking your blood sugar (glucose) after you have not eaten for a while (fasting). You may have this done every 1-3 years.  Mammogram. This may be done every 1-2 years. Talk to your health care provider about when you should start having regular mammograms. This may depend on whether you have a family history of breast cancer.  BRCA-related cancer screening. This may be done if you have a family history of breast, ovarian, tubal, or peritoneal cancers.  Pelvic exam and Pap test. This may be done every 3 years starting at age 53. Starting at age 43, this may be done every 5 years if you have a Pap test in combination with an HPV test.  Bone density scan. This is done to screen for osteoporosis. You may have this scan if you are at high risk for osteoporosis. Discuss your test results, treatment options, and if necessary, the need for more tests with your health care provider. Vaccines  Your health care provider may recommend certain vaccines, such as:  Influenza vaccine. This is recommended every year.  Tetanus, diphtheria, and acellular pertussis (Tdap, Td) vaccine. You may need  a Td booster every 10 years.  Varicella vaccine. You may need this if you have not been vaccinated.  Zoster vaccine. You may need this after age 28.  Measles, mumps, and rubella (MMR) vaccine. You may need at least one dose of MMR if you were born in 1957 or later. You may also need a second dose.  Pneumococcal 13-valent conjugate (PCV13) vaccine. You may  need this if you have certain conditions and were not previously vaccinated.  Pneumococcal polysaccharide (PPSV23) vaccine. You may need one or two doses if you smoke cigarettes or if you have certain conditions.  Meningococcal vaccine. You may need this if you have certain conditions.  Hepatitis A vaccine. You may need this if you have certain conditions or if you travel or work in places where you may be exposed to hepatitis A.  Hepatitis B vaccine. You may need this if you have certain conditions or if you travel or work in places where you may be exposed to hepatitis B.  Haemophilus influenzae type b (Hib) vaccine. You may need this if you have certain conditions. Talk to your health care provider about which screenings and vaccines you need and how often you need them. This information is not intended to replace advice given to you by your health care provider. Make sure you discuss any questions you have with your health care provider. Document Released: 10/10/2015 Document Revised: 06/02/2016 Document Reviewed: 07/15/2015 Elsevier Interactive Patient Education  2017 [employer].

## 2017-02-10 NOTE — Assessment & Plan Note (Signed)
Patient encouraged to maintain heart healthy diet, regular exercise, adequate sleep. Consider daily probiotics. Take medications as prescribed. Consider Shingrix will check varicella titer. Given and reviewed copy of ACP documents from [employer] and encouraged to complete and return

## 2017-02-10 NOTE — Assessment & Plan Note (Signed)
Has annual appt with dermatology soon. 2 new lesions unremarkable on exam

## 2017-02-10 NOTE — Progress Notes (Signed)
Patient ID: Annette Snow, female   DOB: 09-06-1961, 56 y.o.   MRN: [id number]   Subjective:  I acted as a Education administrator for [name], MD. Ra[place]ord [place], [place]   Patient ID: Annette Snow, female    DOB: May 20, 1961, 56 y.o.   MRN: [id number]  Chief Complaint  Patient presents with  . Annual Exam    HPI  Patient is in today for an annual examination. Patient has ha Hx of IBS, sacral plexus neuropathy, rosacea, endometriosis, abnormal bruising. Patient has no acute concerns noted at this time. She has been maintaining a heart healthy diet and staying physically active. Her bowels are moving well and no significant flare in abdominal pain although she does note her abdominal hernia gets sore at her incision site at times. Never angry and red. Denies CP/palp/SOB/HA/congestion/fevers/GI or GU c/o. Taking meds as prescribed  Patient Care Team: Mosie Lukes, MD as PCP - General (Family Medicine) Lafayette Dragon, MD (Inactive) as Consulting Physician (Gastroenterology)   Past Medical History:  Diagnosis Date  . Abdominal pain 09/13/2011  . Adenomatous colon polyp   . Blood transfusion   . ECG abnormal 11/19/2011  . Endometriosis 09/13/2011  . Foot pain, right 09/13/2011  . Hemorrhoids   . IBS (irritable bowel syndrome)   . IBS (irritable bowel syndrome)   . Migraines   . Nausea without vomiting 07/28/2014  . Neuropathy, sacral plexus 09/13/2011  . Osgood-Schlatter's disease 09/13/2011  . Preventative health care 02/15/2016  . Rosacea 02/15/2016  . 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 Ulcer 1984   petic and duodenal    Past Surgical History:  Procedure Laterality Date  . ABDOMINAL HYSTERECTOMY    . ANKLE RECONSTRUCTION     left @ age 23  . BREAST ENHANCEMENT SURGERY    . BUNIONECTOMY     bilateral  . EYE SURGERY  01/23/2012   cosmetic lift, eyelids, b/l  . FOOT SURGERY     left twice  . KNEE ARTHROSCOPY     bilateral  3 on left 2 on right  . LAPAROSCOPIC HYSTERECTOMY  2000   total for  extensive endometriosis  . LAPAROSCOPY     multiple times for endometriosis on all organs  . METATARSAL OSTEOTOMY  11/24/2011   Procedure: METATARSAL OSTEOTOMY;  Surgeon: Wallene Huh, DPM;  Location: [facility];  Service: Podiatry;  Laterality: Right;  RIGHT AIKENS OSTEOTOMY; RIGHT METATARSAL OSTETOMY Second & Third WITH SCREW; Right Second arthroplasty lesser metatarsalphalangeal with total implant, FUSION Third TOE RIGHT  . SPLENECTOMY  07/01/11  . TONSILLECTOMY AND ADENOIDECTOMY    . WRIST RECONSTRUCTION     left twice    Family History  Problem Relation Age of Onset  . Alzheimer's disease Maternal Grandmother   . Heart attack Maternal Grandfather   . Leukemia Paternal Grandfather   . Liver disease Sister   . Cirrhosis Sister        primary biliary cirrhosis  . Kidney disease Sister   . Cancer Sister        breast  . Heart disease Father        rheumatic heart disease    Social History   Social History  . Marital status: Single    Spouse name: N/A  . Number of children: N/A  . Years of education: N/A   Occupational History  . Not on file.   Social History Main Topics  . Smoking status: Former Smoker    Packs/day: 0.00  Quit date: 11/16/2008  . Smokeless tobacco: Never Used  . Alcohol use 3.6 oz/week    6 Cans of beer per week  . Drug use: Yes    Types: Marijuana     Comment: for pain  . Sexual activity: No   Other Topics Concern  . 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     Outpatient Medications Prior to Visit  Medication Sig Dispense Refill  . famciclovir (FAMVIR) 500 MG tablet Take 3 tablets at onset of symptoms.  Do not take more that 3 tablets per outbreak. 15 tablet 0  . fluticasone (FLONASE) 50 MCG/ACT nasal spray SHAKE LIQUID AND USE 2 SPRAYS IN EACH NOSTRIL DAILY AS NEEDED FOR ALLERGIES OR RHINITIS 16 g 0  . PROVENTIL HFA 108 (90 Base) MCG/ACT inhaler INHALE 2  PUFFS INTO THE LUNGS EVERY 6 HOURS AS NEEDED FOR WHEEZING OR SHORTNESS OF BREATH 6.7 g 0  . VIVELLE-DOT 0.1 MG/24HR Place 1 patch onto the skin every 3 (three) days.     . DOCOSAHEXAENOIC ACID PO Take 1 g by mouth.    Nyoka Cowden Tea, Camillia sinensis, (GREEN TEA EXTRACT PO) Take 1 capsule by mouth daily.    Marland Kitchen LATISSE 0.03 % ophthalmic solution   11  . omega-3 acid ethyl esters (LOVAZA) 1 G capsule Take 1 g by mouth daily.    Marland Kitchen OVER THE COUNTER MEDICATION Hyloronic Acid- 2 capsules daily     No facility-administered medications prior to visit.     Allergies  Allergen Reactions  . Demerol [Meperidine] Nausea And Vomiting  . Nucynta [Tapentadol Hydrochloride] Itching  . Adhesive [Tape] Rash    Review of Systems  Constitutional: Negative for fever and malaise/fatigue.  HENT: Negative for congestion.   Eyes: Negative for blurred vision.  Respiratory: Negative for cough and shortness of breath.   Cardiovascular: Positive for leg swelling. Negative for chest pain and palpitations.  Gastrointestinal: Negative for vomiting.  Musculoskeletal: Negative for back pain.  Skin: Negative for rash.  Neurological: Negative for loss of consciousness and headaches.       Objective:    Physical Exam  Constitutional: She is oriented to person, place, and time. She appears well-developed and well-nourished. No distress.  HENT:  Head: Normocephalic and atraumatic.  Eyes: Conjunctivae are normal.  Neck: Normal range of motion. No thyromegaly present.  Cardiovascular: Normal rate and regular rhythm.   Pulmonary/Chest: Effort normal and breath sounds normal. She has no wheezes.  Abdominal: Soft. Bowel sounds are normal. There is no tenderness.  Musculoskeletal: She exhibits no edema or deformity.  Neurological: She is alert and oriented to person, place, and time.  Skin: Skin is warm and dry. She is not diaphoretic.  2 mm brown raised lesion left inner thigh.1 cm raised, waxy brown lesion on mid back   Psychiatric: She has a normal mood and affect.    BP 104/71 (BP Location: Left Arm, Patient Position: Sitting, Cuff Size: Normal)   Pulse 60   Temp 98.3 F (36.8 C) (Oral)   Ht 5\' 6"  (1.676 m)   Wt 130 lb 6.4 oz (59.1 kg)   SpO2 99% Comment: RA  BMI 21.05 kg/m  Wt Readings from Last 3 Encounters:  02/10/17 130 lb 6.4 oz (59.1 kg)  09/10/16 128 lb 8 oz (58.3 kg)  08/17/16 129 lb (58.5 kg)   BP Readings from Last 3 Encounters:  02/10/17 104/71  09/10/16 122/86  08/17/16 115/77     Immunization History  Administered Date(s) Administered  . HiB (PRP-OMP) 05/11/2016  . Influenza Split 06/23/2012  . Influenza Whole 07/09/2011  . Influenza,inj,Quad PF,36+ Mos 07/02/2013, 06/11/2014  . Meningococcal B, OMV 05/11/2016, 07/13/2016  . Meningococcal Conjugate 07/09/2011, 04/06/2016  . Meningococcal Polysaccharide 02/09/2016  . Pneumococcal Conjugate-13 02/09/2016  . Pneumococcal Polysaccharide-23 07/09/2011, 04/06/2016  . Tdap 07/02/2013    Health Maintenance  Topic Date Due  . INFLUENZA VACCINE  04/27/2017  . MAMMOGRAM  02/08/2018  . TETANUS/TDAP  07/03/2023  . Hepatitis C Screening  Completed  . HIV Screening  Completed  . COLONOSCOPY  Excluded    Lab Results  Component Value Date   WBC 7.4 09/10/2016   HGB 13.8 09/10/2016   HCT 40.2 09/10/2016   PLT 227 09/10/2016   GLUCOSE 82 08/12/2016   CHOL 208 (H) 02/09/2016   TRIG 49.0 02/09/2016   HDL 85.20 02/09/2016   LDLCALC 113 (H) 02/09/2016   ALT 12 08/12/2016   AST 16 08/12/2016   NA 140 08/12/2016   K 4.6 08/12/2016   CL 103 08/12/2016   CREATININE 0.79 08/12/2016   BUN 14 08/12/2016   CO2 32 08/12/2016   TSH 0.61 02/09/2016   INR 1.1 (H) 08/12/2016    Lab Results  Component Value Date   TSH 0.61 02/09/2016   Lab Results  Component Value Date   WBC 7.4 09/10/2016   HGB 13.8 09/10/2016   HCT 40.2 09/10/2016   MCV 94 09/10/2016   PLT 227 09/10/2016   Lab Results  Component Value Date   NA 140  08/12/2016   K 4.6 08/12/2016   CO2 32 08/12/2016   GLUCOSE 82 08/12/2016   BUN 14 08/12/2016   CREATININE 0.79 08/12/2016   BILITOT 0.8 08/12/2016   ALKPHOS 43 08/12/2016   AST 16 08/12/2016   ALT 12 08/12/2016   PROT 6.5 08/12/2016   ALBUMIN 4.0 08/12/2016   CALCIUM 9.3 08/12/2016   GFR 79.57 08/12/2016   Lab Results  Component Value Date   CHOL 208 (H) 02/09/2016   Lab Results  Component Value Date   HDL 85.20 02/09/2016   Lab Results  Component Value Date   LDLCALC 113 (H) 02/09/2016   Lab Results  Component Value Date   TRIG 49.0 02/09/2016   Lab Results  Component Value Date   CHOLHDL 2 02/09/2016   No results found for: HGBA1C       Assessment & Plan:   Problem List Items Addressed This Visit    Spleen absent    Immunizations up to date. She is considering Shingrix check varicella titer      IBS (irritable bowel syndrome)    Doing well on current diet. No significant abdominal pain or concerns      Preventative health care - Primary    Patient encouraged to maintain heart healthy diet, regular exercise, adequate sleep. Consider daily probiotics. Take medications as prescribed. Consider Shingrix will check varicella titer. Given and reviewed copy of ACP documents from [employer] and encouraged to complete and return      Relevant Orders   CBC   Comprehensive metabolic panel   Lipid panel   TSH   Varicella zoster antibody, IgG   Squamous cell carcinoma, leg    Has annual appt with dermatology soon. 2 new lesions unremarkable on exam       Other Visit Diagnoses    Need for viral immunization  Relevant Orders   Varicella zoster antibody, IgG      I have discontinued Ms. Schroll's omega-3 acid ethyl esters, (Green Tea, Camillia sinensis, (GREEN TEA EXTRACT PO)), OVER THE COUNTER MEDICATION, and LATISSE. I am also having her maintain her VIVELLE-DOT, famciclovir, DOCOSAHEXAENOIC ACID PO, fluticasone, and PROVENTIL HFA.  No  orders of the defined types were placed in this encounter.   CMA served as Education administrator during this visit. History, Physical and Plan performed by medical provider. Documentation and orders reviewed and attested to.  [name], MD

## 2017-02-10 NOTE — Assessment & Plan Note (Signed)
Immunizations up to date. She is considering Shingrix check varicella titer

## 2017-02-11 LAB — VARICELLA ZOSTER ANTIBODY, IGG

## 2017-02-14 ENCOUNTER — Other Ambulatory Visit: Payer: Self-pay | Admitting: Family Medicine

## 2017-02-14 DIAGNOSIS — R0602 Shortness of breath: Secondary | ICD-10-CM

## 2017-03-31 ENCOUNTER — Other Ambulatory Visit: Payer: Self-pay | Admitting: Family Medicine

## 2017-03-31 DIAGNOSIS — Z1231 Encounter for screening mammogram for malignant neoplasm of breast: Secondary | ICD-10-CM

## 2017-05-02 ENCOUNTER — Ambulatory Visit
Admission: RE | Admit: 2017-05-02 | Discharge: 2017-05-02 | Disposition: A | Discharge status: Home / Self Care | Payer: PRIVATE HEALTH INSURANCE | Source: Ambulatory Visit | Attending: Family Medicine | Admitting: Family Medicine

## 2017-05-02 DIAGNOSIS — Z1231 Encounter for screening mammogram for malignant neoplasm of breast: Secondary | ICD-10-CM

## 2017-05-27 ENCOUNTER — Other Ambulatory Visit: Payer: Self-pay | Admitting: Family Medicine

## 2017-05-27 DIAGNOSIS — T7840XS Allergy, unspecified, sequela: Secondary | ICD-10-CM

## 2017-05-28 HISTORY — PX: FOOT SURGERY: SHX648

## 2017-07-21 ENCOUNTER — Telehealth: Payer: Self-pay | Admitting: Family Medicine

## 2017-07-21 DIAGNOSIS — B009 Herpesviral infection, unspecified: Secondary | ICD-10-CM

## 2017-07-21 MED ORDER — FAMCICLOVIR 500 MG PO TABS: ORAL_TABLET | ORAL | 0 refills | Status: DC

## 2017-07-21 NOTE — Telephone Encounter (Signed)
Relation to pt: self  Call back number:[phone]   Reason for call:  Patient requesting famciclovir (FAMVIR) 500 MG tablet refill due to stress rash located on her buttucks

## 2017-07-21 NOTE — Telephone Encounter (Signed)
rx sent

## 2017-08-08 ENCOUNTER — Other Ambulatory Visit: Payer: Self-pay | Admitting: Family Medicine

## 2017-08-08 DIAGNOSIS — R0602 Shortness of breath: Secondary | ICD-10-CM

## 2017-08-29 ENCOUNTER — Other Ambulatory Visit: Payer: Self-pay | Admitting: Family Medicine

## 2017-08-29 DIAGNOSIS — R0602 Shortness of breath: Secondary | ICD-10-CM

## 2017-09-17 ENCOUNTER — Other Ambulatory Visit: Payer: Self-pay | Admitting: Family Medicine

## 2017-09-17 DIAGNOSIS — R0602 Shortness of breath: Secondary | ICD-10-CM

## 2017-10-14 ENCOUNTER — Other Ambulatory Visit: Payer: Self-pay | Admitting: Family Medicine

## 2017-10-14 DIAGNOSIS — R0602 Shortness of breath: Secondary | ICD-10-CM

## 2018-02-16 ENCOUNTER — Ambulatory Visit (INDEPENDENT_AMBULATORY_CARE_PROVIDER_SITE_OTHER): Payer: PRIVATE HEALTH INSURANCE | Admitting: Family Medicine

## 2018-02-16 ENCOUNTER — Encounter: Payer: Self-pay | Admitting: Family Medicine

## 2018-02-16 VITALS — BP 92/60 | HR 65 | Temp 98.3°F | Resp 18 | Ht 66.0 in | Wt 136.6 lb

## 2018-02-16 DIAGNOSIS — Q8901 Asplenia (congenital): Secondary | ICD-10-CM

## 2018-02-16 DIAGNOSIS — Z0001 Encounter for general adult medical examination with abnormal findings: Secondary | ICD-10-CM | POA: Diagnosis not present

## 2018-02-16 DIAGNOSIS — Z23 Encounter for immunization: Secondary | ICD-10-CM

## 2018-02-16 DIAGNOSIS — R1030 Lower abdominal pain, unspecified: Secondary | ICD-10-CM | POA: Diagnosis not present

## 2018-02-16 DIAGNOSIS — R0602 Shortness of breath: Secondary | ICD-10-CM | POA: Diagnosis not present

## 2018-02-16 DIAGNOSIS — R110 Nausea: Secondary | ICD-10-CM

## 2018-02-16 DIAGNOSIS — Z0000 Encounter for general adult medical examination without abnormal findings: Secondary | ICD-10-CM

## 2018-02-16 LAB — CBC
HCT: 41.6 % (ref 36.0–46.0)
Hemoglobin: 14 g/dL (ref 12.0–15.0)
MCHC: 33.5 g/dL (ref 30.0–36.0)
MCV: 96.9 fl (ref 78.0–100.0)
Platelets: 256 10*3/uL (ref 150.0–400.0)
RBC: 4.3 Mil/uL (ref 3.87–5.11)
RDW: 13.4 % (ref 11.5–15.5)
WBC: 7 10*3/uL (ref 4.0–10.5)

## 2018-02-16 LAB — COMPREHENSIVE METABOLIC PANEL
ALT: 17 U/L (ref 0–35)
AST: 23 U/L (ref 0–37)
Albumin: 4.2 g/dL (ref 3.5–5.2)
Alkaline Phosphatase: 48 U/L (ref 39–117)
BUN: 10 mg/dL (ref 6–23)
CHLORIDE: 103 meq/L (ref 96–112)
CO2: 30 meq/L (ref 19–32)
CREATININE: 0.85 mg/dL (ref 0.40–1.20)
Calcium: 10.1 mg/dL (ref 8.4–10.5)
GFR: 72.74 mL/min (ref >60.00)
Glucose, Bld: 91 mg/dL (ref 70–99)
Potassium: 5.1 mEq/L (ref 3.5–5.1)
SODIUM: 139 meq/L (ref 135–145)
Total Bilirubin: 1 mg/dL (ref 0.2–1.2)
Total Protein: 6.7 g/dL (ref 6.0–8.3)

## 2018-02-16 LAB — LIPID PANEL
CHOL/HDL RATIO: 2
Cholesterol: 200 mg/dL (ref 0–200)
HDL: 92.8 mg/dL (ref >39.00)
LDL CALC: 97 mg/dL (ref 0–99)
NonHDL: 107.62
Triglycerides: 53 mg/dL (ref 0.0–149.0)
VLDL: 10.6 mg/dL (ref 0.0–40.0)

## 2018-02-16 LAB — TSH: TSH: 1.33 u[IU]/mL (ref 0.35–4.50)

## 2018-02-16 MED ORDER — FLUTICASONE PROPIONATE HFA 110 MCG/ACT IN AERO: 2.0000 | INHALATION_SPRAY | Freq: Two times a day (BID) | RESPIRATORY_TRACT | 3 refills | Status: DC

## 2018-02-16 NOTE — Progress Notes (Signed)
Subjective:  I acted as a Education administrator for Dr. Charlett Blake. [place], [place]  Patient ID: Annette Snow, female    DOB: 1961-07-24, 57 y.o.   MRN: [id number]  No chief complaint on file.   HPI  Patient is in today for an annual exam and follow up on chronic abdominal pain and worsening shortness of breath. She has been able to manage her abdominal pain with a regimen that keeps her bowels moving well most days. She has some days when the pain gets severe and she has to rest and try some body work to get it to release her. Her greater concern is worsening shortness of breath. She now has to sleep on a wedge and still can wake up short of breath. Most days she is getting winded easily with exertion. She does note she has a maternal uncle who died at 42 from some unknown pulmonary disease and her mother has some pulmonary disease as well. The patient uses Albuterol as needed with decent, temporary improvement. She is staying as active as tolerated and eats a heart healthy diet. No recent hospitalization or febrile illness. Denies CP/HA/congestion/fevers or GU c/o. Taking meds as prescribed. Patient also notes some palpittiaons at times, this typically lasts minutes and does not have associated symptoms but she does feel very fatigued after these episodes. Also notes some occasional nausea without vomiting at times.   Patient Care Team: Mosie Lukes, MD as PCP - General (Family Medicine) Lafayette Dragon, MD (Inactive) as Consulting Physician (Gastroenterology)   Past Medical History:  Diagnosis Date  . Abdominal pain 09/13/2011  . Adenomatous colon polyp   . Blood transfusion   . ECG abnormal 11/19/2011  . Endometriosis 09/13/2011  . Foot pain, right 09/13/2011  . Hemorrhoids   . IBS (irritable bowel syndrome)   . IBS (irritable bowel syndrome)   . Migraines   . Nausea without vomiting 07/28/2014  . Neuropathy, sacral plexus 09/13/2011  . Osgood-Schlatter's disease 09/13/2011  . Preventative health  care 02/15/2016  . Rosacea 02/15/2016  . 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 Ulcer 1984   petic and duodenal    Past Surgical History:  Procedure Laterality Date  . ABDOMINAL HYSTERECTOMY    . ANKLE RECONSTRUCTION     left @ age 85  . AUGMENTATION MAMMAPLASTY    . BREAST ENHANCEMENT SURGERY    . BUNIONECTOMY     bilateral  . EYE SURGERY  01/23/2012   cosmetic lift, eyelids, b/l  . FOOT SURGERY     left twice  . FOOT SURGERY Left 05/2017  . KNEE ARTHROSCOPY     bilateral  3 on left 2 on right  . LAPAROSCOPIC HYSTERECTOMY  2000   total for extensive endometriosis  . LAPAROSCOPY     multiple times for endometriosis on all organs  . METATARSAL OSTEOTOMY  11/24/2011   Procedure: METATARSAL OSTEOTOMY;  Surgeon: Wallene Huh, DPM;  Location: [facility];  Service: Podiatry;  Laterality: Right;  RIGHT AIKENS OSTEOTOMY; RIGHT METATARSAL OSTETOMY Second & Third WITH SCREW; Right Second arthroplasty lesser metatarsalphalangeal with total implant, FUSION Third TOE RIGHT  . SPLENECTOMY  07/01/11  . TONSILLECTOMY AND ADENOIDECTOMY    . WRIST RECONSTRUCTION     left twice    Family History  Problem Relation Age of Onset  . Alzheimer's disease Maternal Grandmother   . Heart attack Maternal Grandfather   . Leukemia Paternal Grandfather   . Liver disease Sister   . Cirrhosis Sister  primary biliary cirrhosis  . Kidney disease Sister   . Cancer Sister        breast  . Breast cancer Sister   . Heart disease Father        rheumatic heart disease    Social History   Socioeconomic History  . Marital status: Single    Spouse name: Not on file  . Number of children: Not on file  . Years of education: Not on file  . Highest education level: Not on file  Occupational History  . Not on file  Social Needs  . Financial resource strain: Not on file  . Food insecurity:    Worry: Not on file    Inability: Not on file  . Transportation needs:    Medical: Not on file     Non-medical: Not on file  Tobacco Use  . Smoking status: Former Smoker    Packs/day: 0.00    Last attempt to quit: 11/16/2008    Years since quitting: 9.2  . Smokeless tobacco: Never Used  Substance and Sexual Activity  . Alcohol use: Yes    Alcohol/week: 3.6 oz    Types: 6 Cans of beer per week  . Drug use: Yes    Types: Marijuana    Comment: for pain  . Sexual activity: Never  Lifestyle  . Physical activity:    Days per week: Not on file    Minutes per session: Not on file  . Stress: Not on file  Relationships  . Social connections:    Talks on phone: Not on file    Gets together: Not on file    Attends religious service: Not on file    Active member of club or organization: Not on file    Attends meetings of clubs or organizations: Not on file    Relationship status: Not on file  . Intimate partner violence:    Fear of current or ex partner: Not on file    Emotionally abused: Not on file    Physically abused: Not on file    Forced sexual activity: Not on file  Other Topics Concern  . 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     Outpatient Medications Prior to Visit  Medication Sig Dispense Refill  . DOCOSAHEXAENOIC ACID PO Take 1 g by mouth.    . famciclovir (FAMVIR) 500 MG tablet Take 3 tablets at onset of symptoms.  Do not take more that 3 tablets per outbreak. 15 tablet 0  . fluticasone (FLONASE) 50 MCG/ACT nasal spray SHAKE LIQUID AND USE 2 SPRAYS IN EACH NOSTRIL DAILY AS NEEDED FOR ALLERGIES OR RHINITIS 16 g 5  . meloxicam (MOBIC) 15 MG tablet Take 1 tablet by mouth as needed.    . PROVENTIL HFA 108 (90 Base) MCG/ACT inhaler INHALE 2 PUFFS INTO THE LUNGS EVERY 6 HOURS AS NEEDED FOR WHEEZING OR SHORTNESS OF BREATH 6.7 g 0  . VIVELLE-DOT 0.1 MG/24HR Place 1 patch onto the skin every 3 (three) days.      No facility-administered medications prior to visit.     Allergies  Allergen Reactions  .  Demerol [Meperidine] Nausea And Vomiting  . Nucynta [Tapentadol Hydrochloride] Itching  . Adhesive [Tape] Rash    Review of Systems  Constitutional: Positive for malaise/fatigue. Negative for chills and fever.  HENT: Negative for congestion and hearing loss.   Eyes: Negative for discharge.  Respiratory: Positive   for shortness of breath and wheezing. Negative for cough and sputum production.   Cardiovascular: Positive for palpitations. Negative for chest pain and leg swelling.  Gastrointestinal: Positive for abdominal pain and nausea. Negative for blood in stool, constipation, diarrhea, heartburn and vomiting.  Genitourinary: Negative for dysuria, frequency, hematuria and urgency.  Musculoskeletal: Negative for back pain, falls and myalgias.  Skin: Negative for rash.  Neurological: Negative for dizziness, sensory change, loss of consciousness, weakness and headaches.  Endo/Heme/Allergies: Negative for environmental allergies. Does not bruise/bleed easily.  Psychiatric/Behavioral: Negative for depression and suicidal ideas. The patient is not nervous/anxious and does not have insomnia.        Objective:    Physical Exam  Constitutional: She is oriented to person, place, and time. No distress.  HENT:  Head: Normocephalic and atraumatic.  Right Ear: External ear normal.  Left Ear: External ear normal.  Nose: Nose normal.  Mouth/Throat: Oropharynx is clear and moist. No oropharyngeal exudate.  Eyes: Pupils are equal, round, and reactive to light. Conjunctivae are normal. Right eye exhibits no discharge. Left eye exhibits no discharge. No scleral icterus.  Neck: Normal range of motion. Neck supple. No thyromegaly present.  Cardiovascular: Normal rate, regular rhythm, normal heart sounds and intact distal pulses.  No murmur heard. Pulmonary/Chest: Effort normal and breath sounds normal. No respiratory distress. She has no wheezes. She has no rales.  Abdominal: Soft. Bowel sounds are  normal. She exhibits no distension and no mass. There is no tenderness.  Musculoskeletal: Normal range of motion. She exhibits no edema or tenderness.  Lymphadenopathy:    She has no cervical adenopathy.  Neurological: She is alert and oriented to person, place, and time. She has normal reflexes. She displays normal reflexes. No cranial nerve deficit. Coordination normal.  Skin: Skin is warm and dry. No rash noted. She is not diaphoretic.    BP 92/60 (BP Location: Left Arm, Patient Position: Sitting, Cuff Size: Normal)   Pulse 65   Temp 98.3 F (36.8 C) (Oral)   Resp 18   Ht 5' 6" (1.676 m)   Wt 136 lb 9.6 oz (62 kg)   SpO2 98%   BMI 22.05 kg/m  Wt Readings from Last 3 Encounters:  02/16/18 136 lb 9.6 oz (62 kg)  02/10/17 130 lb 6.4 oz (59.1 kg)  09/10/16 128 lb 8 oz (58.3 kg)   BP Readings from Last 3 Encounters:  02/16/18 92/60  02/10/17 104/71  09/10/16 122/86     Immunization History  Administered Date(s) Administered  . HiB (PRP-OMP) 05/11/2016  . Influenza Split 06/23/2012  . Influenza Whole 07/09/2011  . Influenza,inj,Quad PF,6+ Mos 07/02/2013, 06/11/2014  . Meningococcal B, OMV 05/11/2016, 07/13/2016  . Meningococcal Conjugate 07/09/2011, 04/06/2016  . Meningococcal Polysaccharide 02/09/2016  . Pneumococcal Conjugate-13 02/09/2016  . Pneumococcal Polysaccharide-23 07/09/2011, 04/06/2016  . Tdap 07/02/2013    Health Maintenance  Topic Date Due  . INFLUENZA VACCINE  04/27/2018  . MAMMOGRAM  05/03/2019  . TETANUS/TDAP  07/03/2023  . Hepatitis C Screening  Completed  . HIV Screening  Completed  . COLONOSCOPY  Discontinued    Lab Results  Component Value Date   WBC 7.0 02/16/2018   HGB 14.0 02/16/2018   HCT 41.6 02/16/2018   PLT 256.0 02/16/2018   GLUCOSE 91 02/16/2018   CHOL 200 02/16/2018   TRIG 53.0 02/16/2018   HDL 92.80 02/16/2018   LDLCALC 97 02/16/2018   ALT 17 02/16/2018   AST 23 02/16/2018   NA 139 02/16/2018  K 5.1 02/16/2018   CL 103  02/16/2018   CREATININE 0.85 02/16/2018   BUN 10 02/16/2018   CO2 30 02/16/2018   TSH 1.33 02/16/2018   INR 1.1 (H) 08/12/2016    Lab Results  Component Value Date   TSH 1.33 02/16/2018   Lab Results  Component Value Date   WBC 7.0 02/16/2018   HGB 14.0 02/16/2018   HCT 41.6 02/16/2018   MCV 96.9 02/16/2018   PLT 256.0 02/16/2018   Lab Results  Component Value Date   NA 139 02/16/2018   K 5.1 02/16/2018   CO2 30 02/16/2018   GLUCOSE 91 02/16/2018   BUN 10 02/16/2018   CREATININE 0.85 02/16/2018   BILITOT 1.0 02/16/2018   ALKPHOS 48 02/16/2018   AST 23 02/16/2018   ALT 17 02/16/2018   PROT 6.7 02/16/2018   ALBUMIN 4.2 02/16/2018   CALCIUM 10.1 02/16/2018   GFR 72.74 02/16/2018   Lab Results  Component Value Date   CHOL 200 02/16/2018   Lab Results  Component Value Date   HDL 92.80 02/16/2018   Lab Results  Component Value Date   LDLCALC 97 02/16/2018   Lab Results  Component Value Date   TRIG 53.0 02/16/2018   Lab Results  Component Value Date   CHOLHDL 2 02/16/2018   No results found for: HGBA1C       Assessment & Plan:   Problem List Items Addressed This Visit    Spleen absent    Checked measles titer and showed lack of immunity so she will be offered the MMR vaccine      Abdominal pain    She continues to have episodes of severe abdominal pain especially in her epigastrium. She is not a good candidate for surgery given all of her previous surgery so she manages well most days.       Nausea without vomiting   Preventative health care    Patient encouraged to maintain heart healthy diet, regular exercise, adequate sleep. Consider daily probiotics. Take medications as prescribed      Relevant Orders   CBC (Completed)   Comprehensive metabolic panel (Completed)   Lipid panel (Completed)   TSH (Completed)   Shortness of breath - Primary    Worsening over several months. She is now having to use a wedge to breath at night. She gets  easily winded with exertion and she starts to wheeze easily and needs her Albuterol inhaler frequently and it does help for a time. Will proceed with Echo and refer to pulmonology to try and determine the main cause of her symptoms.      Relevant Orders   Ambulatory referral to Pulmonology   ECHOCARDIOGRAM COMPLETE    Other Visit Diagnoses    Need for measles-mumps-rubella (MMR) vaccine       Relevant Orders   Measles ([place]) Antibody IgG (Completed)      I am having Chellsie L. Schroll start on fluticasone. I am also having her maintain her VIVELLE-DOT, DOCOSAHEXAENOIC ACID PO, fluticasone, famciclovir, PROVENTIL HFA, and meloxicam.  Meds ordered this encounter  Medications  . fluticasone (FLOVENT HFA) 110 MCG/ACT inhaler    Sig: Inhale 2 puffs into the lungs 2 (two) times daily.    Dispense:  1 Inhaler    Refill:  3    CMA served as scribe during this visit. History, Physical and Plan performed by medical provider. Documentation and orders reviewed and attested to.  [name], MD

## 2018-02-16 NOTE — Patient Instructions (Signed)
Preventive Care 40-64 Years, Female Preventive care refers to lifestyle choices and visits with your health care provider that can promote health and wellness. What does preventive care include?  A yearly physical exam. This is also called an annual well check.  Dental exams once or twice a year.  Routine eye exams. Ask your health care provider how often you should have your eyes checked.  Personal lifestyle choices, including: ? Daily care of your teeth and gums. ? Regular physical activity. ? Eating a healthy diet. ? Avoiding tobacco and drug use. ? Limiting alcohol use. ? Practicing safe sex. ? Taking low-dose aspirin daily starting at age 58. ? Taking vitamin and mineral supplements as recommended by your health care provider. What happens during an annual well check? The services and screenings done by your health care provider during your annual well check will depend on your age, overall health, lifestyle risk factors, and family history of disease. Counseling Your health care provider may ask you questions about your:  Alcohol use.  Tobacco use.  Drug use.  Emotional well-being.  Home and relationship well-being.  Sexual activity.  Eating habits.  Work and work Statistician.  Method of birth control.  Menstrual cycle.  Pregnancy history.  Screening You may have the following tests or measurements:  Height, weight, and BMI.  Blood pressure.  Lipid and cholesterol levels. These may be checked every 5 years, or more frequently if you are over 81 years old.  Skin check.  Lung cancer screening. You may have this screening every year starting at age 78 if you have a 30-pack-year history of smoking and currently smoke or have quit within the past 15 years.  Fecal occult blood test (FOBT) of the stool. You may have this test every year starting at age 65.  Flexible sigmoidoscopy or colonoscopy. You may have a sigmoidoscopy every 5 years or a colonoscopy  every 10 years starting at age 30.  Hepatitis C blood test.  Hepatitis B blood test.  Sexually transmitted disease (STD) testing.  Diabetes screening. This is done by checking your blood sugar (glucose) after you have not eaten for a while (fasting). You may have this done every 1-3 years.  Mammogram. This may be done every 1-2 years. Talk to your health care provider about when you should start having regular mammograms. This may depend on whether you have a family history of breast cancer.  BRCA-related cancer screening. This may be done if you have a family history of breast, ovarian, tubal, or peritoneal cancers.  Pelvic exam and Pap test. This may be done every 3 years starting at age 80. Starting at age 36, this may be done every 5 years if you have a Pap test in combination with an HPV test.  Bone density scan. This is done to screen for osteoporosis. You may have this scan if you are at high risk for osteoporosis.  Discuss your test results, treatment options, and if necessary, the need for more tests with your health care provider. Vaccines Your health care provider may recommend certain vaccines, such as:  Influenza vaccine. This is recommended every year.  Tetanus, diphtheria, and acellular pertussis (Tdap, Td) vaccine. You may need a Td booster every 10 years.  Varicella vaccine. You may need this if you have not been vaccinated.  Zoster vaccine. You may need this after age 5.  Measles, mumps, and rubella (MMR) vaccine. You may need at least one dose of MMR if you were born in  1959 or later. You may also need a second dose.  Pneumococcal 13-valent conjugate (PCV13) vaccine. You may need this if you have certain conditions and were not previously vaccinated.  Pneumococcal polysaccharide (PPSV23) vaccine. You may need one or two doses if you smoke cigarettes or if you have certain conditions.  Meningococcal vaccine. You may need this if you have certain  conditions.  Hepatitis A vaccine. You may need this if you have certain conditions or if you travel or work in places where you may be exposed to hepatitis A.  Hepatitis B vaccine. You may need this if you have certain conditions or if you travel or work in places where you may be exposed to hepatitis B.  Haemophilus influenzae type b (Hib) vaccine. You may need this if you have certain conditions.  Talk to your health care provider about which screenings and vaccines you need and how often you need them. This information is not intended to replace advice given to you by your health care provider. Make sure you discuss any questions you have with your health care provider. Document Released: 10/10/2015 Document Revised: 06/02/2016 Document Reviewed: 07/15/2015 Elsevier Interactive Patient Education  2018 [employer] Inc[employer]  

## 2018-02-17 LAB — RUBEOLA ANTIBODY IGG

## 2018-02-20 DIAGNOSIS — R0602 Shortness of breath: Secondary | ICD-10-CM | POA: Insufficient documentation

## 2018-02-20 NOTE — Assessment & Plan Note (Signed)
She continues to have episodes of severe abdominal pain especially in her epigastrium. She is not a good candidate for surgery given all of her previous surgery so she manages well most days.

## 2018-02-20 NOTE — Assessment & Plan Note (Signed)
Worsening over several months. She is now having to use a wedge to breath at night. She gets easily winded with exertion and she starts to wheeze easily and needs her Albuterol inhaler frequently and it does help for a time. Will proceed with Echo and refer to pulmonology to try and determine the main cause of her symptoms.

## 2018-02-20 NOTE — Assessment & Plan Note (Signed)
Checked measles titer and showed lack of immunity so she will be offered the MMR vaccine

## 2018-02-20 NOTE — Assessment & Plan Note (Signed)
Patient encouraged to maintain heart healthy diet, regular exercise, adequate sleep. Consider daily probiotics. Take medications as prescribed 

## 2018-03-07 ENCOUNTER — Other Ambulatory Visit (HOSPITAL_BASED_OUTPATIENT_CLINIC_OR_DEPARTMENT_OTHER): Payer: PRIVATE HEALTH INSURANCE

## 2018-03-15 ENCOUNTER — Ambulatory Visit (INDEPENDENT_AMBULATORY_CARE_PROVIDER_SITE_OTHER): Payer: PRIVATE HEALTH INSURANCE

## 2018-03-15 ENCOUNTER — Ambulatory Visit (HOSPITAL_BASED_OUTPATIENT_CLINIC_OR_DEPARTMENT_OTHER)
Admission: RE | Admit: 2018-03-15 | Discharge: 2018-03-15 | Disposition: A | Discharge status: Home / Self Care | Payer: PRIVATE HEALTH INSURANCE | Source: Ambulatory Visit | Attending: Family Medicine | Admitting: Family Medicine

## 2018-03-15 DIAGNOSIS — R0602 Shortness of breath: Secondary | ICD-10-CM | POA: Diagnosis not present

## 2018-03-15 DIAGNOSIS — R0600 Dyspnea, unspecified: Secondary | ICD-10-CM | POA: Diagnosis present

## 2018-03-15 DIAGNOSIS — Z23 Encounter for immunization: Secondary | ICD-10-CM

## 2018-03-15 NOTE — Progress Notes (Signed)
Echocardiogram 2D Echocardiogram has been performed.  Annette Snow 03/15/2018, 10:05 AM

## 2018-03-21 ENCOUNTER — Other Ambulatory Visit (INDEPENDENT_AMBULATORY_CARE_PROVIDER_SITE_OTHER): Payer: PRIVATE HEALTH INSURANCE

## 2018-03-21 ENCOUNTER — Ambulatory Visit (INDEPENDENT_AMBULATORY_CARE_PROVIDER_SITE_OTHER): Payer: PRIVATE HEALTH INSURANCE | Admitting: Internal Medicine

## 2018-03-21 ENCOUNTER — Encounter: Payer: Self-pay | Admitting: Internal Medicine

## 2018-03-21 VITALS — BP 124/76 | HR 66 | Ht 66.0 in | Wt 135.8 lb

## 2018-03-21 DIAGNOSIS — J45991 Cough variant asthma: Secondary | ICD-10-CM | POA: Diagnosis not present

## 2018-03-21 LAB — CBC WITH DIFFERENTIAL/PLATELET
BASOS PCT: 1.5 % (ref 0.0–3.0)
Basophils Absolute: 0.1 10*3/uL (ref 0.0–0.1)
EOS ABS: 0.2 10*3/uL (ref 0.0–0.7)
EOS PCT: 3.2 % (ref 0.0–5.0)
HEMATOCRIT: 40.7 % (ref 36.0–46.0)
HEMOGLOBIN: 13.8 g/dL (ref 12.0–15.0)
LYMPHS PCT: 46.1 % — AB (ref 12.0–46.0)
Lymphs Abs: 3.2 10*3/uL (ref 0.7–4.0)
MCHC: 34 g/dL (ref 30.0–36.0)
MCV: 95.7 fl (ref 78.0–100.0)
Monocytes Absolute: 0.6 10*3/uL (ref 0.1–1.0)
Monocytes Relative: 9.4 % (ref 3.0–12.0)
NEUTROS ABS: 2.7 10*3/uL (ref 1.4–7.7)
Neutrophils Relative %: 39.8 % — ABNORMAL LOW (ref 43.0–77.0)
PLATELETS: 245 10*3/uL (ref 150.0–400.0)
RBC: 4.25 Mil/uL (ref 3.87–5.11)
RDW: 13 % (ref 11.5–15.5)
WBC: 6.8 10*3/uL (ref 4.0–10.5)

## 2018-03-21 LAB — NITRIC OXIDE: NITRIC OXIDE: 10

## 2018-03-21 MED ORDER — MONTELUKAST SODIUM 10 MG PO TABS: 10.0000 mg | ORAL_TABLET | Freq: Every day | ORAL | 11 refills | Status: DC

## 2018-03-21 NOTE — Patient Instructions (Addendum)
Work on inhaler technique:  relax and gently blow all the way out then take a nice smooth deep breath back in, triggering the inhaler at same time you start breathing in.  Hold for up to 5 seconds if you can. Blow out thru nose. Rinse and gargle with water when done     Only use your albuterol (proventil) as a rescue medication to be used if you can't catch your breath by resting or doing a relaxed purse lip breathing pattern.  - The less you use it, the better it will work when you need it. - Ok to use up to 2 puffs  every 4 hours if you must but call for immediate appointment if use goes up over your usual need - Don't leave home without it !!  (think of it like the spare tire for your car)    If you need your proventil more than twice week or wake up with respiratory symptoms more than twice a month you will need a maintenance medication and since you didn't tolerate flovent I would recommend you try singulair 10 mg each pm   GERD (REFLUX)  is an extremely common cause of respiratory symptoms just like yours , many times with no obvious heartburn at all.    It can be treated with medication, but also with lifestyle changes including elevation of the head of your bed (ideally with 6 inch  bed blocks),  Smoking cessation, avoidance of late meals, excessive alcohol, and avoid fatty foods, chocolate, peppermint, colas, red wine, and acidic juices such as orange juice.  NO MINT OR MENTHOL PRODUCTS SO NO COUGH DROPS  USE SUGARLESS CANDY INSTEAD (Jolley ranchers or Stover's or Life Savers) or even ice chips will also do - the key is to swallow to prevent all throat clearing. NO OIL BASED VITAMINS - use powdered substitutes.     Please remember to go to the lab department downstairs in the basement  for your tests - we will call you with the results when they are available.      Please schedule a follow up visit in 3 months but call sooner if needed

## 2018-03-21 NOTE — Progress Notes (Signed)
Subjective:     Patient ID: Annette Snow, female   DOB: March 17, 1961,     MRN: [id number]  HPI  57 yowf  Quit smoking age 57 "seasonal allergies"  Childhood mostly runny nose gradually got better no symptoms by HS but by 30's trouble breathing while in [place] rx allergy shots / inhalers seemed to helped some but rarely needed   better in [place] no symptoms no need for meds  Then moved to [place] 2008 some eye running then rhinitis symptoms some better with flonase then breathing problem started fall 2017 for which albuterol initially helped a lot but then overuse until started flovent 110 improved but poorly tolerant so referred to pulmonary clinic 03/21/2018 by Dr   Annette Snow.    03/21/2018 1st [facility] office visit/ Annette Snow   Chief Complaint  Patient presents with  . Pulmonary Consult    Referred by Dr. Vivien Snow.  Pt c/o SOB for the past 9 months. She gets winded walking up hill and occ wakes up in the night feeling SOB. She uses her albuterol inhaler once per wk on average.   much better since started flovent 110  but then difficulty swallowing and stopped it completely one week prior to OV   And continues with doe x hills = MMRC1 = can walk nl pace, flat grade, can't hurry or go uphills or steps s sob  And also noct spells sev times a month of sob better with saba usually assoc with some coughing mostly dry and nasal congestion as well.    No  obvious day to day or daytime variability or assoc excess/ purulent sputum or mucus plugs or hemoptysis or cp or chest tightness, subjective wheeze or overt   hb symptoms.   Sleeping on "7 in wedge"   without nocturnal  or early am exacerbation  of respiratory  c/o's or need for noct saba. Also denies any obvious fluctuation of symptoms with weather or environmental changes or other aggravating or alleviating factors except as outlined above   No unusual exposure hx or h/o childhood pna/ asthma or knowledge of premature birth.  Current  Allergies, Complete Past Medical History, Past Surgical History, Family History, and Social History were reviewed in [employer] record.  ROS  The following are not active complaints unless bolded Hoarseness, sore throat, dysphagia, dental problems, itching, sneezing,  nasal congestion or discharge of excess mucus or purulent secretions, ear ache,   fever, chills, sweats, unintended wt loss or wt gain, classically pleuritic or exertional cp,  orthopnea pnd or arm/hand swelling  or leg swelling, presyncope, palpitations, abdominal pain, anorexia, nausea, vomiting, diarrhea  or change in bowel habits or change in bladder habits, change in stools or change in urine, dysuria, hematuria,  rash, arthralgias, visual complaints, headache, numbness, weakness or ataxia or problems with walking or coordination,  change in mood or  memory.        Current Meds     Medication Sig  . famciclovir (FAMVIR) 500 MG tablet Take 3 tablets at onset of symptoms.  Do not take more that 3 tablets per outbreak.  . fluticasone (FLONASE) 50 MCG/ACT nasal spray SHAKE LIQUID AND USE 2 SPRAYS IN EACH NOSTRIL DAILY AS NEEDED FOR ALLERGIES OR RHINITIS  . meloxicam (MOBIC) 15 MG tablet Take 1 tablet by mouth as needed.  Marland Kitchen PROVENTIL HFA 108 (90 Base) MCG/ACT inhaler INHALE 2 PUFFS INTO THE LUNGS EVERY 6 HOURS AS NEEDED FOR WHEEZING OR SHORTNESS OF  BREATH  . VIVELLE-DOT 0.1 MG/24HR Place 1 patch onto the skin every 3 (three) days.       Review of Systems     Objective:   Physical Exam    amb somber wf nad   Wt Readings from Last 3 Encounters:  03/21/18 135 lb 12.8 oz (61.6 kg)  02/16/18 136 lb 9.6 oz (62 kg)  02/10/17 130 lb 6.4 oz (59.1 kg)     Vital signs reviewed - Note on arrival 02 sats  100% on RA       HEENT: nl dentition, turbinates bilaterally, and oropharynx. Nl external ear canals without cough reflex   NECK :  without JVD/Nodes/TM/ nl carotid upstrokes bilaterally   LUNGS: no  acc muscle use,  Nl contour chest which is clear to A and P bilaterally without cough on insp or exp maneuvers   CV:  RRR  no s3 or murmur or increase in P2, and no edema   ABD:  soft and nontender with nl inspiratory excursion in the supine position. No bruits or organomegaly appreciated, bowel sounds nl  MS:  Nl gait/ ext warm without deformities, calf tenderness, cyanosis or clubbing No obvious joint restrictions   SKIN: warm and dry without lesions    NEURO:  alert, approp, nl sensorium with  no motor or cerebellar deficits apparent.       Labs ordered 03/21/2018   Allergy profile     Assessment:

## 2018-03-22 ENCOUNTER — Encounter: Payer: Self-pay | Admitting: Internal Medicine

## 2018-03-22 LAB — RESPIRATORY ALLERGY PROFILE REGION II ~~LOC~~
Allergen, Cedar tree, t12: <0.1 kU/L
Allergen, D pternoyssinus,d7: <0.1 kU/L
Allergen, Mouse Urine Protein, e78: <0.1 kU/L
Allergen, Mulberry, t76: <0.1 kU/L
Allergen, Oak,t7: <0.1 kU/L
Aspergillus fumigatus, m3: <0.1 kU/L
CLASS: 0
CLASS: 0
CLASS: 0
CLASS: 0
CLASS: 0
CLASS: 0
CLASS: 0
CLASS: 0
Cat Dander: <0.1 kU/L
Class: 0
Class: 0
Class: 0
Class: 0
Class: 0
Class: 0
Class: 0
Class: 0
Class: 0
Class: 0
Class: 0
Class: 0
Class: 0
Class: 0
Class: 0
Class: 0
Cockroach: <0.1 kU/L
IGE (IMMUNOGLOBULIN E), SERUM: 15 kU/L (ref <=114)
Rough Pigweed  IgE: <0.1 kU/L
Sheep Sorrel IgE: <0.1 kU/L
Timothy Grass: <0.1 kU/L

## 2018-03-22 LAB — INTERPRETATION:

## 2018-03-22 NOTE — Assessment & Plan Note (Addendum)
Allergy profile 03/21/2018 >  Eos 0.2 /  IgE  Pending   - FENO 03/21/2018  =   10  - Spirometry 03/21/2018  FEV1 2.66 (96%)  Ratio 78 no significant curvature off all rx  - 03/21/2018  After extensive coaching inhaler device  effectiveness =    75% from a baseline of 25%   - 03/21/2018 rec add singulair trial if breaks the rule of 2's (last took in spring of 2016 not clear whether responded)   I suspect the reason she didn't tolerate ICS well = poor technique but since this was the case I rec rechallenge with singulair prior to restarting any form of ICS but best choice would probably be asmanex 100 1 or 2 bid or qvar 40 2bid in terms of irritating her airway but neither warranted now as not clear any of her present symptoms are related to asthma and might just as well be related to uacs/gerd (where we frequently see very poor tolerance of ICS especially fluticasone but usually in the dpi form)  for which I rec she first try diet and leave the ics off for now and await allergy studies/ f/u in 3 m but sooner prn     Total time devoted to counseling  > 50 % of initial 60 min office visit:  review case with pt/ discussion of options/alternatives/ personally creating written customized instructions  in presence of pt  then going over those specific  Instructions directly with the pt including how to use all of the meds but in particular covering each new medication in detail and the difference between the maintenance= "automatic" meds and the prns using an action plan format for the latter (If this problem/symptom => do that organization reading Left to right).  Please see AVS from this visit for a full list of these instructions which I personally wrote for this pt and  are unique to this visit.   See device teaching which extended face to face time for this visit - rule of two's also reviewed in detail and reflected in AVS

## 2018-03-23 ENCOUNTER — Telehealth: Payer: Self-pay | Admitting: Internal Medicine

## 2018-03-23 NOTE — Telephone Encounter (Signed)
Tanda Rockers, MD  Rosana Berger, CMA        Call patient : Studies are unremarkable, no change in recs      [facility]

## 2018-03-24 NOTE — Telephone Encounter (Signed)
Called and spoke with patient regarding results.  Informed the patient of results and recommendations today. Pt would like a more detailed break down of her labs that were drawn by MW request. Pt states that they were trying to figure out triggers, and would like call back by MW.  MW please advise.

## 2018-03-24 NOTE — Telephone Encounter (Signed)
Annette Snow was neg meaning no obvious allergic trigger we can identify but this doesn't mean there aren't other triggers she should be on the look out for eg fumes, smoke, colognes etc that she can try to  avoid  - we'll discuss at next ov in more detail depending on her response to what I've already recommended

## 2018-03-24 NOTE — Telephone Encounter (Signed)
lmtcb

## 2018-03-24 NOTE — Telephone Encounter (Signed)
Called and spoke with patient regarding results.  Informed the patient of results and recommendations today. Pt verbalized understanding and denied any questions or concerns at this time.  Nothing further needed.  

## 2018-03-28 ENCOUNTER — Other Ambulatory Visit: Payer: Self-pay | Admitting: Family Medicine

## 2018-03-28 DIAGNOSIS — Z1231 Encounter for screening mammogram for malignant neoplasm of breast: Secondary | ICD-10-CM

## 2018-04-11 ENCOUNTER — Telehealth: Payer: Self-pay | Admitting: Family Medicine

## 2018-04-11 ENCOUNTER — Other Ambulatory Visit: Payer: Self-pay | Admitting: Family Medicine

## 2018-04-11 MED ORDER — ESTRADIOL 0.1 MG/24HR TD PTTW: 1.0000 | MEDICATED_PATCH | TRANSDERMAL | 11 refills | Status: DC

## 2018-04-11 NOTE — Telephone Encounter (Signed)
I sent the refill in to the pharmacy, this is the first time I have seen this

## 2018-04-11 NOTE — Telephone Encounter (Signed)
This is the only request for estradiol.. I do not see where she is on the patch. I do see vivelle-dot 0.1 mg/24hr prescribed in 2012    Please advise

## 2018-04-11 NOTE — Telephone Encounter (Signed)
Copied from [place] [id number]. Topic: Quick Communication - Rx Refill/Question >> Apr 11, 2018  3:18 PM Burchel, Abbi R wrote: Medication: Estradiol 0.1 patches   Preferred Pharmacy: [facility] [place], [address] [facility] [address]  [phone] (Phone) [phone] (Fax)   Pt has requested this rx on 03/27/18.  Pt  states there have been several delays, pt is completely out of patches.  Pt was advised that RX refills may take up to 3 business days.

## 2018-04-28 IMAGING — US US EXTREM LOW VENOUS*L*
1 series · 13 of 24 positions shown · non-contrast
Comparison: None.

CLINICAL DATA: Left lower extremity pain and edema.



[Series 1: us extrem low venous*left* · 0.07mm/px · 13 of 27 slices shown]
[im 1/27]
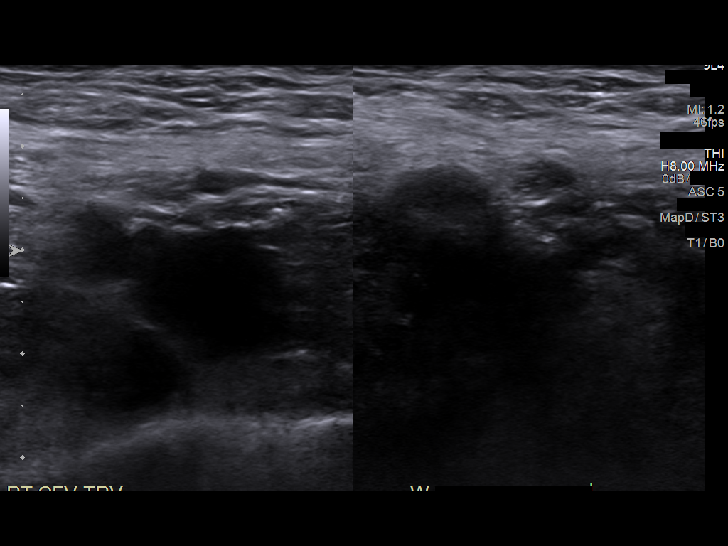
[im 3/27]
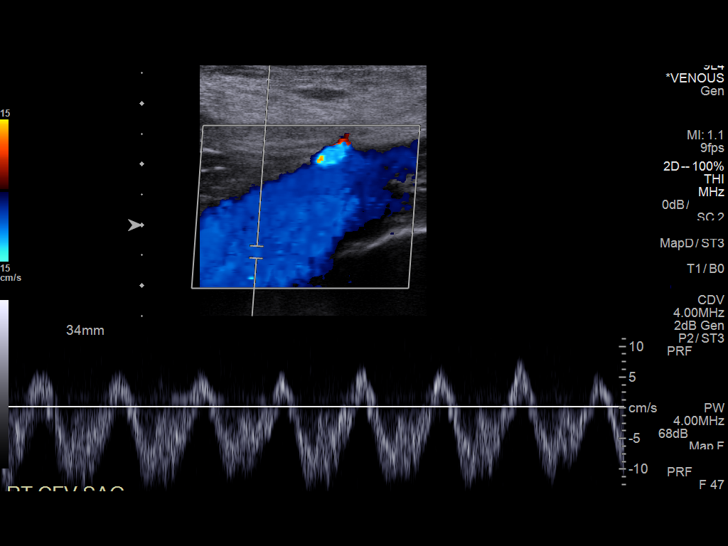
[im 5/27]
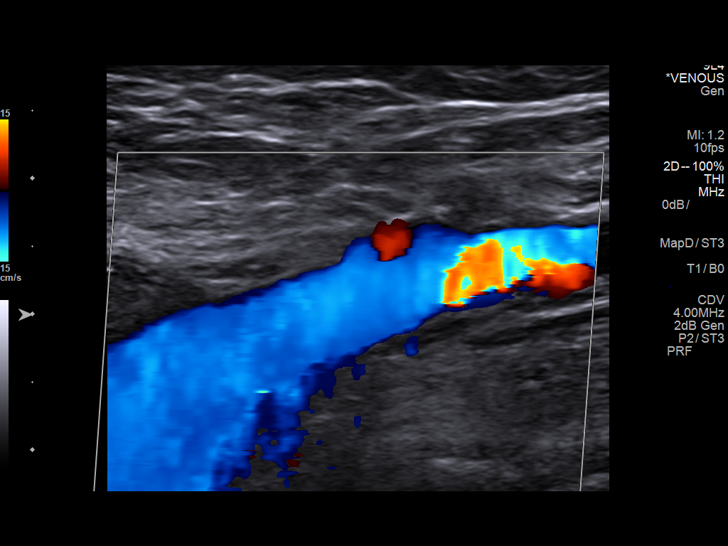
[im 7/27]
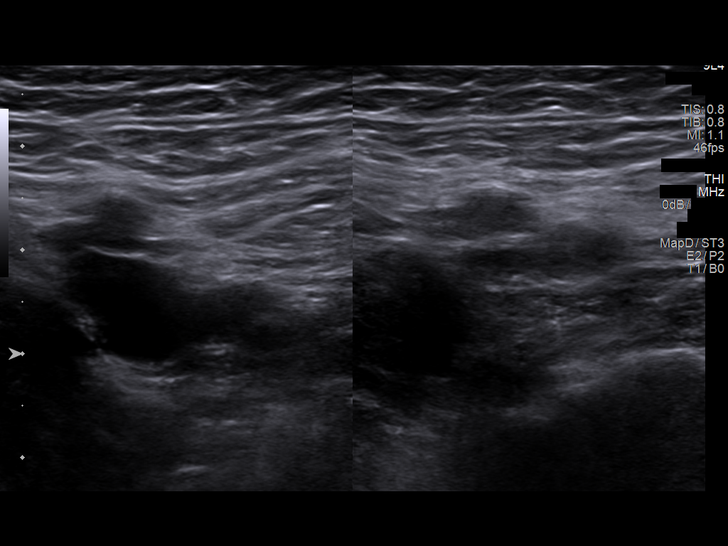
[im 10/27]
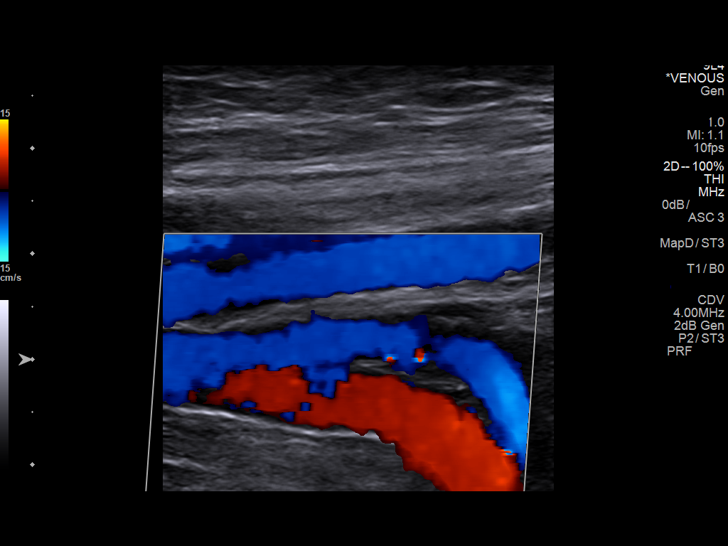
[im 12/27]
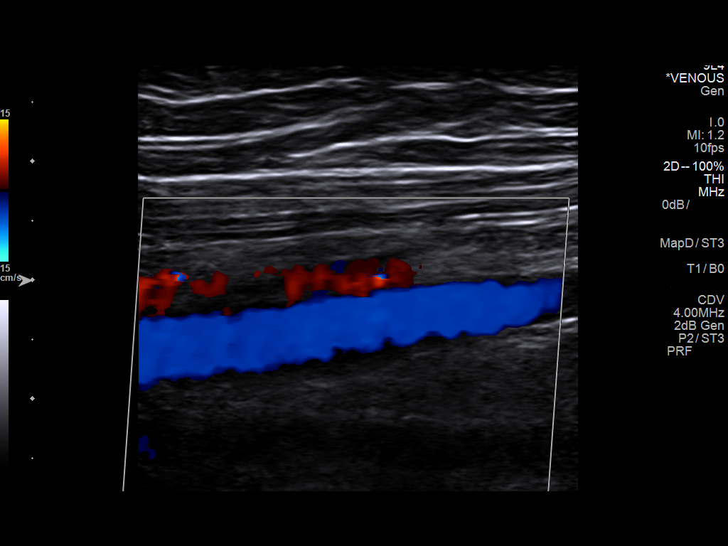
[im 14/27]
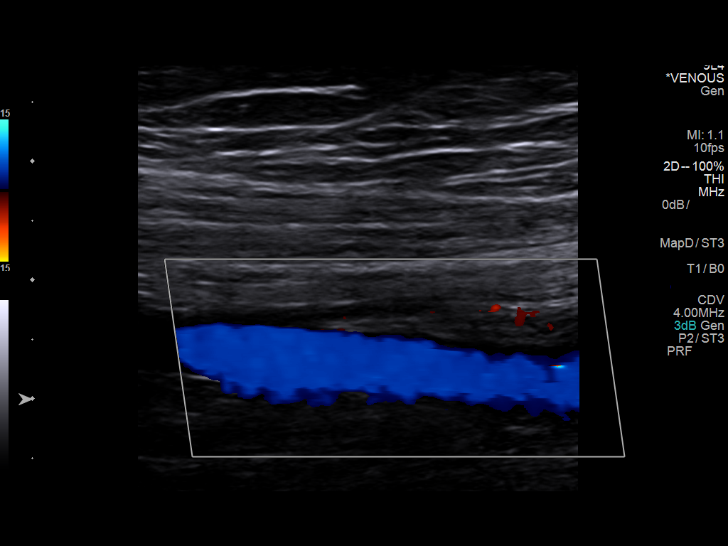
[im 15/27]
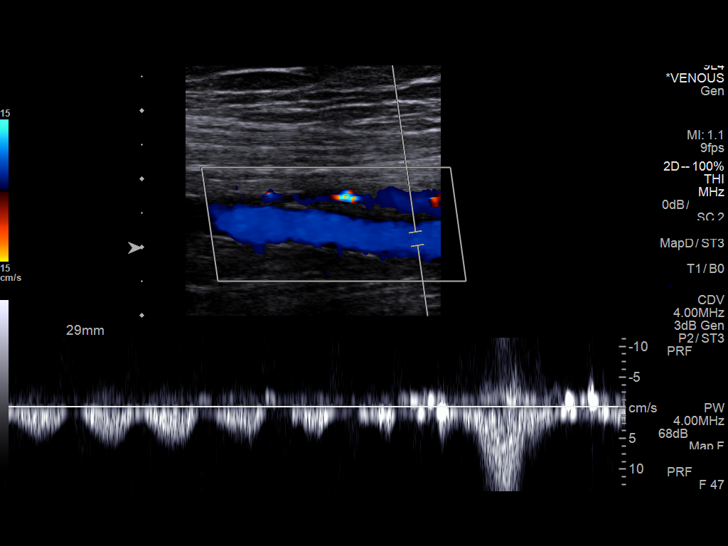
[im 17/27]
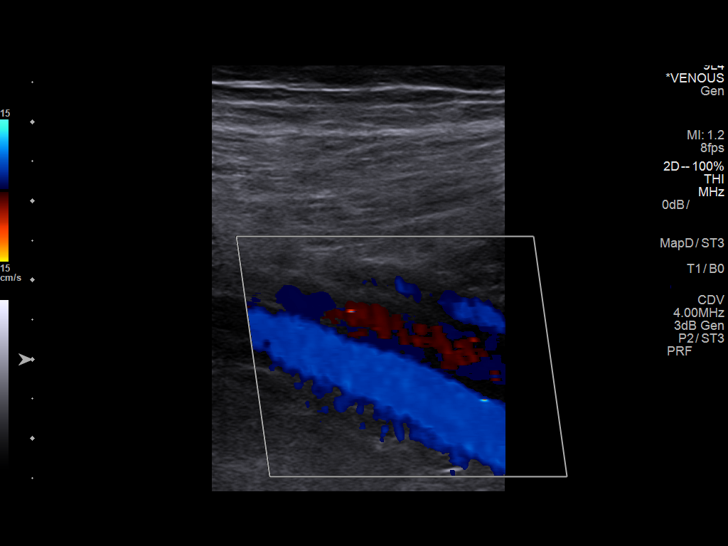
[im 20/27]
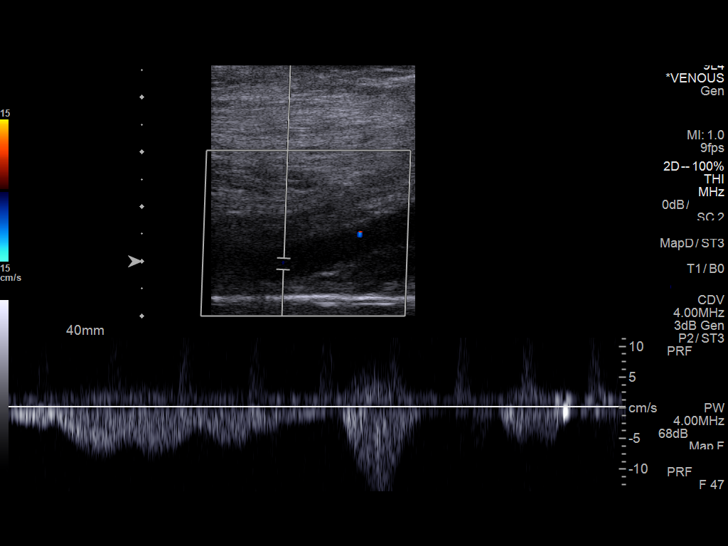
[im 22/27]
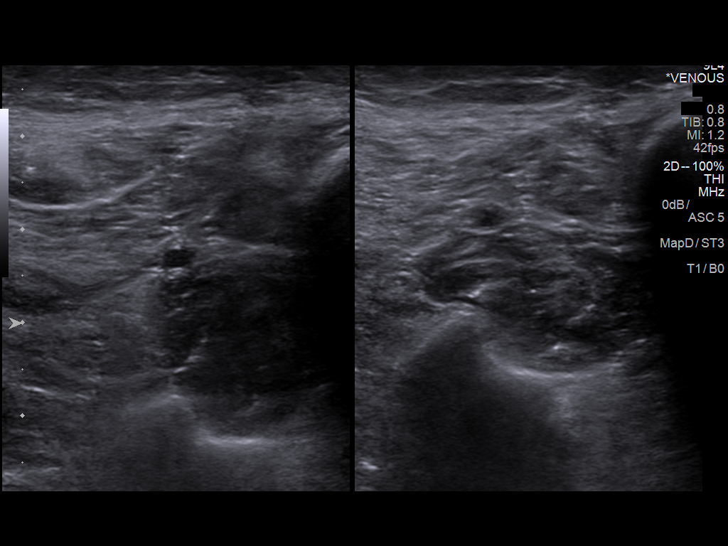
[im 24/27]
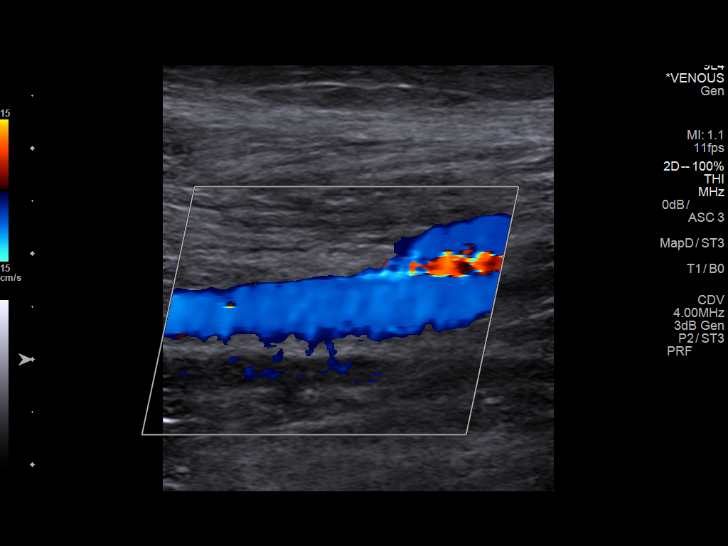
[im 27/27]
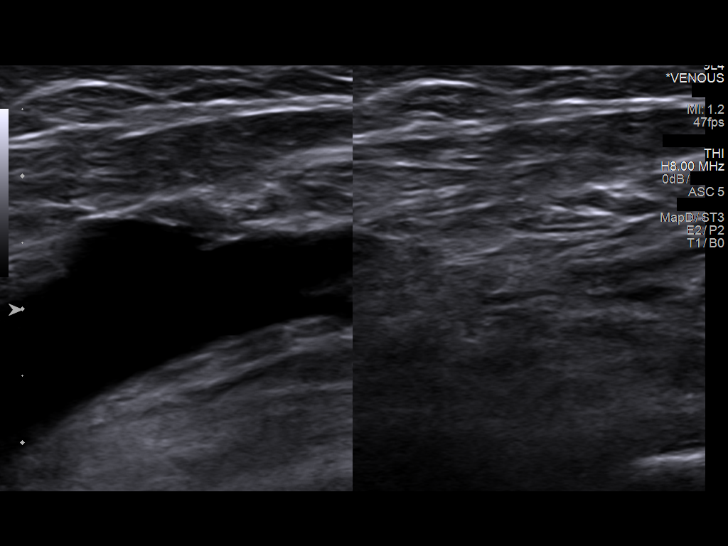

[13 of 24 positions shown; findings below may reference images not displayed]

FINDINGS: Contralateral Common Femoral Vein: Respiratory phasicity is normal
and symmetric with the symptomatic side. No evidence of thrombus.
Normal compressibility.

Common Femoral Vein: No evidence of thrombus. Normal
compressibility, respiratory phasicity and response to augmentation.

Saphenofemoral Junction: No evidence of thrombus. Normal
compressibility and flow on color Doppler imaging.

Profunda Femoral Vein: No evidence of thrombus. Normal
compressibility and flow on color Doppler imaging.

Femoral Vein: No evidence of thrombus. Normal compressibility,
respiratory phasicity and response to augmentation.

Popliteal Vein: No evidence of thrombus. Normal compressibility,
respiratory phasicity and response to augmentation.

Calf Veins: No evidence of thrombus. Normal compressibility and flow
on color Doppler imaging.

Superficial Great Saphenous Vein: Diminutive left great saphenous
vein. This may be developmental or reflective of prior stripping or
endo venous ablation.

Venous Reflux:  None.

Other Findings: No evidence of superficial thrombophlebitis or
abnormal fluid collection.
IMPRESSION: No evidence of left lower extremity deep venous thrombosis.

## 2018-05-30 ENCOUNTER — Ambulatory Visit: Payer: PRIVATE HEALTH INSURANCE

## 2018-06-21 ENCOUNTER — Ambulatory Visit: Payer: PRIVATE HEALTH INSURANCE

## 2018-06-21 ENCOUNTER — Ambulatory Visit: Payer: PRIVATE HEALTH INSURANCE | Admitting: Internal Medicine

## 2018-06-26 ENCOUNTER — Ambulatory Visit (INDEPENDENT_AMBULATORY_CARE_PROVIDER_SITE_OTHER): Payer: 59 | Admitting: Internal Medicine

## 2018-06-26 ENCOUNTER — Encounter: Payer: Self-pay | Admitting: Internal Medicine

## 2018-06-26 VITALS — BP 114/70 | HR 80 | Ht 66.0 in | Wt 136.0 lb

## 2018-06-26 DIAGNOSIS — J45991 Cough variant asthma: Secondary | ICD-10-CM

## 2018-06-26 NOTE — Patient Instructions (Signed)
Only use your albuterol as a rescue medication to be used if you can't catch your breath by resting or doing a relaxed purse lip breathing pattern.  - The less you use it, the better it will work when you need it. - Ok to use up to 2 puffs  every 4 hours if you must but call for immediate appointment if use goes up over your usual need - Don't leave home without it !!  (think of it like the spare tire for your car)   If allergies worsen I can refer you to Dr Bruna Potter group for further evaluation for longterm control     If you are satisfied with your treatment plan,  let your doctor know and he/she can either refill your medications or you can return here when your prescription runs out.     If in any way you are not 100% satisfied,  please tell us.  If 100% better, tell your friends!  Pulmonary follow up is as needed

## 2018-06-26 NOTE — Progress Notes (Signed)
Subjective:     Patient ID: Annette Snow, female   DOB: [birth year],     MRN: [id number]    Brief patient profile:  57 yowf  Quit smoking age 57 "seasonal allergies"  Childhood mostly runny nose gradually got better no symptoms by HS but by 30's trouble breathing while in [place] rx allergy shot x several years  / inhalers seemed to helped some but rarely needed prednisone  better in [place] no symptoms no need for meds  Then moved to [place] 2008 some eye running then rhinitis symptoms some better with flonase then breathing problem started fall 2017 for which albuterol initially helped a lot but then overused it  until started flovent 110 improved but poorly tolerant so referred to [facility] 03/21/2018 by Dr   Annette Snow.     .History of Present Illness  03/21/2018 1st Annette Snow/ Annette Snow   Chief Complaint  Patient presents with  . [facility] Consult    Referred by Dr. Vivien Snow.  Pt c/o SOB for the past 9 months. She gets winded walking up hill and occ wakes up in the night feeling SOB. She uses her albuterol inhaler once per wk on average.   much better since started flovent 110  but then difficulty swallowing and stopped it completely one week prior to OV   And continues with doe x hills = MMRC1 = can walk nl pace, flat grade, can't hurry or go uphills or steps s sob  And also noct spells sev times a month of sob better with saba usually assoc with some coughing mostly dry and nasal congestion as well.   Sleeping on "7 in wedge" < 30 degrees rec Work on inhaler technique:  Only use your albuterol (proventil) as a rescue medication If you need your proventil more than twice week  GERD diet      06/26/2018  f/u ov/Annette Snow re:  ? Asthma  No symptoms off all meds  Chief Complaint  Patient presents with  . Follow-up    Breathing is overall doing well. She has not had to use her rescue inhaler.   Dyspnea:  Not limited by breathing from desired activities  / good  aerobics  Cough: no Sleeping: < 30 degrees SABA use: no need at all    No obvious day to day or daytime variability or assoc excess/ purulent sputum or mucus plugs or hemoptysis or cp or chest tightness, subjective wheeze or overt sinus or hb symptoms.   Sleeping as above without nocturnal  or early am exacerbation  of respiratory  c/o's or need for noct saba. Also denies any obvious fluctuation of symptoms with weather or environmental changes or other aggravating or alleviating factors except as outlined above   No unusual exposure hx or h/o childhood pna/ asthma or knowledge of premature birth.  Current Allergies, Complete Past Medical History, Past Surgical History, Family History, and Social History were reviewed in [employer] record.  ROS  The following are not active complaints unless bolded Hoarseness, sore throat, dysphagia, dental problems, itching, sneezing,  nasal congestion or discharge of excess mucus or purulent secretions, ear ache,   fever, chills, sweats, unintended wt loss or wt gain, classically pleuritic or exertional cp,  orthopnea pnd or arm/hand swelling  or leg swelling, presyncope, palpitations, abdominal pain, anorexia, nausea, vomiting, diarrhea  or change in bowel habits or change in bladder habits, change in stools or change in urine, dysuria, hematuria,  rash, arthralgias,  visual complaints, headache, numbness, weakness or ataxia or problems with walking or coordination,  change in mood or  memory.        Current Meds  Medication Sig  . estradiol (VIVELLE-DOT) 0.1 MG/24HR patch Place 1 patch (0.1 mg total) onto the skin every 3 (three) days.  . famciclovir (FAMVIR) 500 MG tablet Take 3 tablets at onset of symptoms.  Do not take more that 3 tablets per outbreak.  . fluticasone (FLONASE) 50 MCG/ACT nasal spray SHAKE LIQUID AND USE 2 SPRAYS IN EACH NOSTRIL DAILY AS NEEDED FOR ALLERGIES OR RHINITIS  . meloxicam (MOBIC) 15 MG tablet Take 1  tablet by mouth as needed.  Marland Kitchen PROVENTIL HFA 108 (90 Base) MCG/ACT inhaler INHALE 2 PUFFS INTO THE LUNGS EVERY 6 HOURS AS NEEDED FOR WHEEZING OR SHORTNESS OF BREATH                     Objective:   Physical Exam  amb pleasant wf nad   06/26/2018       135   03/21/18 135 lb 12.8 oz (61.6 kg)  02/16/18 136 lb 9.6 oz (62 kg)  02/10/17 130 lb 6.4 oz (59.1 kg)     Vital signs reviewed - Note on arrival 02 sats  96% on RA      HEENT: nl dentition, turbinates bilaterally, and oropharynx. Nl external ear canals without cough reflex   NECK :  without JVD/Nodes/TM/ nl carotid upstrokes bilaterally   LUNGS: no acc muscle use,  Nl contour chest which is clear to A and P bilaterally without cough on insp or exp maneuvers   CV:  RRR  no s3 or murmur or increase in P2, and no edema   ABD:  soft and nontender with nl inspiratory excursion in the supine position. No bruits or organomegaly appreciated, bowel sounds nl  MS:  Nl gait/ ext warm without deformities, calf tenderness, cyanosis or clubbing No obvious joint restrictions   SKIN: warm and dry without lesions    NEURO:  alert, approp, nl sensorium with  no motor or cerebellar deficits apparent.         Assessment:

## 2018-06-28 ENCOUNTER — Encounter: Payer: Self-pay | Admitting: Internal Medicine

## 2018-06-28 NOTE — Assessment & Plan Note (Addendum)
Allergy profile 03/21/2018 >  Eos 0.2 /  IgE 15 RAST neg    - FENO 03/21/2018  =   10  - Spirometry 03/21/2018  FEV1 2.66 (96%)  Ratio 78 no significant curvature off all rx  - 03/21/2018  After extensive coaching inhaler device  effectiveness =    75% from a baseline of 25%   - 03/21/2018 rec add singulair trial if breaks the rule of 2's (last took in spring of 2016 not clear whether responded)    All goals of chronic asthma control met including optimal function and elimination of symptoms with minimal need for rescue therapy s need for maint rx   Contingencies discussed in full including contacting this office immediately if not controlling the symptoms using the rule of two's   F/u here or allergy prn

## 2018-07-04 ENCOUNTER — Ambulatory Visit
Admission: RE | Admit: 2018-07-04 | Discharge: 2018-07-04 | Disposition: A | Discharge status: Home / Self Care | Payer: PRIVATE HEALTH INSURANCE | Source: Ambulatory Visit | Attending: Family Medicine | Admitting: Family Medicine

## 2018-07-04 DIAGNOSIS — Z1231 Encounter for screening mammogram for malignant neoplasm of breast: Secondary | ICD-10-CM

## 2018-08-04 ENCOUNTER — Other Ambulatory Visit: Payer: Self-pay | Admitting: Plastic Surgery

## 2018-08-13 ENCOUNTER — Other Ambulatory Visit: Payer: Self-pay | Admitting: Family Medicine

## 2018-08-13 DIAGNOSIS — T7840XS Allergy, unspecified, sequela: Secondary | ICD-10-CM

## 2019-01-09 ENCOUNTER — Other Ambulatory Visit: Payer: Self-pay | Admitting: Family Medicine

## 2019-01-11 ENCOUNTER — Other Ambulatory Visit: Payer: Self-pay | Admitting: Family Medicine

## 2019-01-11 DIAGNOSIS — T7840XS Allergy, unspecified, sequela: Secondary | ICD-10-CM

## 2019-02-22 ENCOUNTER — Ambulatory Visit (INDEPENDENT_AMBULATORY_CARE_PROVIDER_SITE_OTHER): Payer: BLUE CROSS/BLUE SHIELD | Admitting: Family Medicine

## 2019-02-22 ENCOUNTER — Other Ambulatory Visit: Payer: Self-pay

## 2019-02-22 DIAGNOSIS — Z8619 Personal history of other infectious and parasitic diseases: Secondary | ICD-10-CM

## 2019-02-22 DIAGNOSIS — K589 Irritable bowel syndrome without diarrhea: Secondary | ICD-10-CM

## 2019-02-22 DIAGNOSIS — Q8901 Asplenia (congenital): Secondary | ICD-10-CM

## 2019-02-22 DIAGNOSIS — J45991 Cough variant asthma: Secondary | ICD-10-CM

## 2019-02-25 DIAGNOSIS — Z8619 Personal history of other infectious and parasitic diseases: Secondary | ICD-10-CM | POA: Insufficient documentation

## 2019-02-25 NOTE — Progress Notes (Addendum)
Virtual Visit via Video Note  02/22/2019 at  9:40 AM EDT by a video enabled telemedicine application and verified that I am speaking with the correct person using two identifiers.  Location: Patient: home Provider: office   I discussed the limitations of evaluation and management by telemedicine and the availability of in person appointments. The patient expressed understanding and agreed to proceed. Princess Eulas Post CMA was able to get patient set up on video platform    Subjective:    Patient ID: KARINA NOFSINGER, female    DOB: 1961-03-18, 58 y.o.   MRN: [id number]  No chief complaint on file.   HPI Patient is in today for follow up on chronic medical concerns including asplenia, IBS, allergies and more. She feels well today. She is able to stay home as needed during the pandemic due to retirement. Able to social distance well. Denies CP/palp/SOB/HA/fevers/GI or GU c/o. Taking meds as prescribed. Notes some congestion and abdominal pain at times but manageable. Denies CP/palp/SOB/HA/fevers or GU c/o. 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   Ulcer 1984   petic and duodenal    Past Surgical History:  Procedure Laterality Date   ABDOMINAL HYSTERECTOMY     ANKLE RECONSTRUCTION     left @ age 85   AUGMENTATION MAMMAPLASTY Bilateral    BREAST ENHANCEMENT SURGERY     BUNIONECTOMY     bilateral   EYE SURGERY  01/23/2012   cosmetic lift, eyelids, b/l   FOOT SURGERY     left  twice   FOOT SURGERY Left 05/2017   KNEE ARTHROSCOPY     bilateral  3 on left 2 on right   LAPAROSCOPIC HYSTERECTOMY  2000   total for extensive endometriosis   LAPAROSCOPY     multiple times for endometriosis on all organs   METATARSAL OSTEOTOMY  11/24/2011   Procedure: METATARSAL OSTEOTOMY;  Surgeon: Wallene Huh, DPM;  Location: [facility];  Service: Podiatry;  Laterality: Right;  RIGHT AIKENS OSTEOTOMY; RIGHT METATARSAL OSTETOMY Second & Third WITH SCREW; Right Second arthroplasty lesser metatarsalphalangeal with total implant, FUSION Third TOE RIGHT   SPLENECTOMY  07/01/11   TONSILLECTOMY AND ADENOIDECTOMY     WRIST RECONSTRUCTION     left twice    Family History  Problem Relation Age of Onset   Alzheimer's disease Maternal Grandmother    Heart attack Maternal Grandfather    Leukemia Paternal Grandfather    Liver disease Sister    Cirrhosis Sister        primary biliary cirrhosis   Kidney disease Sister    Cancer Sister        breast   Breast cancer Sister    Heart disease Father        rheumatic heart disease    Social History   Socioeconomic History   Marital status: Single    Spouse name: Not on file   Number of children: Not on file   Years of education: Not  on file   Highest education level: Not on file  Occupational History   Not on file  Social Needs   Financial resource strain: Not on file   Food insecurity:    Worry: Not on file    Inability: Not on file   Transportation needs:    Medical: Not on file    Non-medical: Not on file  Tobacco Use   Smoking status: Former Smoker    Packs/day: 0.25    Years: 4.00    Pack years: 1.00    Last attempt to quit: 11/16/2008    Years since quitting: 10.2   Smokeless tobacco: Never Used  Substance and Sexual Activity   Alcohol use: Yes    Alcohol/week: 6.0 standard drinks    Types: 6 Cans of beer per week   Drug use: Yes    Types: Marijuana    Comment: for pain     Sexual activity: Never  Lifestyle   Physical activity:    Days per week: Not on file    Minutes per session: Not on file   Stress: Not on file  Relationships   Social connections:    Talks on phone: Not on file    Gets together: Not on file    Attends religious service: Not on file    Active member of club or organization: Not on file    Attends meetings of clubs or organizations: Not on file    Relationship status: Not on file   Intimate partner violence:    Fear of current or ex partner: Not on file    Emotionally abused: Not on file    Physically abused: Not on file    Forced sexual activity: Not on file  Other Topics Concern   Not on file  Social History Narrative   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     Outpatient Medications Prior to Visit  Medication Sig Dispense Refill   estradiol (VIVELLE-DOT) 0.1 MG/24HR patch APPLY 1 PATCH(0.1 MG) EXTERNALLY TO THE SKIN EVERY 3 DAYS 25 patch 1   famciclovir (FAMVIR) 500 MG tablet Take 3 tablets at onset of symptoms.  Do not take more that 3 tablets per outbreak. 15 tablet 0   fluticasone (FLONASE) 50 MCG/ACT nasal spray SHAKE LIQUID AND USE 2 SPRAYS IN EACH NOSTRIL DAILY AS NEEDED FOR ALLERGIES OR RHINITIS 48 g 1   meloxicam (MOBIC) 15 MG tablet Take 1 tablet by mouth as needed.     PROVENTIL HFA 108 (90 Base) MCG/ACT inhaler INHALE 2 PUFFS INTO THE LUNGS EVERY 6 HOURS AS NEEDED FOR WHEEZING OR SHORTNESS OF BREATH 6.7 g 0   No facility-administered medications prior to visit.     Allergies  Allergen Reactions   Demerol [Meperidine] Nausea And Vomiting   Nucynta [Tapentadol Hydrochloride] Itching   Adhesive [Tape] Rash    Review of Systems  Constitutional: Positive for malaise/fatigue. Negative for fever.  HENT: Positive for congestion.   Eyes: Negative for blurred vision.  Respiratory: Negative for shortness of breath.   Cardiovascular: Negative for chest pain, palpitations  and leg swelling.  Gastrointestinal: Negative for abdominal pain, blood in stool and nausea.  Genitourinary: Negative for dysuria and frequency.  Musculoskeletal: Positive for back pain and joint pain. Negative for falls.  Skin: Negative for rash.  Neurological: Negative for dizziness, loss of consciousness and headaches.  Endo/Heme/Allergies: Negative for environmental allergies.  Psychiatric/Behavioral: Negative for depression. The patient is not nervous/anxious.  Objective:    Physical Exam Constitutional:      Appearance: Normal appearance. She is not ill-appearing.  HENT:     Nose: No congestion or rhinorrhea.  Pulmonary:     Effort: Pulmonary effort is normal.  Neurological:     General: No focal deficit present.     Mental Status: She is alert and oriented to person, place, and time.  Psychiatric:        Mood and Affect: Mood normal.        Behavior: Behavior normal.     Wt 131 lb 1.6 oz (59.5 kg)    BMI 21.16 kg/m  Wt Readings from Last 3 Encounters:  02/22/19 131 lb 1.6 oz (59.5 kg)  06/26/18 136 lb (61.7 kg)  03/21/18 135 lb 12.8 oz (61.6 kg)    Diabetic Foot Exam - Simple   No data filed     Lab Results  Component Value Date   WBC 6.8 03/21/2018   HGB 13.8 03/21/2018   HCT 40.7 03/21/2018   PLT 245.0 03/21/2018   GLUCOSE 91 02/16/2018   CHOL 200 02/16/2018   TRIG 53.0 02/16/2018   HDL 92.80 02/16/2018   LDLCALC 97 02/16/2018   ALT 17 02/16/2018   AST 23 02/16/2018   NA 139 02/16/2018   K 5.1 02/16/2018   CL 103 02/16/2018   CREATININE 0.85 02/16/2018   BUN 10 02/16/2018   CO2 30 02/16/2018   TSH 1.33 02/16/2018   INR 1.1 (H) 08/12/2016    Lab Results  Component Value Date   TSH 1.33 02/16/2018   Lab Results  Component Value Date   WBC 6.8 03/21/2018   HGB 13.8 03/21/2018   HCT 40.7 03/21/2018   MCV 95.7 03/21/2018   PLT 245.0 03/21/2018   Lab Results  Component Value Date   NA 139 02/16/2018   K 5.1 02/16/2018   CO2 30  02/16/2018   GLUCOSE 91 02/16/2018   BUN 10 02/16/2018   CREATININE 0.85 02/16/2018   BILITOT 1.0 02/16/2018   ALKPHOS 48 02/16/2018   AST 23 02/16/2018   ALT 17 02/16/2018   PROT 6.7 02/16/2018   ALBUMIN 4.2 02/16/2018   CALCIUM 10.1 02/16/2018   GFR 72.74 02/16/2018   Lab Results  Component Value Date   CHOL 200 02/16/2018   Lab Results  Component Value Date   HDL 92.80 02/16/2018   Lab Results  Component Value Date   LDLCALC 97 02/16/2018   Lab Results  Component Value Date   TRIG 53.0 02/16/2018   Lab Results  Component Value Date   CHOLHDL 2 02/16/2018   No results found for: HGBA1C     Assessment & Plan:   Problem List Items Addressed This Visit    Spleen absent    Will keep immunizations up to date Reviewed ACIP current guidelines HIB, PCV13/Prevnar, MenB do not require revaccination Meningococcal conjugate (A,C,W,Y) vaccine will require boost at 5 year mark ie 04/06/2021 Pneumovax/PCV23 will require boost at 5 year mark ie 04/06/2021      IBS (irritable bowel syndrome)    Minimize offending foods and manage stress as able      Cough variant asthma    Doing well an staying in more due to pandemic. No changes today, refills given      History of cold sores    Refill for Famvir to use prn         I am having Maya L. Schroll maintain her famciclovir, Proventil HFA,  meloxicam, estradiol, and fluticasone.  No orders of the defined types were placed in this encounter.    I discussed the assessment and treatment plan with the patient. The patient was provided an opportunity to ask questions and all were answered. The patient agreed with the plan and demonstrated an understanding of the instructions.   The patient was advised to call back or seek an in-person evaluation if the symptoms worsen or if the condition fails to improve as anticipated.  I provided 20 minutes of non-face-to-face time during this encounter.   [name], MD

## 2019-02-25 NOTE — Assessment & Plan Note (Addendum)
Will keep immunizations up to date Reviewed ACIP current guidelines HIB, PCV13/Prevnar, MenB do not require revaccination Meningococcal conjugate (A,C,W,Y) vaccine will require boost at 5 year mark ie 04/06/2021 Pneumovax/PCV23 will require boost at 5 year mark ie 04/06/2021

## 2019-02-25 NOTE — Assessment & Plan Note (Signed)
Doing well an staying in more due to pandemic. No changes today, refills given

## 2019-02-25 NOTE — Assessment & Plan Note (Signed)
Minimize offending foods and manage stress as able

## 2019-02-25 NOTE — Assessment & Plan Note (Signed)
Refill for Famvir to use prn

## 2019-02-26 ENCOUNTER — Telehealth: Payer: Self-pay

## 2019-02-26 NOTE — Telephone Encounter (Signed)
Called patient left message for patient to call the office back.  Let patient know that she does not need any vaccines at this time. Per Dr. Charlett Blake, she stated the 2 that she will need DO NOT need to be for another 2 years, her pneumococcal, and the meningococcal are the 2 she would get. She had them 3 years ago and they need to be done every 5 years.   Nurse triage may handle

## 2019-02-26 NOTE — Telephone Encounter (Signed)
Please phone patient as she would like to schedule immunizations in July. She is requesting to schedule several of the vaccines for July. Read your message regarding not needing any vaccines for the next 3 years. However, she is sure she needs at least one annual vaccine.She [place] unsure of which. Apologies for the confusion.

## 2019-02-27 NOTE — Telephone Encounter (Signed)
Notified pt and she voices understanding. 

## 2019-02-27 NOTE — Telephone Encounter (Signed)
I spoke with Annette Snow about this and the the patient. After I spoke with the patient and reviewed all of the latest guidelines (ACIP) and have summarized them in her note. The current guidelines call for boosting 1 of the 2 pneumonia shots and 1 of the 2 meningitis shots but not til the 5 year mark not the 3 year mark we thought was the case. She is not due for anything at this time. Please communicate this with the paitent. The other two shots do not require boosting at all at the current time. These are the current guidelines for asplenic patients

## 2019-02-27 NOTE — Telephone Encounter (Signed)
Spoke with pt and notified her of below recommendation again. Pt is concerned because she states PCP told her at her visit that she was due for 2 vaccines and pt said she will be due for her annual flu vaccine as well. I advised pt that we do not get flu vaccines in until August. Pt states she always gets this in May. I advised her per our record we have not given her a flu vaccine since 2017 and was it possible she has been getting these from her pharmacy. Pt adamant that she does not get her vaccines anywhere but her PCP.  She is concerned in "discrepancy of information" she has received. She states she does not have a spleen and has to make sure vaccines are always current. Advised her I would route to PCP again for double verification. Please review and advise.

## 2019-05-22 ENCOUNTER — Other Ambulatory Visit: Payer: Self-pay | Admitting: Family Medicine

## 2019-05-22 DIAGNOSIS — Z1231 Encounter for screening mammogram for malignant neoplasm of breast: Secondary | ICD-10-CM

## 2019-07-04 ENCOUNTER — Other Ambulatory Visit: Payer: Self-pay | Admitting: Family Medicine

## 2019-07-04 DIAGNOSIS — T7840XS Allergy, unspecified, sequela: Secondary | ICD-10-CM

## 2019-07-05 ENCOUNTER — Other Ambulatory Visit: Payer: Self-pay

## 2019-07-05 ENCOUNTER — Ambulatory Visit
Admission: RE | Admit: 2019-07-05 | Discharge: 2019-07-05 | Disposition: A | Discharge status: Home / Self Care | Payer: BLUE CROSS/BLUE SHIELD | Source: Ambulatory Visit | Attending: Family Medicine | Admitting: Family Medicine

## 2019-07-05 DIAGNOSIS — Z1231 Encounter for screening mammogram for malignant neoplasm of breast: Secondary | ICD-10-CM

## 2019-07-10 ENCOUNTER — Other Ambulatory Visit: Payer: Self-pay | Admitting: Family Medicine

## 2019-10-02 ENCOUNTER — Other Ambulatory Visit: Payer: Self-pay | Admitting: Plastic Surgery

## 2019-12-11 ENCOUNTER — Other Ambulatory Visit: Payer: Self-pay | Admitting: Family Medicine

## 2020-05-10 ENCOUNTER — Other Ambulatory Visit: Payer: Self-pay | Admitting: Family Medicine

## 2021-03-09 ENCOUNTER — Other Ambulatory Visit: Payer: Self-pay | Admitting: Plastic Surgery

## 2023-02-28 ENCOUNTER — Other Ambulatory Visit: Payer: Self-pay | Admitting: Otolaryngology

## 2023-02-28 DIAGNOSIS — R1310 Dysphagia, unspecified: Secondary | ICD-10-CM

## 2023-06-06 ENCOUNTER — Ambulatory Visit (HOSPITAL_BASED_OUTPATIENT_CLINIC_OR_DEPARTMENT_OTHER)
Admission: RE | Admit: 2023-06-06 | Discharge: 2023-06-06 | Disposition: A | Discharge status: Home / Self Care | Payer: 59 | Source: Ambulatory Visit | Attending: Family | Admitting: Family

## 2023-06-06 ENCOUNTER — Ambulatory Visit (INDEPENDENT_AMBULATORY_CARE_PROVIDER_SITE_OTHER): Payer: 59 | Admitting: Family

## 2023-06-06 ENCOUNTER — Encounter: Payer: Self-pay | Admitting: Family

## 2023-06-06 VITALS — BP 115/82 | HR 62 | Temp 97.5°F | Resp 16 | Ht 66.0 in | Wt 128.0 lb

## 2023-06-06 DIAGNOSIS — Z01818 Encounter for other preprocedural examination: Secondary | ICD-10-CM | POA: Insufficient documentation

## 2023-06-06 LAB — COMPREHENSIVE METABOLIC PANEL
ALT: 14 U/L (ref 0–35)
AST: 19 U/L (ref 0–37)
Albumin: 4.3 g/dL (ref 3.5–5.2)
Alkaline Phosphatase: 70 U/L (ref 39–117)
BUN: 13 mg/dL (ref 6–23)
CO2: 31 meq/L (ref 19–32)
Calcium: 9.9 mg/dL (ref 8.4–10.5)
Chloride: 100 meq/L (ref 96–112)
Creatinine, Ser: 0.8 mg/dL (ref 0.40–1.20)
GFR: 77.89 mL/min (ref >60.00)
Glucose, Bld: 83 mg/dL (ref 70–99)
Potassium: 4.7 meq/L (ref 3.5–5.1)
Sodium: 140 meq/L (ref 135–145)
Total Bilirubin: 1 mg/dL (ref 0.2–1.2)
Total Protein: 7 g/dL (ref 6.0–8.3)

## 2023-06-06 LAB — PROTIME-INR
INR: 1 ratio (ref 0.8–1.0)
Prothrombin Time: 10.9 s (ref 9.6–13.1)

## 2023-06-06 LAB — CBC WITH DIFFERENTIAL/PLATELET
Basophils Absolute: 0.1 10*3/uL (ref 0.0–0.1)
Basophils Relative: 1.2 % (ref 0.0–3.0)
Eosinophils Absolute: 0.5 10*3/uL (ref 0.0–0.7)
Eosinophils Relative: 6.7 % — ABNORMAL HIGH (ref 0.0–5.0)
HCT: 41.1 % (ref 36.0–46.0)
Hemoglobin: 13.5 g/dL (ref 12.0–15.0)
Lymphocytes Relative: 33.9 % (ref 12.0–46.0)
Lymphs Abs: 2.3 10*3/uL (ref 0.7–4.0)
MCHC: 32.8 g/dL (ref 30.0–36.0)
MCV: 95.2 fl (ref 78.0–100.0)
Monocytes Absolute: 0.6 10*3/uL (ref 0.1–1.0)
Monocytes Relative: 9.1 % (ref 3.0–12.0)
Neutro Abs: 3.4 10*3/uL (ref 1.4–7.7)
Neutrophils Relative %: 49.1 % (ref 43.0–77.0)
Platelets: 348 10*3/uL (ref 150.0–400.0)
RBC: 4.31 Mil/uL (ref 3.87–5.11)
RDW: 13.7 % (ref 11.5–15.5)
WBC: 6.9 10*3/uL (ref 4.0–10.5)

## 2023-06-06 LAB — APTT: aPTT: 37.2 s — ABNORMAL HIGH (ref 25.4–36.8)

## 2023-06-06 NOTE — Assessment & Plan Note (Signed)
Obtain labs/x-ray as order.  EKG tracing is personally reviewed.  EKG notes NSR.  No acute changes.  Medical clearance pending review of labs.

## 2023-06-06 NOTE — Patient Instructions (Signed)
VISIT SUMMARY:  During your visit today, we discussed your upcoming right knee replacement surgery, your history of spleenectomy, and your past headaches. We also talked about your general health maintenance.  YOUR PLAN:  -PREOPERATIVE EVALUATION FOR RIGHT TOTAL KNEE REPLACEMENT: This is a procedure to replace your damaged knee joint with an artificial one to relieve pain and improve mobility. We have ordered standard preoperative labs, an EKG, and a chest x-ray. It's important to monitor the healing of your leg wound closely and report any signs of infection before your surgery.  -HISTORY OF SPLEENECTOMY: You had your spleen removed due to a complication from a colonoscopy. Currently, there are no issues related to this and we will continue with the current management.  -HEADACHES: You used to have headaches, likely due to hormonal changes, but they have decreased since you stopped taking Estradiol 2-3 years ago. We will continue with the current management.  -GENERAL HEALTH MAINTENANCE: You declined the flu shot. It's important to consider getting vaccinated to protect yourself and others from the flu.  INSTRUCTIONS:  Your knee replacement surgery is scheduled for November 12th. Please ensure to monitor the healing of your leg wound closely and report any signs of infection before your surgery. Continue taking Meloxicam as needed.

## 2023-06-06 NOTE — Progress Notes (Signed)
Subjective:     Patient ID: Annette Snow, female    DOB: 1961-07-11, 62 y.o.   MRN: [id number]  Chief Complaint  Patient presents with   Pre-op Exam    HPI  Discussed the use of AI scribe software for clinical note transcription with the patient, who gave verbal consent to proceed.  History of Present Illness   The patient, with a history of a spleenectomy following a colonoscopy complication, presents for a preoperative evaluation for a right knee replacement surgery. The patient reports instability and pain in the right knee, leading to frequent falls. The patient also has a history of headaches, which have improved since discontinuing Estradiol three years ago. The patient is currently taking Meloxicam as needed.     She is scheduled for a R TKA with Dr. Despina Hick on 08/09/23. She is here today to obtain medical clearance.    She reports that she cut the back of her right leg back in July but it has been healing well.    Health Maintenance Due  Topic Date Due   COVID-19 Vaccine (1) Never done   Meningococcal B Vaccine (1 of 4 - Increased Risk) 01/20/1969   Zoster Vaccines- Shingrix (1 of 2) Never done   MAMMOGRAM  07/04/2021   INFLUENZA VACCINE  04/28/2023    Past Medical History:  Diagnosis Date   Abdominal pain 09/13/2011   Adenomatous colon polyp    Blood transfusion    ECG abnormal 11/19/2011   Endometriosis 09/13/2011   Foot pain, right 09/13/2011   Hemorrhoids    IBS (irritable bowel syndrome)    IBS (irritable bowel syndrome)    Migraines    Nausea without vomiting 07/28/2014   Neuropathy, sacral plexus 09/13/2011   Osgood-Schlatter's disease 09/13/2011   Preventative health care 02/15/2016   Rosacea 02/15/2016   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   Ulcer 1984   petic and duodenal    Past Surgical History:  Procedure Laterality Date   ABDOMINAL HYSTERECTOMY     ANKLE RECONSTRUCTION     left @ age 17   AUGMENTATION MAMMAPLASTY Bilateral    BREAST  ENHANCEMENT SURGERY     BUNIONECTOMY     bilateral   EYE SURGERY  01/23/2012   cosmetic lift, eyelids, b/l   FOOT SURGERY     left twice   FOOT SURGERY Left 05/2017   KNEE ARTHROSCOPY     bilateral  3 on left 2 on right   LAPAROSCOPIC HYSTERECTOMY  2000   total for extensive endometriosis   LAPAROSCOPY     multiple times for endometriosis on all organs   METATARSAL OSTEOTOMY  11/24/2011   Procedure: METATARSAL OSTEOTOMY;  Surgeon: Lenn Sink, DPM;  Location: [facility];  Service: Podiatry;  Laterality: Right;  RIGHT AIKENS OSTEOTOMY; RIGHT METATARSAL OSTETOMY Second & Third WITH SCREW; Right Second arthroplasty lesser metatarsalphalangeal with total implant, FUSION Third TOE RIGHT   SPLENECTOMY  07/01/11   TONSILLECTOMY AND ADENOIDECTOMY     WRIST RECONSTRUCTION     left twice    Family History  Problem Relation Age of Onset   Alzheimer's disease Maternal Grandmother    Heart attack Maternal Grandfather    Leukemia Paternal Grandfather    Liver disease Sister    Cirrhosis Sister        primary biliary cirrhosis   Kidney disease Sister    Cancer Sister        breast   Breast cancer Sister  Heart disease Father        rheumatic heart disease    Social History   Socioeconomic History   Marital status: Single    Spouse name: Not on file   Number of children: Not on file   Years of education: Not on file   Highest education level: Not on file  Occupational History   Not on file  Tobacco Use   Smoking status: Former    Current packs/day: 0.00    Average packs/day: 0.3 packs/day for 4.0 years (1.0 ttl pk-yrs)    Types: Cigarettes    Start date: 11/16/2004    Quit date: 11/16/2008    Years since quitting: 14.5   Smokeless tobacco: Never  Vaping Use   Vaping status: Never Used  Substance and Sexual Activity   Alcohol use: Yes    Alcohol/week: 6.0 standard drinks of alcohol    Types: 6 Cans of beer per week   Drug use: Yes    Types: Marijuana     Comment: for pain   Sexual activity: Never  Other Topics Concern   Not on file  Social History Narrative   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    Social Determinants of Health   Financial Resource Strain: Not on file  Food Insecurity: Not on file  Transportation Needs: Not on file  Physical Activity: Not on file  Stress: Not on file  Social Connections: Not on file  Intimate Partner Violence: Not on file    Outpatient Medications Prior to Visit  Medication Sig Dispense Refill   meloxicam (MOBIC) 15 MG tablet Take 1 tablet by mouth as needed.     estradiol (VIVELLE-DOT) 0.1 MG/24HR patch APPLY 1 PATCH(0.1 MG) EXTERNALLY TO THE SKIN EVERY 3 DAYS 24 patch 1   famciclovir (FAMVIR) 500 MG tablet Take 3 tablets at onset of symptoms.  Do not take more that 3 tablets per outbreak. 15 tablet 0   fluticasone (FLONASE) 50 MCG/ACT nasal spray SHAKE LIQUID AND USE 2 SPRAYS IN EACH NOSTRIL DAILY AS NEEDED FOR ALLERGIES OR RHINITIS 48 g 1   PROVENTIL HFA 108 (90 Base) MCG/ACT inhaler INHALE 2 PUFFS INTO THE LUNGS EVERY 6 HOURS AS NEEDED FOR WHEEZING OR SHORTNESS OF BREATH 6.7 g 0   No facility-administered medications prior to visit.    Allergies  Allergen Reactions   Demerol [Meperidine] Nausea And Vomiting   Nucynta [Tapentadol Hydrochloride] Itching   Adhesive [Tape] Rash    Review of Systems  Constitutional:  Negative for fever.  HENT:  Negative for congestion and hearing loss.   Eyes:  Negative for blurred vision.  Respiratory:  Negative for cough.   Cardiovascular:  Positive for leg swelling (left leg following a fall).  Gastrointestinal:  Negative for constipation and diarrhea.  Genitourinary:  Negative for dysuria, frequency and hematuria.  Musculoskeletal:  Positive for joint pain (right knee). Negative for myalgias.  Skin:  Negative for rash.  Neurological:  Positive for headaches (improved overall, has been a lifelong issue).   Psychiatric/Behavioral:         Denies depression/anxiety       Objective:    Physical Exam Constitutional:      General: She is not in acute distress.    Appearance: Normal appearance. She is well-developed.  HENT:     Head: Normocephalic and atraumatic.     Right Ear: Tympanic membrane, ear canal and external ear normal.     Left Ear:  Tympanic membrane, ear canal and external ear normal.  Eyes:     General: No scleral icterus. Neck:     Thyroid: No thyromegaly.  Cardiovascular:     Rate and Rhythm: Normal rate and regular rhythm.     Heart sounds: Normal heart sounds. No murmur heard. Pulmonary:     Effort: Pulmonary effort is normal. No respiratory distress.     Breath sounds: Normal breath sounds. No wheezing.  Abdominal:     General: There is no distension.     Palpations: Abdomen is soft. There is no mass.     Tenderness: There is no abdominal tenderness. There is no guarding.  Musculoskeletal:     Cervical back: Neck supple.  Skin:    General: Skin is warm and dry.     Comments: 2 scabbed lesions left posterior calf, minimal surrounding erythema. Some swelling of the left ankle.  Neurological:     Mental Status: She is alert and oriented to person, place, and time.  Psychiatric:        Mood and Affect: Mood normal.        Behavior: Behavior normal.        Thought Content: Thought content normal.        Judgment: Judgment normal.      BP 115/82 (BP Location: Right Arm, Patient Position: Sitting, Cuff Size: Small)   Pulse 62   Temp (!) 97.5 F (36.4 C) (Oral)   Resp 16   Ht 5\' 6"  (1.676 m)   Wt 128 lb (58.1 kg)   SpO2 99%   BMI 20.66 kg/m  Wt Readings from Last 3 Encounters:  06/06/23 128 lb (58.1 kg)  02/22/19 131 lb 1.6 oz (59.5 kg)  06/26/18 136 lb (61.7 kg)       Assessment & Plan:   Problem List Items Addressed This Visit       Unprioritized   Pre-op evaluation - Primary    Obtain labs/x-ray as order.  EKG tracing is personally  reviewed.  EKG notes NSR.  No acute changes.  Medical clearance pending review of labs.       Relevant Orders   Comp Met (CMET)   CBC w/Diff   Protime-INR   PTT   Urine Culture   DG Chest 2 View   EKG 12-Lead (Completed)   30 minutes spent on today's visit.   I have discontinued Marylyn L. Snow's famciclovir, Proventil HFA, fluticasone, and estradiol. I am also having her maintain her meloxicam.  No orders of the defined types were placed in this encounter.

## 2023-06-07 LAB — URINE CULTURE
MICRO NUMBER:: 15439178
Result:: NO GROWTH
SPECIMEN QUALITY:: ADEQUATE
